# Patient Record
Sex: Male | Born: 1940 | Race: Black or African American | Hispanic: No | Smoking: Current every day smoker
Health system: Southern US, Community
[De-identification: ages and names within clinical notes are randomized; demographics above are authoritative.]

## PROBLEM LIST (undated history)

## (undated) DIAGNOSIS — R918 Other nonspecific abnormal finding of lung field: Secondary | ICD-10-CM

## (undated) DIAGNOSIS — K219 Gastro-esophageal reflux disease without esophagitis: Secondary | ICD-10-CM

## (undated) DIAGNOSIS — K3189 Other diseases of stomach and duodenum: Secondary | ICD-10-CM

## (undated) DIAGNOSIS — R42 Dizziness and giddiness: Secondary | ICD-10-CM

## (undated) DIAGNOSIS — E119 Type 2 diabetes mellitus without complications: Secondary | ICD-10-CM

## (undated) DIAGNOSIS — I2510 Atherosclerotic heart disease of native coronary artery without angina pectoris: Secondary | ICD-10-CM

## (undated) DIAGNOSIS — R0789 Other chest pain: Secondary | ICD-10-CM

## (undated) DIAGNOSIS — G459 Transient cerebral ischemic attack, unspecified: Secondary | ICD-10-CM

## (undated) DIAGNOSIS — I10 Essential (primary) hypertension: Secondary | ICD-10-CM

## (undated) DIAGNOSIS — Z972 Presence of dental prosthetic device (complete) (partial): Secondary | ICD-10-CM

## (undated) DIAGNOSIS — J449 Chronic obstructive pulmonary disease, unspecified: Secondary | ICD-10-CM

## (undated) HISTORY — PX: EYE SURGERY: SHX253

---

## 2003-12-01 ENCOUNTER — Other Ambulatory Visit: Payer: Self-pay

## 2007-01-16 DIAGNOSIS — E0590 Thyrotoxicosis, unspecified without thyrotoxic crisis or storm: Secondary | ICD-10-CM | POA: Insufficient documentation

## 2009-03-12 ENCOUNTER — Inpatient Hospital Stay: Payer: Self-pay | Admitting: Internal Medicine

## 2010-01-09 ENCOUNTER — Inpatient Hospital Stay: Payer: Self-pay | Admitting: Internal Medicine

## 2010-06-01 ENCOUNTER — Emergency Department: Payer: Self-pay | Admitting: Emergency Medicine

## 2010-12-12 ENCOUNTER — Observation Stay: Payer: Self-pay | Admitting: Internal Medicine

## 2011-06-02 ENCOUNTER — Observation Stay: Payer: Self-pay | Admitting: Internal Medicine

## 2011-06-23 ENCOUNTER — Ambulatory Visit: Payer: Self-pay | Admitting: Internal Medicine

## 2012-08-04 ENCOUNTER — Emergency Department: Payer: Self-pay | Admitting: Unknown Physician Specialty

## 2012-08-04 LAB — COMPREHENSIVE METABOLIC PANEL
Anion Gap: 4 — ABNORMAL LOW (ref 7–16)
BUN: 19 mg/dL — ABNORMAL HIGH (ref 7–18)
Bilirubin,Total: 0.2 mg/dL (ref 0.2–1.0)
Chloride: 102 mmol/L (ref 98–107)
Co2: 30 mmol/L (ref 21–32)
Creatinine: 1.45 mg/dL — ABNORMAL HIGH (ref 0.60–1.30)
EGFR (African American): 56 — ABNORMAL LOW
EGFR (Non-African Amer.): 48 — ABNORMAL LOW
Osmolality: 276 (ref 275–301)
Potassium: 3.2 mmol/L — ABNORMAL LOW (ref 3.5–5.1)
Total Protein: 6.6 g/dL (ref 6.4–8.2)

## 2012-08-04 LAB — CBC
HCT: 41.4 % (ref 40.0–52.0)
MCHC: 31.6 g/dL — ABNORMAL LOW (ref 32.0–36.0)
MCV: 82 fL (ref 80–100)
Platelet: 146 10*3/uL — ABNORMAL LOW (ref 150–440)
RBC: 5.03 10*6/uL (ref 4.40–5.90)
RDW: 13.9 % (ref 11.5–14.5)
WBC: 14.3 10*3/uL — ABNORMAL HIGH (ref 3.8–10.6)

## 2012-08-04 LAB — TROPONIN I: Troponin-I: 0.03 ng/mL

## 2013-04-25 ENCOUNTER — Observation Stay: Payer: Self-pay | Admitting: Internal Medicine

## 2013-04-25 LAB — CBC
HCT: 42.2 % (ref 40.0–52.0)
RBC: 5.22 10*6/uL (ref 4.40–5.90)

## 2013-04-25 LAB — COMPREHENSIVE METABOLIC PANEL
Anion Gap: 8 (ref 7–16)
Bilirubin,Total: 0.5 mg/dL (ref 0.2–1.0)
Calcium, Total: 8.8 mg/dL (ref 8.5–10.1)
Co2: 27 mmol/L (ref 21–32)
Creatinine: 1.9 mg/dL — ABNORMAL HIGH (ref 0.60–1.30)
EGFR (Non-African Amer.): 35 — ABNORMAL LOW
Glucose: 123 mg/dL — ABNORMAL HIGH (ref 65–99)
SGOT(AST): 18 U/L (ref 15–37)
SGPT (ALT): 12 U/L (ref 12–78)
Sodium: 138 mmol/L (ref 136–145)
Total Protein: 7.1 g/dL (ref 6.4–8.2)

## 2013-04-25 LAB — TROPONIN I: Troponin-I: 0.06 ng/mL — ABNORMAL HIGH

## 2013-04-25 LAB — CK TOTAL AND CKMB (NOT AT ARMC): CK, Total: 166 U/L (ref 35–232)

## 2013-04-26 LAB — CK TOTAL AND CKMB (NOT AT ARMC)
CK, Total: 159 U/L (ref 35–232)
CK-MB: 2 ng/mL (ref 0.5–3.6)
CK-MB: 1.4 ng/mL (ref 0.5–3.6)

## 2013-04-26 LAB — LIPID PANEL
Cholesterol: 107 mg/dL (ref 0–200)
Ldl Cholesterol, Calc: 50 mg/dL (ref 0–100)
Triglycerides: 100 mg/dL (ref 0–200)

## 2013-04-26 LAB — BASIC METABOLIC PANEL
BUN: 25 mg/dL — ABNORMAL HIGH (ref 7–18)
Calcium, Total: 9.1 mg/dL (ref 8.5–10.1)
Chloride: 102 mmol/L (ref 98–107)
Co2: 28 mmol/L (ref 21–32)
Creatinine: 1.47 mg/dL — ABNORMAL HIGH (ref 0.60–1.30)
EGFR (African American): 55 — ABNORMAL LOW
Glucose: 95 mg/dL (ref 65–99)
Osmolality: 278 (ref 275–301)
Potassium: 3.5 mmol/L (ref 3.5–5.1)
Sodium: 137 mmol/L (ref 136–145)

## 2013-04-26 LAB — CBC WITH DIFFERENTIAL/PLATELET
Basophil #: 0.1 10*3/uL (ref 0.0–0.1)
HGB: 14.3 g/dL (ref 13.0–18.0)
Lymphocyte %: 28 %
MCH: 27.1 pg (ref 26.0–34.0)
MCHC: 33.9 g/dL (ref 32.0–36.0)
Monocyte #: 0.8 x10 3/mm (ref 0.2–1.0)
Monocyte %: 8.9 %
Neutrophil #: 5.2 10*3/uL (ref 1.4–6.5)
Neutrophil %: 58.1 %
Platelet: 151 10*3/uL (ref 150–440)
WBC: 8.9 10*3/uL (ref 3.8–10.6)

## 2013-04-26 LAB — HEMOGLOBIN A1C: Hemoglobin A1C: 7.5 % — ABNORMAL HIGH (ref 4.2–6.3)

## 2013-04-26 LAB — TROPONIN I: Troponin-I: 0.07 ng/mL — ABNORMAL HIGH

## 2013-10-15 ENCOUNTER — Observation Stay: Payer: Self-pay | Admitting: Internal Medicine

## 2013-10-15 LAB — CBC
HCT: 45.6 % (ref 40.0–52.0)
HGB: 14.5 g/dL (ref 13.0–18.0)
MCH: 26.4 pg (ref 26.0–34.0)
MCHC: 31.9 g/dL — ABNORMAL LOW (ref 32.0–36.0)
MCV: 83 fL (ref 80–100)
Platelet: 164 10*3/uL (ref 150–440)
RBC: 5.51 10*6/uL (ref 4.40–5.90)
RDW: 14.1 % (ref 11.5–14.5)
WBC: 14 10*3/uL — AB (ref 3.8–10.6)

## 2013-10-15 LAB — DIFFERENTIAL
Basophil #: 0.1 10*3/uL (ref 0.0–0.1)
Basophil %: 0.8 %
EOS ABS: 0.3 10*3/uL (ref 0.0–0.7)
EOS PCT: 2.5 %
Lymphocyte #: 4.6 10*3/uL — ABNORMAL HIGH (ref 1.0–3.6)
Lymphocyte %: 32.8 %
MONO ABS: 1.1 x10 3/mm — AB (ref 0.2–1.0)
Monocyte %: 7.5 %
Neutrophil #: 7.9 10*3/uL — ABNORMAL HIGH (ref 1.4–6.5)
Neutrophil %: 56.4 %

## 2013-10-15 LAB — BASIC METABOLIC PANEL
ANION GAP: 4 — AB (ref 7–16)
BUN: 20 mg/dL — AB (ref 7–18)
Calcium, Total: 8.8 mg/dL (ref 8.5–10.1)
Chloride: 99 mmol/L (ref 98–107)
Co2: 29 mmol/L (ref 21–32)
Creatinine: 1.83 mg/dL — ABNORMAL HIGH (ref 0.60–1.30)
EGFR (Non-African Amer.): 36 — ABNORMAL LOW
GFR CALC AF AMER: 42 — AB
Glucose: 138 mg/dL — ABNORMAL HIGH (ref 65–99)
Osmolality: 269 (ref 275–301)
Potassium: 3 mmol/L — ABNORMAL LOW (ref 3.5–5.1)
SODIUM: 132 mmol/L — AB (ref 136–145)

## 2013-10-15 LAB — CK TOTAL AND CKMB (NOT AT ARMC)
CK, TOTAL: 173 U/L (ref 35–232)
CK-MB: 2.4 ng/mL (ref 0.5–3.6)

## 2013-10-15 LAB — TROPONIN I: Troponin-I: 0.1 ng/mL — ABNORMAL HIGH

## 2013-10-16 LAB — BASIC METABOLIC PANEL
ANION GAP: 8 (ref 7–16)
BUN: 21 mg/dL — AB (ref 7–18)
CO2: 24 mmol/L (ref 21–32)
CREATININE: 1.43 mg/dL — AB (ref 0.60–1.30)
Calcium, Total: 8.3 mg/dL — ABNORMAL LOW (ref 8.5–10.1)
Chloride: 101 mmol/L (ref 98–107)
EGFR (African American): 57 — ABNORMAL LOW
EGFR (Non-African Amer.): 49 — ABNORMAL LOW
Glucose: 133 mg/dL — ABNORMAL HIGH (ref 65–99)
OSMOLALITY: 271 (ref 275–301)
POTASSIUM: 4.2 mmol/L (ref 3.5–5.1)
SODIUM: 133 mmol/L — AB (ref 136–145)

## 2013-10-16 LAB — URINALYSIS, COMPLETE
Bilirubin,UR: NEGATIVE
GLUCOSE, UR: NEGATIVE mg/dL (ref 0–75)
KETONE: NEGATIVE
Leukocyte Esterase: NEGATIVE
Nitrite: NEGATIVE
Ph: 6 (ref 4.5–8.0)
Protein: NEGATIVE
RBC,UR: 3 /HPF (ref 0–5)
Specific Gravity: 1.015 (ref 1.003–1.030)
Squamous Epithelial: 1

## 2013-10-16 LAB — LIPID PANEL
CHOLESTEROL: 96 mg/dL (ref 0–200)
HDL Cholesterol: 30 mg/dL — ABNORMAL LOW (ref 40–60)
Ldl Cholesterol, Calc: 50 mg/dL (ref 0–100)
Triglycerides: 81 mg/dL (ref 0–200)
VLDL Cholesterol, Calc: 16 mg/dL (ref 5–40)

## 2013-10-16 LAB — TROPONIN I
TROPONIN-I: 0.09 ng/mL — AB
TROPONIN-I: 0.1 ng/mL — AB

## 2013-10-16 LAB — CK-MB
CK-MB: 2.6 ng/mL (ref 0.5–3.6)
CK-MB: 1.9 ng/mL (ref 0.5–3.6)

## 2013-10-16 LAB — TSH: Thyroid Stimulating Horm: 2.05 u[IU]/mL

## 2014-02-13 DIAGNOSIS — N138 Other obstructive and reflux uropathy: Secondary | ICD-10-CM | POA: Insufficient documentation

## 2014-03-03 DIAGNOSIS — R911 Solitary pulmonary nodule: Secondary | ICD-10-CM | POA: Insufficient documentation

## 2014-05-05 DIAGNOSIS — I951 Orthostatic hypotension: Secondary | ICD-10-CM | POA: Insufficient documentation

## 2014-05-05 DIAGNOSIS — N189 Chronic kidney disease, unspecified: Secondary | ICD-10-CM | POA: Insufficient documentation

## 2014-07-01 DIAGNOSIS — I6523 Occlusion and stenosis of bilateral carotid arteries: Secondary | ICD-10-CM | POA: Insufficient documentation

## 2014-11-14 DIAGNOSIS — E1122 Type 2 diabetes mellitus with diabetic chronic kidney disease: Secondary | ICD-10-CM | POA: Insufficient documentation

## 2014-11-14 DIAGNOSIS — N182 Chronic kidney disease, stage 2 (mild): Secondary | ICD-10-CM | POA: Insufficient documentation

## 2014-11-23 DIAGNOSIS — R351 Nocturia: Secondary | ICD-10-CM | POA: Insufficient documentation

## 2014-12-06 ENCOUNTER — Emergency Department: Payer: Self-pay | Admitting: Emergency Medicine

## 2015-01-09 NOTE — Consult Note (Signed)
PATIENT NAMEWORLEY, [name] MR#:  [id number] DATE OF BIRTH:  [birth year]  DATE OF CONSULTATION:  04/26/2013  REFERRING PHYSICIAN:  Dr. Posey Pronto CONSULTING PHYSICIAN:  Corey Skains, MD  REASON FOR CONSULTATION: Chest pain consistent with unstable angina with diabetes,  coronary artery disease, hypertension and hyperlipidemia.   CHIEF COMPLAINT: "I got weak and fatigued with dizziness."   HISTORY OF PRESENT ILLNESS:  This is a 74 year old male with known hypertension on appropriate medication management as well as hyperlipidemia on a statin. The patient has had diabetes, currently treated, although the concerns of significant compliance. The patient does work very hard and was working through lunch yesterday when he had weakness, fatigue, dizziness and nausea.  When getting home, he continued to be weak and dizzy and had some mild chest pressure. With that chest pressure, he had concerns of coronary artery disease that he has had in the past. When coming to the hospital, he had an EKG showing normal sinus rhythm with nonspecific ST and T wave changes.  There is currently no evidence of elevation of troponin or acute myocardial infarction.  The patient has had continued blood pressure and heart rate control and no worsening of chest discomfort at this time. He has been placed on appropriate medication management for hypertension and diabetes. At this time, the patient's review of systems are negative for syncope, dizziness, nausea, diaphoresis, vision change, ringing in the ears, hearing loss, cough, congestion, heartburn, further nausea, skin lesions, skin rashes, cough, congestion or lower extremity edema.   PAST MEDICAL HISTORY:  1.  Diabetes.  2.  Hypertension.  3.  Hyperlipidemia.  4.  Coronary artery disease.  5.  Lower extremity edema.   FAMILY HISTORY: No family members with early onset of cardiovascular disease or hypertension.   SOCIAL HISTORY: Smokes 1/2 pack per day and denies alcohol  use.   ALLERGIES:  As listed.   MEDICATIONS: As listed.   PHYSICAL EXAMINATION: VITAL SIGNS: Blood pressure is 132/68 bilaterally and heart rate 72 upright and reclining, and regular.  GENERAL: He is a well appearing male in no acute distress.  HEENT: No icterus, thyromegaly, ulcers, hemorrhage or xanthelasma.  HEART:  Regular rate and rhythm with normal S1 and S2 without murmur, gallop or rub. PMI is normal size and placement. Carotid upstroke normal without bruit. Jugular venous pressure is normal.  LUNGS: Clear to auscultation with normal respirations.  ABDOMEN: Soft and nontender without hepatosplenomegaly or masses. Abdominal aorta is normal size without bruit.  EXTREMITIES: 2+ bilateral pulses in dorsal, pedal, radial and femoral arteries without lower extremity edema, cyanosis, clubbing or ulcers.  NEUROLOGIC: He is oriented to time, place and person with normal mood and affect.   ASSESSMENT: This is a 74 year old male with diabetes, hypertension, hyperlipidemia, coronary artery disease and tobacco abuse of which he had new onset of symptoms consistent with unstable angina, currently without evidence of myocardial infarction.   RECOMMENDATIONS: 1.  Continue serial ECG and enzymes to assess for possible myocardial infarction.  2.  If no myocardial infarction, would perform treadmill Myoview to assess exercise tolerance, myocardial ischemia and Canadian class III angina.  3.  Continue aspirin for further risk reduction of coronary artery disease. 4.  Nitroglycerin for further treatment options for chest discomfort.  5.  Echocardiogram for LV systolic dysfunction and valvular heart disease contributing to chest pain and other symptoms.  6.  Diabetes control with a goal Hemoglobin A1c below 7.  7.  Statin therapy for coronary artery disease  and high intensity cholesterol therapy.  8.  Adjustments of medications as necessary after ambulation.  9.  The patient was counseled on the  discontinuation of tobacco abuse. 10.  Further treatment options after above. ____________________________ Corey Skains, MD bjk:sb D: 04/26/2013 08:08:40 ET T: 04/26/2013 08:50:18 ET JOB#: [id number]  cc: Corey Skains, MD, <Dictator> Corey Skains MD ELECTRONICALLY SIGNED 04/26/2013 14:08

## 2015-01-09 NOTE — Discharge Summary (Signed)
PATIENT NAMEZEDRICK, [name] MR#:  [id number] DATE OF BIRTH:  [birth year]  DATE OF ADMISSION:  04/25/2013 DATE OF DISCHARGE:  04/26/2013  PRESENTING COMPLAINT: Chest pain.   DISCHARGE DIAGNOSES:  1. Chest pain, resolved.  2. Heat exhaustion.  3. Hypertension.  4. Type 2 diabetes.   PROCEDURE: Myoview stress test negative for ischemia.   CODE STATUS: Full code.   MEDICATIONS:  1. Metformin 500 b.i.d.  2. ProAir HFA 1 puff as needed. 3. Symbicort 160/4.5 two puffs b.i.d.  4. Norvasc 5 mg p.o. daily.  5. Tramadol 50 mg b.i.d.  6. Acetaminophen/hydrocodone 1 tablet 3 times a day as needed.  7. Hydrochlorothiazide/lisinopril 25/20 one tablet daily.  8. Lovastatin 40 mg daily.  9. Spiriva 18 mcg inhalation once a day.  10. Omeprazole 20 mg daily.  11. Aspirin 81 mg daily.   FOLLOWUP: With your [facility] primary care physician in 1 to 2 weeks.   LABORATORY DATA: Cardiac enzymes: Troponin was 0.05, 0.07, 0.06.  Hemoglobin A1c is 7.5.  Creatinine is 1.47.  CBC within normal limits.   CARDIOLOGY CONSULTATION: Dr. Nehemiah Massed.   BRIEF SUMMARY OF HOSPITAL COURSE: [name] is a 74 year old African-American gentleman with history of diffuse minimal coronary artery disease, hypertension and diabetes, who came in with chest pain and dehydration and was admitted with:   1. Chest pain. The patient has risk factors for coronary artery disease with hypertension and diabetes. His troponin was borderline elevated. No acute EKG changes were noted. Myoview stress test was essentially negative for ischemia. His aspirin, lisinopril and statins were continued. The patient reports he does not take atenolol and did not know the reason why it was discontinued.  2. Hypertension. Stable with lisinopril and Norvasc.  3. Acute on chronic renal failure, appeared secondary to heat exhaustion from working out in the yard for about 8 hours without adequate hydration per patient. The patient's symptoms remarkably improved  after IV fluids were administered.  4. Type 2 diabetes. Resumed metoprolol.  5. Hyperlipidemia. Continued lovastatin.  6. Hospital stay otherwise remained stable.   CODE STATUS: The patient remained a full code.   TIME SPENT: 40 minutes.   ____________________________ Hart Rochester Posey Pronto, MD sap:OSi D: 04/27/2013 06:48:57 ET T: 04/27/2013 07:13:35 ET JOB#: [id number]  cc: Lexany Belknap A. Posey Pronto, MD, <Dictator> [facility] Ilda Basset MD ELECTRONICALLY SIGNED 05/10/2013 5:40

## 2015-01-09 NOTE — H&P (Signed)
PATIENT NAMERYLIE, [name] MR#:  [id number] DATE OF BIRTH:  [birth year]  PRIMARY CARE PHYSICIAN: Dr. Lia F[facility] at [facility].   PRIMARY CARDIOLOGIST: Dr. Clayborn Bigness.   CHIEF COMPLAINT: Chest pain.   REFERRING EMERGENCY ROOM PHYSICIAN: Dr. Thomasene Lot.   HISTORY OF PRESENT ILLNESS: The patient is a 74 year old male with past medical history of: 1.  Diffuse coronary artery disease, minimal.  2.  Chronic obstructive pulmonary disease.  3.  Hypertension.  4.  Hyperlipidemia.  5.  Diabetes.  6.  Hypothyroid.   He was in his usual state of health still today. Today he had eight hours of work which is outdoors and which was in hot weather without eating or drinking enough. In the evening, he felt very weak and tired and he got some rest, but he was still feeling chest pain and was feeling shortness of breath. The pain was on the left side, which was nonradiating, 6 out of 10,  constant, and there was no cough or anything which was making it worse. He decided to come to the Emergency Room, and in the ER after getting some IV fluid, his pain was slightly better. Does not have any pain now. He denies any similar episode in the past. Denies any palpitation or leg edema.   REVIEW OF SYSTEMS:  CONSTITUTIONAL: Negative for fever, fatigue, weakness, pain or weight loss.  EYES: No blurring, double vision, pain or redness.  ENT:  No tinnitus, ear pain, hearing loss or respiratory problems.  RESPIRATORY: Denies any cough, wheezing, hemoptysis.  CARDIOVASCULAR: Denies any orthopnea, edema, arrhythmia, palpitation, but had chest pain.  GASTROINTESTINAL: Did not have nausea, vomiting, diarrhea or abdominal pain.  GENITOURINARY: No dysuria, hematuria or increased frequency.  ENDOCRINE: No heat or cold intolerance.  MUSCULOSKELETAL: No pain or swelling in the joints.  NEUROLOGICAL: No numbness, weakness, dysarthria or tremors.  PSYCHIATRIC: No anxiety, insomnia or bipolar disorder.   PAST MEDICAL HISTORY:  1.  Minimal  diffuse coronary artery disease. No stent. He had cardiac catheterization in the past.  2.  Chronic obstructive pulmonary disease. No need of home oxygen.  3.  Hypertension. Now for the last six months, blood pressure is coming down, and so his primary care is cutting down the blood pressure medication.  4.  Hyperlipidemia.  5.  Diabetes.  6.  Hypothyroidism.   PAST SURGICAL HISTORY: Cardiac catheterization.   SOCIAL HISTORY: He is a current smoker, 5 to 10 cigarettes per day. Denies alcohol use or any drug abuse.   FAMILY HISTORY: Negative for coronary artery disease, stroke or diabetes.   HOME MEDICATIONS:  1.  Tramadol 50 mg two times a day.  2.  ProAir HFA once a day as needed.  3.  Norvasc 5 mg once a day.  4.  Nitroglycerin 0.4 mg sublingual tablet every five minutes. Take 3 times for chest pain.  5.  Metformin 500 mg one tablet orally 2 times a day.  6.  Lovastatin 40 mg once a day.  7.  Lisinopril 20 mg once a day.  8.  Atenolol 75 mg once a day.  9.  Aspirin 81 mg once a day.  10.  Advair Diskus 1 puff two times a day.   PHYSICAL EXAMINATION:  VITAL SIGNS: In the ER, temperature 97.6, pulse rate 78, respirations 20, blood pressure 109/69 and pulse of 99 on room air.  GENERAL: The patient is fully alert and oriented to time, place and person and does not appear in any acute distress.  HEENT: Head and neck atraumatic. Conjunctivae pink. Oral mucosa moist.  NECK: Supple. No JVD.  RESPIRATORY: Bilateral clear and equal air entry.  CARDIOVASCULAR: S1, S2 present, regular. No murmur.  ABDOMEN: Soft, nontender. Bowel sounds present. No organomegaly.  SKIN: No rashes.  EXTREMITIES: Legs: No swelling or edema. Joints: No swelling or tenderness.  NEUROLOGICAL: Power 5/5. Follows commands. No gross abnormalities.   IMPORTANT LABORATORY RESULTS: Glucose 123; BUN 21; creatinine 1.9, which was 1.45 one year ago; sodium 138; potassium 3.4; chloride 103; CO2 of 27; calcium 8.8; troponin  0.06. WBC 11.1, hemoglobin 14.1, platelet count 149 and MCV 81.   ASSESSMENT AND PLAN: A 74 year old male with past history of diffuse minimal coronary artery disease, came with chest pain and appears having slightly dehydrated, acute renal failure.  1.  Chest pain. Monitor on telemetry floor for observation, troponin, aspirin, cardiology consult and echocardiogram.  2.  Acute-on-chronic renal failure. Gentle hydration and monitor.  3.  Hypertension. Currently stable. Will hold lisinopril hydrochlorothiazide due to his renal failure.  4.  Diabetes. Will hold metformin due to renal failure. We will continue insulin sliding scale coverage.  5.  Hyperlipidemia. We will continue lovastatin.  6.  Current smoker. Counseling done for five minutes and nicotine patch offered in the hospital.   TOTAL TIME SPENT: 50 minutes.   CODE STATUS: FULL CODE.     ____________________________ Ceasar Lund Anselm Jungling, MD vgv:np D: 04/25/2013 21:48:56 ET T: 04/25/2013 22:22:24 ET JOB#: [id number]  cc: Ceasar Lund. Anselm Jungling, MD, <Dictator> Vaughan Basta MD ELECTRONICALLY SIGNED 05/07/2013 11:14

## 2015-01-10 NOTE — Discharge Summary (Signed)
PATIENT NAMESHAMMOND, [name] MR#:  [id number] DATE OF BIRTH:  1940-11-25  DATE OF ADMISSION:  10/15/2013 DATE OF DISCHARGE:  10/16/2013  ADMISSION DIAGNOSIS:  Syncope.   DISCHARGE DIAGNOSES: 1.  Presyncope secondary to hypotension.  2.  Elevated troponin, likely due to demand ischemia from hypotension not acute coronary syndrome.  3.  History of hypertension.  4.  Hyponatremia from hydrochlorothiazide. 5.  Acute kidney injury.   CONSULTATIONS:  Cardiology.  IMAGING: 1.  The patient had a carotid ultrasound which showed no hemodynamically significant stenosis. 2.  2-D echocardiogram showed an EF of 60% to 65% without any valvular abnormalities.  TSH was 2.05. Troponin max was 0.10. Troponin at discharge 0.09. Sodium 133, potassium 4.2, chloride 101, bicarb 24, BUN 21, creatinine 1.43 glucose is 133.   HOSPITAL COURSE: This 74 year old male who presented with presyncope. For further details, please refer to the H and P.  1.  Near syncope with lightheadedness and queasiness, likely secondary to vasovagal reaction versus hypovolemia. I suspect this is related more to hypovolemia as the patient had acute kidney injury as well as hyponatremia from his HCTZ, less likely cardiogenic in nature. He hypotensive, which did precipitate his near syncope. His blood pressure is improved. He is off of IV fluids and doing well. His Dopplers showed no significant abnormality. Cardiology was consulted. No further assessment at this time. 2.  Elevated troponin secondary to demand ischemia from hypotension not acute coronary syndrome.  Cardiology consult appreciated. Echocardiogram showed no wall motion abnormalities with a normal ejection fraction. Had a normal stress test in August.  3.  Hypertension, initially hypotensive. Now his blood pressure is elevated. We do not recommend starting HCTZ; however, we did start lisinopril. His creatinine is 1.43, which his BMP should be monitored closely on lisinopril.  4.   Hyponatremia from HCTZ, which is resolving. This is also secondary to hypovolemia.  5.  Acute kidney injury from HCTZ hypovolemia which is resolving.   DISCHARGE MEDICATIONS: 1.  Aspirin 81 mg daily. 2.  Spiriva 18 mcg daily.  3.  Symbicort 2 puffs b.i.d.  4.  Lovastatin 40 mg daily.  5.  Metformin 500 b.i.d.  6.  Omeprazole 20 mg b.i.d.  7.  ProAir 2 puffs 4 times a day.  8.  Lisinopril 20 mg daily.   DISCHARGE DIET: ADA diet, low sodium.   DISCHARGE ACTIVITY: As tolerated.  DISCHARGE FOLLOWUP:  The patient will follow up next week with Paulita Cradle for followup for BMP.     The patient is medically stable for discharge. It should be noted his GFR at discharge is 57, which is tolerable for lisinopril as well as metformin.    ____________________________ Madiline Saffran P. Benjie Karvonen, MD spm:ce D: 10/16/2013 15:05:52 ET T: 10/16/2013 19:00:57 ET JOB#: [id number]  cc: Kordelia Severin P. Benjie Karvonen, MD, <Dictator> Paulita Cradle, PA-C Shenekia Riess P Tyler Robidoux MD ELECTRONICALLY SIGNED 10/18/2013 15:04

## 2015-01-10 NOTE — H&P (Signed)
PATIENT NAMESUMNER, [name] MR#:  [id number] DATE OF BIRTH:  05-27-1941  DATE OF ADMISSION:  10/15/2013  PRIMARY CARE PHYSICIAN: Paulita Cradle, PA-C.   REFERRING PHYSICIAN: Dr. Robet Leu.   CHIEF COMPLAINT: Syncope.   HISTORY OF PRESENT ILLNESS: [name] is a 74 year old African American male with a past medical history of hypertension, hyperlipidemia, diabetes mellitus, COPD. 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 Sat down. Again, when he stood up, continued to have similar symptoms. Concerning this, walked out to the car. The patient continued to have severe symptoms of severe dizziness. The patient's wife walked to him and was concerned about this. Called EMS. At the time of EMS arrival, the patient was found to have a systolic blood pressure in 60s. The patient states the patient had symptoms until the patient came to the Emergency Department and was given 1 liter of IV fluids. The patient responded well to the 1 liter of IV fluids with improvement of the blood pressure to systolic blood pressure to 120s. Denies having any chest pain, palpitations or skipped beats. Denied having previous episodes. The patient denies having any lightheadedness upon standing up. The patient is on hydrochlorothiazide. The patient also stated that was experiencing chest pain on the left side of the chest. The patient was admitted in August 2014 with chest pain. At that time, underwent a stress test which came back to be negative. The patient also states that had left heart cath by Dr. Clayborn Bigness. Could not find the report of it.   PAST MEDICAL HISTORY:  1. Hypertension.  2. Hyperlipidemia.  3. Coronary artery disease, diffuse, minimal.  4. COPD.  5. Diabetes.  6. Hypothyroidism  7. History of vertigo.  8. TIA.  9. Gastroesophageal reflux disease.   PAST SURGICAL HISTORY: Left eye surgery.   ALLERGIES: PERCOCET.   HOME MEDICATIONS:  1.  Symbicort 2 puffs 2 times a day.  2. Spiriva 18 mcg once a day.  3. ProAir 2 puffs 4 times a day.  4. Omeprazole 20 mg 2 times a day.  5. Metformin 500 mg 2 times a day.  6. Lovastatin 40 mg once a day.  7. Hydrochlorothiazide 25 mg once a day.  8. Aspirin 81 mg once a day.   SOCIAL HISTORY: Smokes 1/2 pack a day. Denies drinking alcohol or using illicit drugs. Married, lives with his wife.   FAMILY HISTORY: Negative for cardiovascular disease and stroke.   REVIEW OF SYSTEMS:  CONSTITUTIONAL: Denies any generalized weakness.  EYES: No change in vision.  ENT: No change in hearing. No sore throat, runny nose.  RESPIRATORY: Has some mild cough. No shortness of breath.  CARDIOVASCULAR: Chest pain, currently resolved.  GASTROINTESTINAL: No nausea, vomiting, abdominal pain.  GENITOURINARY: No dysuria or hematuria.  ENDOCRINE: No polyuria or polydipsia.  SKIN: No rash or lesions.  NEUROLOGIC: No weakness or numbness in any part of the body.   PHYSICAL EXAMINATION:  GENERAL: This is a well-built, well-nourished, age-appropriate male lying down in the bed, not in distress.  VITAL SIGNS: Temperature is 97.3, pulse 94, blood pressure 129/69, respiratory rate of 18, oxygen saturation 94% on room air.  HEENT: Head normocephalic, atraumatic. There is no scleral icterus. Conjunctivae normal. Pupils equal and react to light. Extraocular movements are intact. Mucous membranes moist. No pharyngeal erythema.  NECK: Supple. No lymphadenopathy. No JVD. No carotid bruit.  CHEST: Has no focal tenderness.  LUNGS: Bilaterally clear to auscultation.  HEART:  S1, S2 regular. No murmurs are heard. No pedal edema. Pulses 2+.  ABDOMEN: Bowel sounds present. Soft, nontender, nondistended. No hepatosplenomegaly.  SKIN: No rash or lesions.  MUSCULOSKELETAL: Good range of motion in all of the extremities.  NEUROLOGIC: The patient is alert, oriented to place, person and time. Cranial nerves II through XII intact.  Motor 5/5 in upper and lower extremities.   LABORATORIES: Initial troponin 0.10. CK-MB 2.6. BMP: BUN 20, creatinine of 1.83, potassium of 3.   CBC: WBC of 14,000, hemoglobin 14.5. The rest of all of the values are within normal limits.   ASSESSMENT AND PLAN: [name] comes to the Emergency Department after having an episode of syncope, chest pain.  1. Chest pain: Multiple risk factors, age, male gender, continued smoking, hypertension, diabetes mellitus. Admit the patient to a monitored bed. Continue to cycle cardiac enzymes x 3. The patient has a minimal CK-MB. Consider consulting cardiology.  2. Syncope with hypotension: The differential diagnosis orthostatic blood pressure versus arrhythmias versus right-sided myocardial infarction. Will continue with intravenous fluids. Monitor the patient and cycle cardiac enzymes x 3. Will obtain orthostatic blood pressure.  3. Acute renal failure: This could be secondary to acute tubular necrosis. Continue with intravenous fluids and follow up.  4. Hypokalemia: This is from hydrochlorothiazide. Will replace by mouth.  5. Hypotension: Will hold the hydrochlorothiazide. Continue with the losartan.  6. Diabetes mellitus: Will hold the metformin for now. Continue with sliding scale insulin.  7. Continued tobacco use: Counseled with the patient.  8. Chronic obstructive pulmonary disease: No current issues.  9. Keep the patient on deep venous thrombosis prophylaxis with Lovenox.   TIME SPENT: 50 minutes.   ____________________________ Monica Becton, MD pv:gb D: 10/16/2013 03:49:16 ET T: 10/16/2013 04:40:02 ET JOB#: [id number]  cc: Monica Becton, MD, <Dictator> Paulita Cradle, PA-C [facility] Mylik Pro MD ELECTRONICALLY SIGNED 10/27/2013 4:04

## 2015-01-10 NOTE — Consult Note (Signed)
General Aspect 75 year old male that was admitted for near syncope.  States he was at [employer] in [employer].  He tried several times to stand up, got very lightheaded.  He was brought to the emergency room where he was found to have a low blood pressure around 60 systolic.  He received IV fluids and felt better.  Troponin initially elevated at 0.10, ast a troponin of 0.90. EKG negative for acute ST changes.In her  This is thought to represent demand ischemia from hypotension and not acute coronary syndrome.  Patient describes some vague chest pain when he was having the near syncopal episodes.  However, he has not had any further chest pain and feels well currently.  He was on hydrochlorothiazide and this has been held while he is in the hospital.  His creatinine was elevated at 1.83 after IV fluids now improved to 1.4.  White count also elevated at 14, although there's no clinical signs of infection.  Patient states he  has been taking Alka-Seltzer +1-2 a day since early December to prevent flu and colds.Surface echocardiogram basically shows normal structure and function.   Physical Exam:  GEN well developed, no acute distress   HEENT pink conjunctivae   RESP normal resp effort  clear BS   CARD Regular rate and rhythm  No murmur   ABD denies tenderness  soft   EXTR negative edema   SKIN normal to palpation, skin turgor decreased   NEURO cranial nerves intact, motor/sensory function intact   PSYCH alert, A+O to time, place, person, good insight   Review of Systems:  Subjective/Chief Complaint Lightheaded, dizzy, improved   Neurologic: Dizzness  Near syncope   Review of Systems: All other systems were reviewed and found to be negative   Lab Results: Thyroid:  28-Jan-15 05:08   Thyroid Stimulating Hormone 2.05 (0.45-4.50 (International Unit)  ----------------------- Pregnant patients have  different reference  ranges for TSH:  - - - - - - - - - -  Pregnant, first  trimetser:  0.36 - 2.50 uIU/mL)  LabObservation:  28-Jan-15 09:10   OBSERVATION Reason for Test  Cardiology:  28-Jan-15 09:10   Echo Doppler REASON FOR EXAM:     COMMENTS:     PROCEDURE: [facility] - ECHO DOPPLER COMPLETE(TRANSTHOR)  - Oct 16 2013  9:10AM   RESULT: Echocardiogram Report  Patient Name:   [name] Date of Exam: 10/16/2013 Medical Rec #:  [id number]       Custom1: Date of Birth:  08/01/1941    Height:       67.0 in Patient Age:    77 years     Weight:       164.0 lb Patient Gender: M            BSA:          1.86 m??  Indications: Syncope Sonographer:    Sherrie Sport RDCS Referring Phys: Monica Becton  Summary:  1. Left ventricular ejection fraction, by visual estimation, is 60 to  65%. Normal wall motion.  2. Elevated mean left atrial pressure.  3. Impaired relaxation pattern of LV diastolic filling.  4. Normal right ventricular size and systolic function.  5. The left atrium is normal in size and structure.  6. The right atrium is normal in size and structure.  7. Normal aortic valve, without any evidence of aortic stenosis or  insufficiency.  8. The pulmonic valve is trileaflet, without evidence of stenosis or  insufficiency.  9. Mildly increased  left ventricular posterior wall thickness. 2D AND M-MODE MEASUREMENTS (normal ranges within parentheses): Left Ventricle:          Normal IVSd (2D):      1.21 cm (0.7-1.1) LVPWd (2D):1.17 cm (0.7-1.1) Aorta/LA:                  Normal LVIDd (2D):     4.20 cm (3.4-5.7) Aortic Root (2D): 3.20 cm (2.4-3.7) LVIDs (2D):     2.82 cm           Left Atrium (2D): 3.20 cm (1.9-4.0) LV FS (2D):     32.9 %   (>25%) LV EF (2D):     61.7 %  (>50%)                                   Right Ventricle:                                   RVd (2D):        0.81 cm LV DIASTOLIC FUNCTION: MV Peak E: 0.68 m/s E/e' Ratio: 11.80 MV Peak A: 1.10 m/s Decel Time: 197 msec E/A Ratio: 0.61 SPECTRAL DOPPLER ANALYSIS (where applicable): Mitral  Valve: MV P1/2 Time: 57.13 msec MV Area, PHT: 3.85 cm?? Aortic Valve: AoV Max Vel: 1.09 m/s AoV Peak PG: 4.8 mmHg AoV Mean PG: LVOT Vmax: 0.93 m/s LVOT VTI:  LVOT Diameter: 2.00 cm AoV Area, Vmax: 2.68 cm?? AoV Area, VTI:  AoV Area, Vmn: Tricuspid Valve and PA/RV Systolic Pressure: TR Max Velocity: 1.98 m/s RA  Pressure: 5 mmHg RVSP/PASP: 20.6 mmHg Pulmonic Valve: PV Max Velocity: 0.81 m/s PV Max PG: 2.6 mmHg PV Mean PG:  PHYSICIAN INTERPRETATION: Left Ventricle: The left ventricular internal cavity size was normal. LV  posterior wall thickness was mildly increased. Left ventricular ejection  fraction, by visual estimation, is 60 to 65%. Spectral Doppler shows   impaired relaxation pattern of LV diastolic filling. Elevated mean left  atrial pressure. Right Ventricle: Normal right ventricular size, wall thickness, and  systolic function. Left Atrium: The left atrium is normal in size and structure. Right Atrium: The right atrium is normal in size and structure. Pericardium: There is no evidence of pericardial effusion. Mitral Valve: Trace mitral valve regurgitation is seen. Tricuspid Valve: Trivial tricuspid regurgitation is visualized. The  tricuspid regurgitant velocity is 1.98 m/s, and with an assumed right  atrial pressure of 5 mmHg, the estimated right ventricular systolic  pressure is normal at 20.6 mmHg. Aortic Valve: The aortic valve is trileaflet and structurally normal,  with normal leaflet excursion; without any evidence of aortic stenosis or  insufficiency. Pulmonic Valve: Structurally normal pulmonic valve, with normal leaflet  excursion. Aorta: The aortic root is normal in size and structure.  [place] MD Electronically signed by [id number] Neoma Laming MD Signature Date/Time: 10/16/2013/11:37:00 AM  *** Final ***  IMPRESSION: .    Verified By: Emmit Pomfret. Humphrey Rolls, M.D., MD  Routine Chem:  28-Jan-15 00:47   Result Comment TROPONIN - RESULTS VERIFIED BY  REPEAT TESTING.  - PREVIOUS CALL:10/15/2013 AT 2128.Marland KitchenTPL  Result(s) reported on 16 Oct 2013 at 02:27AM.    05:08   Glucose, Serum  133  BUN  21  Creatinine (comp)  1.43  Sodium, Serum  133  Potassium, Serum 4.2  Chloride, Serum 101  CO2, Serum 24  Calcium (Total), Serum  8.3  Anion Gap 8  Osmolality (calc) 271  eGFR (African American)  57  eGFR (Non-African American)  49 (eGFR values <24m/min/1.73 m2 may be an indication of chronic kidney disease (CKD). Calculated eGFR is useful in patients with stable renal function. The eGFR calculation will not be reliable in acutely ill patients when serum creatinine is changing rapidly. It is not useful in  patients on dialysis. The eGFR calculation may not be applicable to patients at the low and high extremes of body sizes, pregnant women, and vegetarians.)  Result Comment TROPONIN - RESULTS VERIFIED BY REPEAT TESTING.  - PREVIOUS CALL:10/15/13 AT 2128..Marland KitchenPL  Result(s) reported on 16 Oct 2013 at 06:20AM.  Cholesterol, Serum 96  Triglycerides, Serum 81  HDL (INHOUSE)  30  VLDL Cholesterol Calculated 16  LDL Cholesterol Calculated 50 (Result(s) reported on 16 Oct 2013 at [facility])  Cardiac:  28-Jan-15 00:47   CPK-MB, Serum 2.6 (Result(s) reported on 16 Oct 2013 at 02:06AM.)  Troponin I  0.10 (0.00-0.05 0.05 ng/mL or less: NEGATIVE  Repeat testing in 3-6 hrs  if clinically indicated. >0.05 ng/mL: POTENTIAL  MYOCARDIAL INJURY. Repeat  testing in 3-6 hrs if  clinically indicated. NOTE: An increase or decrease  of 30% or more on serial  testing suggests a  clinically important change)    05:08   CPK-MB, Serum 1.9 (Result(s) reported on 16 Oct 2013 at 06:16AM.)  Troponin I  0.09 (0.00-0.05 0.05 ng/mL or less: NEGATIVE  Repeat testing in 3-6 hrs  if clinically indicated. >0.05 ng/mL: POTENTIAL  MYOCARDIAL INJURY. Repeat  testing in 3-6 hrs if  clinically indicated. NOTE: An increase or decrease  of 30% or more on serial  testing  suggests a  clinically important change)  Routine UA:  28-Jan-15 04:35   Color (UA) Yellow  Clarity (UA) Clear  Glucose (UA) Negative  Bilirubin (UA) Negative  Ketones (UA) Negative  Specific Gravity (UA) 1.015  Blood (UA) 1+  pH (UA) 6.0  Protein (UA) Negative  Nitrite (UA) Negative  Leukocyte Esterase (UA) Negative (Result(s) reported on 16 Oct 2013 at 04:56AM.)  RBC (UA) 3 /HPF  WBC (UA) <1 /HPF  Bacteria (UA) TRACE  Epithelial Cells (UA) 1 /HPF  Mucous (UA) PRESENT (Result(s) reported on 16 Oct 2013 at 04:56AM.)   Radiology Results: Cardiology:    27-Jan-15 19:53, ED ECG  Ventricular Rate 83  Atrial Rate 83  P-R Interval 186  QRS Duration 84  QT 418  QTc 491  P Axis 68  R Axis 47  T Axis 74  ECG interpretation   Normal sinus rhythm  Prolonged QT  Abnormal ECG  When compared with ECG of 25-Apr-2013 18:03,  Criteria for Septal infarct are no longer Present  ST elevation now present in Anterior leads  ----------unconfirmed----------  Confirmed by OVERREAD, NOT ([id number]), editor PEARSON, BARBARA ([id number]) on 10/16/2013 11:00:16 AM  ED ECG     Percocet: Other, N/V/Diarrhea, Hives  Vital Signs/Nurse's Notes: **Vital Signs.:   28-Jan-15 13:40  Vital Signs Type Routine  Temperature Temperature (F) 97.5  Celsius 36.3  Temperature Source oral  Pulse Pulse 83  Respirations Respirations 18  Systolic BP Systolic BP 1536 Diastolic BP (mmHg) Diastolic BP (mmHg) 81  Mean BP 97  Pulse Ox % Pulse Ox % 95  Pulse Ox Activity Level  At rest  Oxygen Delivery Room Air/ 21 %    Impression 74year old male with near syncope with hypotension, possibly hypovolemic.  Mildly elevated troponins  thought to be demand ischemia from height, hypertension, and not acute coronary syndrome.   Plan 1.  Ambulate and if no other symptoms, consideration for discharge. 2.  Would continue to hold hydrochlorothiazide, wInith early followup with cardiology for consideration of additional blood pressure  medication If indicated. 3.  Further recommendations per Dr. Nehemiah Massed.   Electronic Signatures: Roderic Palau (NP)  (Signed 28-Jan-15 14:05)  Authored: General Aspect/Present Illness, History and Physical Exam, Review of System, Labs, Radiology, Allergies, Vital Signs/Nurse's Notes, Impression/Plan   Last Updated: 28-Jan-15 14:05 by Roderic Palau (NP)

## 2015-02-23 ENCOUNTER — Other Ambulatory Visit: Payer: Self-pay

## 2015-02-23 ENCOUNTER — Encounter: Payer: Self-pay | Admitting: Emergency Medicine

## 2015-02-23 ENCOUNTER — Emergency Department: Payer: Medicare Other

## 2015-02-23 ENCOUNTER — Emergency Department
Admission: EM | Admit: 2015-02-23 | Discharge: 2015-02-23 | Disposition: A | Discharge status: Home / Self Care | Payer: Medicare Other | Attending: Emergency Medicine | Admitting: Emergency Medicine

## 2015-02-23 DIAGNOSIS — S161XXA Strain of muscle, fascia and tendon at neck level, initial encounter: Secondary | ICD-10-CM | POA: Diagnosis not present

## 2015-02-23 DIAGNOSIS — Z720 Tobacco use: Secondary | ICD-10-CM | POA: Diagnosis not present

## 2015-02-23 DIAGNOSIS — E119 Type 2 diabetes mellitus without complications: Secondary | ICD-10-CM | POA: Diagnosis not present

## 2015-02-23 DIAGNOSIS — Y9389 Activity, other specified: Secondary | ICD-10-CM | POA: Diagnosis not present

## 2015-02-23 DIAGNOSIS — Y9289 Other specified places as the place of occurrence of the external cause: Secondary | ICD-10-CM | POA: Diagnosis not present

## 2015-02-23 DIAGNOSIS — I10 Essential (primary) hypertension: Secondary | ICD-10-CM | POA: Insufficient documentation

## 2015-02-23 DIAGNOSIS — R42 Dizziness and giddiness: Secondary | ICD-10-CM | POA: Diagnosis present

## 2015-02-23 DIAGNOSIS — Y998 Other external cause status: Secondary | ICD-10-CM | POA: Insufficient documentation

## 2015-02-23 DIAGNOSIS — X58XXXA Exposure to other specified factors, initial encounter: Secondary | ICD-10-CM | POA: Diagnosis not present

## 2015-02-23 HISTORY — DX: Transient cerebral ischemic attack, unspecified: G45.9

## 2015-02-23 HISTORY — DX: Essential (primary) hypertension: I10

## 2015-02-23 HISTORY — DX: Type 2 diabetes mellitus without complications: E11.9

## 2015-02-23 HISTORY — DX: Atherosclerotic heart disease of native coronary artery without angina pectoris: I25.10

## 2015-02-23 LAB — CBC
HCT: 42.3 % (ref 40.0–52.0)
HEMOGLOBIN: 13.6 g/dL (ref 13.0–18.0)
MCH: 26.4 pg (ref 26.0–34.0)
MCHC: 32.2 g/dL (ref 32.0–36.0)
MCV: 82 fL (ref 80.0–100.0)
Platelets: 139 10*3/uL — ABNORMAL LOW (ref 150–440)
RBC: 5.16 MIL/uL (ref 4.40–5.90)
RDW: 13.8 % (ref 11.5–14.5)
WBC: 9.9 10*3/uL (ref 3.8–10.6)

## 2015-02-23 LAB — BASIC METABOLIC PANEL
Anion gap: 8 (ref 5–15)
BUN: 16 mg/dL (ref 6–20)
CALCIUM: 8.7 mg/dL — AB (ref 8.9–10.3)
CHLORIDE: 98 mmol/L — AB (ref 101–111)
CO2: 27 mmol/L (ref 22–32)
Creatinine, Ser: 1.07 mg/dL (ref 0.61–1.24)
GFR calc Af Amer: >60 mL/min (ref >60)
GFR calc non Af Amer: >60 mL/min (ref >60)
GLUCOSE: 176 mg/dL — AB (ref 65–99)
Potassium: 3.6 mmol/L (ref 3.5–5.1)
Sodium: 133 mmol/L — ABNORMAL LOW (ref 135–145)

## 2015-02-23 LAB — GLUCOSE, CAPILLARY: Glucose-Capillary: 179 mg/dL — ABNORMAL HIGH (ref 65–99)

## 2015-02-23 MED ORDER — HYDROCODONE-ACETAMINOPHEN 5-325 MG PO TABS
ORAL_TABLET | ORAL | Status: AC
  Filled 2015-02-23: qty 1

## 2015-02-23 MED ORDER — HYDROCODONE-ACETAMINOPHEN 5-325 MG PO TABS
ORAL_TABLET | ORAL | Status: AC
  Administered 2015-02-23: 2 via ORAL
  Filled 2015-02-23: qty 1

## 2015-02-23 MED ORDER — HYDROCODONE-ACETAMINOPHEN 5-325 MG PO TABS
2.0000 | ORAL_TABLET | Freq: Once | ORAL | Status: AC
  Administered 2015-02-23: 2 via ORAL

## 2015-02-23 MED ORDER — TRAMADOL HCL 50 MG PO TABS: 50.0000 mg | ORAL_TABLET | Freq: Four times a day (QID) | ORAL | Status: AC | PRN

## 2015-02-23 NOTE — ED Provider Notes (Signed)
[facility] Emergency Department Provider Note     Time seen: ----------------------------------------- 5:58 PM on 02/23/2015 -----------------------------------------    I have reviewed the triage vital signs and the nursing notes.   HISTORY  Chief Complaint Neck Pain and Dizziness    HPI [name] is a 74 y.o. male who presents ER for less side neck pain left-sided head pain, and I will him up from sleep. Describes it as a shooting pain. States been dizzy and off balance of the day.Patient still describing sharp left-sided head and neck pain. Denies any other complaints currently, asking for pain medication to help with the pain as Tylenol is not helping. Patient states moving his neck to the left makes his symptoms worse.    Past Medical History  Diagnosis Date  . Hypertension   . Diabetes mellitus without complication   . Coronary artery disease   . TIA (transient ischemic attack)     There are no active problems to display for this patient.   History reviewed. No pertinent past surgical history.  No current outpatient prescriptions on file.  Allergies Ivp dye and Percocet  No family history on file.  Social History History  Substance Use Topics  . Smoking status: Current Every Day Smoker  . Smokeless tobacco: Not on file  . Alcohol Use: No    Review of Systems Constitutional: Negative for fever. Eyes: Negative for visual changes. ENT: Negative for sore throat. Cardiovascular: Negative for chest pain. Respiratory: Negative for shortness of breath. Gastrointestinal: Negative for abdominal pain, vomiting and diarrhea. Genitourinary: Negative for dysuria. Musculoskeletal: Negative for back pain. Skin: Negative for rash. Neurological: Left-sided head and neck pain  10-point ROS otherwise negative.  ____________________________________________   PHYSICAL EXAM:  VITAL SIGNS: ED Triage Vitals  Enc Vitals Group     BP  02/23/15 1643 190/83 mmHg     Pulse Rate 02/23/15 1643 47     Resp 02/23/15 1643 18     Temp 02/23/15 1643 97.7 F (36.5 C)     Temp Source 02/23/15 1643 Oral     SpO2 02/23/15 1643 95 %     Weight 02/23/15 1643 170 lb (77.111 kg)     Height 02/23/15 1643 5\' 7"  (1.702 m)     Head Cir --      Peak Flow --      Pain Score 02/23/15 1644 8     Pain Loc --      Pain Edu? --      Excl. in [place]? --     Constitutional: Alert and oriented. Well appearing and in no distress. Eyes: Conjunctivae are normal. PERRL. Normal extraocular movements. ENT   Head: Normocephalic and atraumatic.   Nose: No congestion/rhinnorhea.   Mouth/Throat: Mucous membranes are moist.   Neck: No stridor. Hematological/Lymphatic/Immunilogical: No cervical lymphadenopathy. Cardiovascular: Normal rate, regular rhythm. Normal and symmetric distal pulses are present in all extremities. No murmurs, rubs, or gallops. Respiratory: Normal respiratory effort without tachypnea nor retractions. Breath sounds are clear and equal bilaterally. No wheezes/rales/rhonchi. Gastrointestinal: Soft and nontender. No distention. No abdominal bruits. There is no CVA tenderness. Musculoskeletal: Nontender with normal range of motion in all extremities. No joint effusions.  No lower extremity tenderness nor edema. Patient has some scalp and paraspinous tenderness on the left cervical Neurologic:  Normal speech and language. No gross focal neurologic deficits are appreciated. Speech is normal. No gait instability. Skin:  Skin is warm, dry and intact. No rash noted. Psychiatric: Mood and  affect are normal. Speech and behavior are normal. Patient exhibits appropriate insight and judgment.  ____________________________________________    LABS (pertinent positives/negatives)  Labs Reviewed  CBC - Abnormal; Notable for the following:    Platelets 139 (*)    All other components within normal limits  BASIC METABOLIC PANEL -  Abnormal; Notable for the following:    Sodium 133 (*)    Chloride 98 (*)    Glucose, Bld 176 (*)    Calcium 8.7 (*)    All other components within normal limits  GLUCOSE, CAPILLARY - Abnormal; Notable for the following:    Glucose-Capillary 179 (*)    All other components within normal limits  CBG MONITORING, ED    EKG: Interpreted by me. Normal sinus rhythm with frequent PVCs, nonspecific T-wave changes, long QT interval, rate is 92.  ____________________________________________  ED COURSE:  Pertinent labs & imaging results that were available during my care of the patient were reviewed by me and considered in my medical decision making (see chart for details).  We'll check basic labs, CT head and reevaluate. 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  No acute intracranial abnormalities.  ____________________________________________    FINAL ASSESSMENT AND PLAN  Head and neck pain   Plan: Pain seems to be musculoskeletal, worse with movement consistent with either torticollis or some type of cervical strain or sprain. Patient will be given C-spine exercises, as well as pain medication and is encouraged to have close follow-up with his doctor.  Earleen Newport, MD   Earleen Newport, MD 02/23/15 [id number]

## 2015-02-23 NOTE — Discharge Instructions (Signed)
Cervical Strain and Sprain (Whiplash) with Rehab Cervical strain and sprain are injuries that commonly occur with "whiplash" injuries. Whiplash occurs when the neck is forcefully whipped backward or forward, such as during a motor vehicle accident or during contact sports. The muscles, ligaments, tendons, discs, and nerves of the neck are susceptible to injury when this occurs. RISK FACTORS Risk of having a whiplash injury increases if:  Osteoarthritis of the spine.  Situations that make head or neck accidents or trauma more likely.  High-risk sports (football, rugby, wrestling, hockey, auto racing, gymnastics, diving, contact karate, or boxing).  Poor strength and flexibility of the neck.  Previous neck injury.  Poor tackling technique.  Improperly fitted or padded equipment. SYMPTOMS   Pain or stiffness in the front or back of neck or both.  Symptoms may present immediately or up to 24 hours after injury.  Dizziness, headache, nausea, and vomiting.  Muscle spasm with soreness and stiffness in the neck.  Tenderness and swelling at the injury site. PREVENTION  Learn and use proper technique (avoid tackling with the head, spearing, and head-butting; use proper falling techniques to avoid landing on the head).  Warm up and stretch properly before activity.  Maintain physical fitness:  Strength, flexibility, and endurance.  Cardiovascular fitness.  Wear properly fitted and padded protective equipment, such as padded soft collars, for participation in contact sports. PROGNOSIS  Recovery from cervical strain and sprain injuries is dependent on the extent of the injury. These injuries are usually curable in 1 week to 3 months with appropriate treatment.  RELATED COMPLICATIONS   Temporary numbness and weakness may occur if the nerve roots are damaged, and this may persist until the nerve has completely healed.  Chronic pain due to frequent recurrence of  symptoms.  Prolonged healing, especially if activity is resumed too soon (before complete recovery). TREATMENT  Treatment initially involves the use of ice and medication to help reduce pain and inflammation. It is also important to perform strengthening and stretching exercises and modify activities that worsen symptoms so the injury does not get worse. These exercises may be performed at home or with a therapist. For patients who experience severe symptoms, a soft, padded collar may be recommended to be worn around the neck.  Improving your posture may help reduce symptoms. Posture improvement includes pulling your chin and abdomen in while sitting or standing. If you are sitting, sit in a firm chair with your buttocks against the back of the chair. While sleeping, try replacing your pillow with a small towel rolled to 2 inches in diameter, or use a cervical pillow or soft cervical collar. Poor sleeping positions delay healing.  For patients with nerve root damage, which causes numbness or weakness, the use of a cervical traction apparatus may be recommended. Surgery is rarely necessary for these injuries. However, cervical strain and sprains that are present at birth (congenital) may require surgery. MEDICATION   If pain medication is necessary, nonsteroidal anti-inflammatory medications, such as aspirin and ibuprofen, or other minor pain relievers, such as acetaminophen, are often recommended.  Do not take pain medication for 7 days before surgery.  Prescription pain relievers may be given if deemed necessary by your caregiver. Use only as directed and only as much as you need. HEAT AND COLD:   Cold treatment (icing) relieves pain and reduces inflammation. Cold treatment should be applied for 10 to 15 minutes every 2 to 3 hours for inflammation and pain and immediately after any activity that aggravates   your symptoms. Use ice packs or an ice massage.  Heat treatment may be used prior to  performing the stretching and strengthening activities prescribed by your caregiver, physical therapist, or athletic trainer. Use a heat pack or a warm soak. SEEK MEDICAL CARE IF:   Symptoms get worse or do not improve in 2 weeks despite treatment.  New, unexplained symptoms develop (drugs used in treatment may produce side effects). EXERCISES RANGE OF MOTION (ROM) AND STRETCHING EXERCISES - Cervical Strain and Sprain These exercises may help you when beginning to rehabilitate your injury. In order to successfully resolve your symptoms, you must improve your posture. These exercises are designed to help reduce the forward-head and rounded-shoulder posture which contributes to this condition. Your symptoms may resolve with or without further involvement from your physician, physical therapist or athletic trainer. While completing these exercises, remember:   Restoring tissue flexibility helps normal motion to return to the joints. This allows healthier, less painful movement and activity.  An effective stretch should be held for at least 20 seconds, although you may need to begin with shorter hold times for comfort.  A stretch should never be painful. You should only feel a gentle lengthening or release in the stretched tissue. STRETCH- Axial Extensors  Lie on your back on the floor. You may bend your knees for comfort. Place a rolled-up hand towel or dish towel, about 2 inches in diameter, under the part of your head that makes contact with the floor.  Gently tuck your chin, as if trying to make a "double chin," until you feel a gentle stretch at the base of your head.  Hold __________ seconds. Repeat __________ times. Complete this exercise __________ times per day.  STRETCH - Axial Extension   Stand or sit on a firm surface. Assume a good posture: chest up, shoulders drawn back, abdominal muscles slightly tense, knees unlocked (if standing) and feet hip width apart.  Slowly retract your  chin so your head slides back and your chin slightly lowers. Continue to look straight ahead.  You should feel a gentle stretch in the back of your head. Be certain not to feel an aggressive stretch since this can cause headaches later.  Hold for __________ seconds. Repeat __________ times. Complete this exercise __________ times per day. STRETCH - Cervical Side Bend   Stand or sit on a firm surface. Assume a good posture: chest up, shoulders drawn back, abdominal muscles slightly tense, knees unlocked (if standing) and feet hip width apart.  Without letting your nose or shoulders move, slowly tip your right / left ear to your shoulder until your feel a gentle stretch in the muscles on the opposite side of your neck.  Hold __________ seconds. Repeat __________ times. Complete this exercise __________ times per day. STRETCH - Cervical Rotators   Stand or sit on a firm surface. Assume a good posture: chest up, shoulders drawn back, abdominal muscles slightly tense, knees unlocked (if standing) and feet hip width apart.  Keeping your eyes level with the ground, slowly turn your head until you feel a gentle stretch along the back and opposite side of your neck.  Hold __________ seconds. Repeat __________ times. Complete this exercise __________ times per day. RANGE OF MOTION - Neck Circles   Stand or sit on a firm surface. Assume a good posture: chest up, shoulders drawn back, abdominal muscles slightly tense, knees unlocked (if standing) and feet hip width apart.  Gently roll your head down and around from the   back of one shoulder to the back of the other. The motion should never be forced or painful.  Repeat the motion 10-20 times, or until you feel the neck muscles relax and loosen. Repeat __________ times. Complete the exercise __________ times per day. STRENGTHENING EXERCISES - Cervical Strain and Sprain These exercises may help you when beginning to rehabilitate your injury. They may  resolve your symptoms with or without further involvement from your physician, physical therapist, or athletic trainer. While completing these exercises, remember:   Muscles can gain both the endurance and the strength needed for everyday activities through controlled exercises.  Complete these exercises as instructed by your physician, physical therapist, or athletic trainer. Progress the resistance and repetitions only as guided.  You may experience muscle soreness or fatigue, but the pain or discomfort you are trying to eliminate should never worsen during these exercises. If this pain does worsen, stop and make certain you are following the directions exactly. If the pain is still present after adjustments, discontinue the exercise until you can discuss the trouble with your clinician. STRENGTH - Cervical Flexors, Isometric  Face a wall, standing about 6 inches away. Place a small pillow, a ball about 6-8 inches in diameter, or a folded towel between your forehead and the wall.  Slightly tuck your chin and gently push your forehead into the soft object. Push only with mild to moderate intensity, building up tension gradually. Keep your jaw and forehead relaxed.  Hold 10 to 20 seconds. Keep your breathing relaxed.  Release the tension slowly. Relax your neck muscles completely before you start the next repetition. Repeat __________ times. Complete this exercise __________ times per day. STRENGTH- Cervical Lateral Flexors, Isometric   Stand about 6 inches away from a wall. Place a small pillow, a ball about 6-8 inches in diameter, or a folded towel between the side of your head and the wall.  Slightly tuck your chin and gently tilt your head into the soft object. Push only with mild to moderate intensity, building up tension gradually. Keep your jaw and forehead relaxed.  Hold 10 to 20 seconds. Keep your breathing relaxed.  Release the tension slowly. Relax your neck muscles completely  before you start the next repetition. Repeat __________ times. Complete this exercise __________ times per day. STRENGTH - Cervical Extensors, Isometric   Stand about 6 inches away from a wall. Place a small pillow, a ball about 6-8 inches in diameter, or a folded towel between the back of your head and the wall.  Slightly tuck your chin and gently tilt your head back into the soft object. Push only with mild to moderate intensity, building up tension gradually. Keep your jaw and forehead relaxed.  Hold 10 to 20 seconds. Keep your breathing relaxed.  Release the tension slowly. Relax your neck muscles completely before you start the next repetition. Repeat __________ times. Complete this exercise __________ times per day. POSTURE AND BODY MECHANICS CONSIDERATIONS - Cervical Strain and Sprain Keeping correct posture when sitting, standing or completing your activities will reduce the stress put on different body tissues, allowing injured tissues a chance to heal and limiting painful experiences. The following are general guidelines for improved posture. Your physician or physical therapist will provide you with any instructions specific to your needs. While reading these guidelines, remember:  The exercises prescribed by your provider will help you have the flexibility and strength to maintain correct postures.  The correct posture provides the optimal environment for your joints to   work. All of your joints have less wear and tear when properly supported by a spine with good posture. This means you will experience a healthier, less painful body.  Correct posture must be practiced with all of your activities, especially prolonged sitting and standing. Correct posture is as important when doing repetitive low-stress activities (typing) as it is when doing a single heavy-load activity (lifting). PROLONGED STANDING WHILE SLIGHTLY LEANING FORWARD When completing a task that requires you to lean  forward while standing in one place for a long time, place either foot up on a stationary 2- to 4-inch high object to help maintain the best posture. When both feet are on the ground, the low back tends to lose its slight inward curve. If this curve flattens (or becomes too large), then the back and your other joints will experience too much stress, fatigue more quickly, and can cause pain.  RESTING POSITIONS Consider which positions are most painful for you when choosing a resting position. If you have pain with flexion-based activities (sitting, bending, stooping, squatting), choose a position that allows you to rest in a less flexed posture. You would want to avoid curling into a fetal position on your side. If your pain worsens with extension-based activities (prolonged standing, working overhead), avoid resting in an extended position such as sleeping on your stomach. Most people will find more comfort when they rest with their spine in a more neutral position, neither too rounded nor too arched. Lying on a non-sagging bed on your side with a pillow between your knees, or on your back with a pillow under your knees will often provide some relief. Keep in mind, being in any one position for a prolonged period of time, no matter how correct your posture, can still lead to stiffness. WALKING Walk with an upright posture. Your ears, shoulders, and hips should all line up. OFFICE WORK When working at a desk, create an environment that supports good, upright posture. Without extra support, muscles fatigue and lead to excessive strain on joints and other tissues. CHAIR:  A chair should be able to slide under your desk when your back makes contact with the back of the chair. This allows you to work closely.  The chair's height should allow your eyes to be level with the upper part of your monitor and your hands to be slightly lower than your elbows.  Body position:  Your feet should make contact with the  floor. If this is not possible, use a foot rest.  Keep your ears over your shoulders. This will reduce stress on your neck and low back. Document Released: 09/05/2005 Document Revised: 01/20/2014 Document Reviewed: 12/18/2008 [employer] Patient Information 2015 [employer]. This information is not intended to replace advice given to you by your health care provider. Make sure you discuss any questions you have with your health care provider.  

## 2015-02-23 NOTE — ED Notes (Signed)
Pt states around 0600 this am he started having left sided neck pain and and "shooting" pain up the left side of his head they woke him up from sleep, states he has been dizzy and off balance throughout the day

## 2015-11-26 ENCOUNTER — Emergency Department: Payer: Medicare Other

## 2015-11-26 ENCOUNTER — Emergency Department
Admission: EM | Admit: 2015-11-26 | Discharge: 2015-11-26 | Disposition: A | Discharge status: Home / Self Care | Payer: Medicare Other | Attending: Emergency Medicine | Admitting: Emergency Medicine

## 2015-11-26 ENCOUNTER — Encounter: Payer: Self-pay | Admitting: Emergency Medicine

## 2015-11-26 DIAGNOSIS — F17200 Nicotine dependence, unspecified, uncomplicated: Secondary | ICD-10-CM | POA: Insufficient documentation

## 2015-11-26 DIAGNOSIS — E119 Type 2 diabetes mellitus without complications: Secondary | ICD-10-CM | POA: Insufficient documentation

## 2015-11-26 DIAGNOSIS — J069 Acute upper respiratory infection, unspecified: Secondary | ICD-10-CM | POA: Diagnosis not present

## 2015-11-26 DIAGNOSIS — J441 Chronic obstructive pulmonary disease with (acute) exacerbation: Secondary | ICD-10-CM | POA: Diagnosis not present

## 2015-11-26 DIAGNOSIS — R0602 Shortness of breath: Secondary | ICD-10-CM | POA: Diagnosis present

## 2015-11-26 DIAGNOSIS — I10 Essential (primary) hypertension: Secondary | ICD-10-CM | POA: Insufficient documentation

## 2015-11-26 HISTORY — DX: Chronic obstructive pulmonary disease, unspecified: J44.9

## 2015-11-26 LAB — CBC WITH DIFFERENTIAL/PLATELET
BASOS ABS: 0 10*3/uL (ref 0–0.1)
BASOS PCT: 1 %
EOS PCT: 1 %
Eosinophils Absolute: 0.1 10*3/uL (ref 0–0.7)
HCT: 39.6 % — ABNORMAL LOW (ref 40.0–52.0)
Hemoglobin: 13 g/dL (ref 13.0–18.0)
LYMPHS PCT: 14 %
Lymphs Abs: 0.8 10*3/uL — ABNORMAL LOW (ref 1.0–3.6)
MCH: 26.8 pg (ref 26.0–34.0)
MCHC: 32.8 g/dL (ref 32.0–36.0)
MCV: 81.8 fL (ref 80.0–100.0)
MONO ABS: 0.8 10*3/uL (ref 0.2–1.0)
Monocytes Relative: 14 %
Neutro Abs: 4.2 10*3/uL (ref 1.4–6.5)
Neutrophils Relative %: 70 %
PLATELETS: 108 10*3/uL — AB (ref 150–440)
RBC: 4.85 MIL/uL (ref 4.40–5.90)
RDW: 14 % (ref 11.5–14.5)
WBC: 6 10*3/uL (ref 3.8–10.6)

## 2015-11-26 LAB — RAPID INFLUENZA A&B ANTIGENS (ARMC ONLY): INFLUENZA A (ARMC): NEGATIVE

## 2015-11-26 LAB — RAPID INFLUENZA A&B ANTIGENS: Influenza B (ARMC): NEGATIVE

## 2015-11-26 LAB — URINALYSIS COMPLETE WITH MICROSCOPIC (ARMC ONLY)
Bacteria, UA: NONE SEEN
Bilirubin Urine: NEGATIVE
GLUCOSE, UA: NEGATIVE mg/dL
KETONES UR: NEGATIVE mg/dL
Leukocytes, UA: NEGATIVE
NITRITE: NEGATIVE
Protein, ur: NEGATIVE mg/dL
SPECIFIC GRAVITY, URINE: 1.015 (ref 1.005–1.030)
Squamous Epithelial / LPF: NONE SEEN
pH: 8 (ref 5.0–8.0)

## 2015-11-26 LAB — COMPREHENSIVE METABOLIC PANEL
ALT: 13 U/L — ABNORMAL LOW (ref 17–63)
ANION GAP: 5 (ref 5–15)
AST: 21 U/L (ref 15–41)
Albumin: 4 g/dL (ref 3.5–5.0)
Alkaline Phosphatase: 58 U/L (ref 38–126)
BILIRUBIN TOTAL: 0.1 mg/dL — AB (ref 0.3–1.2)
BUN: 16 mg/dL (ref 6–20)
CALCIUM: 8.3 mg/dL — AB (ref 8.9–10.3)
CO2: 29 mmol/L (ref 22–32)
Chloride: 98 mmol/L — ABNORMAL LOW (ref 101–111)
Creatinine, Ser: 1.08 mg/dL (ref 0.61–1.24)
GFR calc Af Amer: >60 mL/min (ref >60)
GLUCOSE: 117 mg/dL — AB (ref 65–99)
POTASSIUM: 3.4 mmol/L — AB (ref 3.5–5.1)
Sodium: 132 mmol/L — ABNORMAL LOW (ref 135–145)
TOTAL PROTEIN: 7.1 g/dL (ref 6.5–8.1)

## 2015-11-26 LAB — LACTIC ACID, PLASMA: LACTIC ACID, VENOUS: 0.7 mmol/L (ref 0.5–2.0)

## 2015-11-26 MED ORDER — SODIUM CHLORIDE 0.9 % IV BOLUS (SEPSIS)
1000.0000 mL | INTRAVENOUS | Status: AC
  Administered 2015-11-26: 1000 mL via INTRAVENOUS

## 2015-11-26 MED ORDER — AZITHROMYCIN 500 MG IV SOLR
500.0000 mg | Freq: Once | INTRAVENOUS | Status: AC
  Administered 2015-11-26: 500 mg via INTRAVENOUS
  Filled 2015-11-26 (×2): qty 500

## 2015-11-26 MED ORDER — AZITHROMYCIN 250 MG PO TABS: ORAL_TABLET | ORAL | Status: DC

## 2015-11-26 MED ORDER — DEXTROSE 5 % IV SOLN
1.0000 g | Freq: Once | INTRAVENOUS | Status: AC
  Administered 2015-11-26: 1 g via INTRAVENOUS
  Filled 2015-11-26: qty 10

## 2015-11-26 MED ORDER — SODIUM CHLORIDE 0.9 % IV BOLUS (SEPSIS)
500.0000 mL | INTRAVENOUS | Status: AC
  Administered 2015-11-26: 500 mL via INTRAVENOUS

## 2015-11-26 MED ORDER — ACETAMINOPHEN 500 MG PO TABS
1000.0000 mg | ORAL_TABLET | Freq: Once | ORAL | Status: AC
  Administered 2015-11-26: 1000 mg via ORAL
  Filled 2015-11-26: qty 2

## 2015-11-26 NOTE — Discharge Instructions (Signed)
You were evaluated for fever and cough and found to have clinical upper respiratory infection. Your exam and evaluation are reassuring in the emergency department today. You're being treated with antibiotic azithromycin.  Return to the emergency department for any worsening condition including trouble breathing, chest pain, dizziness, passing out, or any other symptoms concerning to you.  Follow-up with a physician in 2 days.   Upper Respiratory Infection, Adult Most upper respiratory infections (URIs) are a viral infection of the air passages leading to the lungs. A URI affects the nose, throat, and upper air passages. The most common type of URI is nasopharyngitis and is typically referred to as "the common cold." URIs run their course and usually go away on their own. Most of the time, a URI does not require medical attention, but sometimes a bacterial infection in the upper airways can follow a viral infection. This is called a secondary infection. Sinus and middle ear infections are common types of secondary upper respiratory infections. Bacterial pneumonia can also complicate a URI. A URI can worsen asthma and chronic obstructive pulmonary disease (COPD). Sometimes, these complications can require emergency medical care and may be life threatening.  CAUSES Almost all URIs are caused by viruses. A virus is a type of germ and can spread from one person to another.  RISKS FACTORS You may be at risk for a URI if:   You smoke.   You have chronic heart or lung disease.  You have a weakened defense (immune) system.   You are very young or very old.   You have nasal allergies or asthma.  You work in crowded or poorly ventilated areas.  You work in health care facilities or schools. SIGNS AND SYMPTOMS  Symptoms typically develop 2-3 days after you come in contact with a cold virus. Most viral URIs last 7-10 days. However, viral URIs from the influenza virus (flu virus) can last 14-18  days and are typically more severe. Symptoms may include:   Runny or stuffy (congested) nose.   Sneezing.   Cough.   Sore throat.   Headache.   Fatigue.   Fever.   Loss of appetite.   Pain in your forehead, behind your eyes, and over your cheekbones (sinus pain).  Muscle aches.  DIAGNOSIS  Your health care provider may diagnose a URI by:  Physical exam.  Tests to check that your symptoms are not due to another condition such as:  Strep throat.  Sinusitis.  Pneumonia.  Asthma. TREATMENT  A URI goes away on its own with time. It cannot be cured with medicines, but medicines may be prescribed or recommended to relieve symptoms. Medicines may help:  Reduce your fever.  Reduce your cough.  Relieve nasal congestion. HOME CARE INSTRUCTIONS   Take medicines only as directed by your health care provider.   Gargle warm saltwater or take cough drops to comfort your throat as directed by your health care provider.  Use a warm mist humidifier or inhale steam from a shower to increase air moisture. This may make it easier to breathe.  Drink enough fluid to keep your urine clear or pale yellow.   Eat soups and other clear broths and maintain good nutrition.   Rest as needed.   Return to work when your temperature has returned to normal or as your health care provider advises. You may need to stay home longer to avoid infecting others. You can also use a face mask and careful hand washing to prevent spread  of the virus.  Increase the usage of your inhaler if you have asthma.   Do not use any tobacco products, including cigarettes, chewing tobacco, or electronic cigarettes. If you need help quitting, ask your health care provider. PREVENTION  The best way to protect yourself from getting a cold is to practice good hygiene.   Avoid oral or hand contact with people with cold symptoms.   Wash your hands often if contact occurs.  There is no clear  evidence that vitamin C, vitamin E, echinacea, or exercise reduces the chance of developing a cold. However, it is always recommended to get plenty of rest, exercise, and practice good nutrition.  SEEK MEDICAL CARE IF:   You are getting worse rather than better.   Your symptoms are not controlled by medicine.   You have chills.  You have worsening shortness of breath.  You have brown or red mucus.  You have yellow or brown nasal discharge.  You have pain in your face, especially when you bend forward.  You have a fever.  You have swollen neck glands.  You have pain while swallowing.  You have white areas in the back of your throat. SEEK IMMEDIATE MEDICAL CARE IF:   You have severe or persistent:  Headache.  Ear pain.  Sinus pain.  Chest pain.  You have chronic lung disease and any of the following:  Wheezing.  Prolonged cough.  Coughing up blood.  A change in your usual mucus.  You have a stiff neck.  You have changes in your:  Vision.  Hearing.  Thinking.  Mood. MAKE SURE YOU:   Understand these instructions.  Will watch your condition.  Will get help right away if you are not doing well or get worse.   This information is not intended to replace advice given to you by your health care provider. Make sure you discuss any questions you have with your health care provider.   Document Released: 03/01/2001 Document Revised: 01/20/2015 Document Reviewed: 12/11/2013 Elsevier Interactive Patient Education [employer].

## 2015-11-26 NOTE — ED Notes (Signed)
Pt sleeping, easily aroused. Call bell in reach. Breathing equal and unlabored.

## 2015-11-26 NOTE — ED Notes (Addendum)
Pt presents to the ER from home with complaints of chest pain, fever, cough generalized weakness since yesterday. History of COPD. EMS administered one albuterol treatment pt able to cough up some sputum. Pt talks in complete sentences no respiratory distress noted. [facility] VS: 100.74F, 99 pulse, 149/98 BP, 20 respirations.

## 2015-11-26 NOTE — ED Notes (Signed)
X-ray at bedside

## 2015-11-26 NOTE — ED Provider Notes (Signed)
[facility] Emergency Department Provider Note  Time seen: 6:45 AM  I have reviewed the triage vital signs and the nursing notes.   HISTORY  Chief Complaint Shortness of Breath; Cough; and Chest Pain    HPI [name] is a 75 y.o. male with a past medical history of COPD, hypertension, diabetes, TIA, presents to the emergency department with cough and fever. According to the patient for the past 3 days he has been coughing with occasional sputum production. Since yesterday he has been having fevers, 100.4 at home. States generalized weakness. Upon arrival to the emergency department, patient is able to speak normally, slightly tachypnea, no acute distress. Patient does have a frequent cough with occasional sputum production in the emergency department. Patient has a history of COPD but does not wear oxygen. States mild shortness of breath. Denies chest pain. Denies abdominal pain.   Past Medical History  Diagnosis Date  . Hypertension   . Diabetes mellitus without complication ([place])   . Coronary artery disease   . TIA (transient ischemic attack)   . COPD (chronic obstructive pulmonary disease) (HCC)     There are no active problems to display for this patient.   History reviewed. No pertinent past surgical history.  Current Outpatient Rx  Name  Route  Sig  Dispense  Refill  . traMADol (ULTRAM) 50 MG tablet   Oral   Take 1 tablet (50 mg total) by mouth every 6 (six) hours as needed.   20 tablet   0     Allergies Ivp dye and Percocet  No family history on file.  Social History Social History  Substance Use Topics  . Smoking status: Current Every Day Smoker  . Smokeless tobacco: None  . Alcohol Use: No    Review of Systems Constitutional: Positive for fever. Cardiovascular: Negative for chest pain. Respiratory: Mild shortness of breath. Positive for cough with sputum production Gastrointestinal: Negative for abdominal  pain Musculoskeletal: Negative for back pain. Neurological: Mild headache 10-point ROS otherwise negative.  ____________________________________________   PHYSICAL EXAM:  VITAL SIGNS: ED Triage Vitals  Enc Vitals Group     BP 11/26/15 0638 168/90 mmHg     Pulse Rate 11/26/15 0638 105     Resp 11/26/15 0638 22     Temp 11/26/15 0638 98.7 F (37.1 C)     Temp Source 11/26/15 0638 Oral     SpO2 11/26/15 0638 96 %     Weight 11/26/15 0638 164 lb (74.39 kg)     Height 11/26/15 0638 5\' 7"  (1.702 m)     Head Cir --      Peak Flow --      Pain Score 11/26/15 0638 8     Pain Loc --      Pain Edu? --      Excl. in [place]? --     Constitutional: Alert and oriented. Well appearing and in no distress. Eyes: Normal exam ENT   Head: Normocephalic and atraumatic.   Mouth/Throat: Mucous membranes are moist. Cardiovascular: Regular rhythm, rate around 110 bpm. No murmur. Respiratory: Mild tachypnea. No retractions. Breath sounds are clear. Frequent cough during examination Gastrointestinal: Soft and nontender. No distention.  Musculoskeletal: Nontender with normal range of motion in all extremities. No lower extremity tenderness or edema. Neurologic:  Normal speech and language. No gross focal neurologic deficits Skin:  Skin is warm, dry and intact.  Psychiatric: Mood and affect are normal. Speech and behavior are normal.   ____________________________________________  EKG  EKG reviewed and interpreted by myself shows sinus tachycardia 105 bpm, narrow QRS, normal axis, normal intervals, inferolateral ST depressions. No lateral ST changes appear more pronounced than prior EKGs 03-09-2016. Inferior ST changes appear new compared 03/09/16.  ____________________________________________    RADIOLOGY  Chest x-ray shows no acute abnormality  ____________________________________________     INITIAL IMPRESSION / ASSESSMENT AND PLAN / ED COURSE  Pertinent labs & imaging results that  were available during my care of the patient were reviewed by me and considered in my medical decision making (see chart for details).  Patient presents to the emergency department with 3 days of cough, now with generalized weakness and fever. Per EMS vitals patient has a temperature 100.3, heart rate of 99. In the emergency department patient has a heart rate of 105, respiratory rate of 22, frequent cough in the emergency department. I ordered code sepsis protocols for the patient. Suspect likely pneumonia we will start broad-spectrum antibiotics, check labs, cultures, chest x-ray and closely monitor in the emergency department.  Patient's EKG appears to show increased ST changes in the inferolateral leads, could be a result of demand ischemia, labs pending.  No acute changes on chest x-ray. CBC shows normal results, other labs pending. Overall the patient appears quite well at this time. Patient care signed out to Dr. Reita Cliche.  ____________________________________________   FINAL CLINICAL IMPRESSION(S) / ED DIAGNOSES  Upper respiratory infection   Harvest Norberto, MD 11/26/15 [phone]

## 2015-11-26 NOTE — ED Provider Notes (Signed)
[facility]  I accepted care from Dr. Kerman Passey ____________________________________________    LABS (pertinent positives/negatives)  Urinalysis 6-30 red blood cells and otherwise negative Lactate 0.7 Rapid flu negative Comprehensive metabolic panel significant for sodium 132, potassium 3.4, chloride 98 and otherwise without significant abnormality White blood count 6.0 with no left shift. Hemoglobin 13.0 and platelet count 108   ____________________________________________    RADIOLOGY All xrays were viewed by me. Imaging interpreted by radiologist.  Chest x-ray portable: Emphysematous changes in the lungs. No evidence of active pulmonary disease.  ____________________________________________   PROCEDURES  Procedure(s) performed: None  Critical Care performed: None  ____________________________________________   INITIAL IMPRESSION / ASSESSMENT AND PLAN / ED COURSE   Pertinent labs & imaging results that were available during my care of the patient were reviewed by me and considered in my medical decision making (see chart for details).  Patient was started on sepsis protocol, and during his evaluation, he has remained well appearing overall. His heart rate did come back down into the 90s. His laboratory studies are all reassuring. Patient clinically is reassuring. He did receive Rocephin and azithromycin by IV, and will be discharged home with 4 more days of azithromycin.  CONSULTATIONS: None    Patient / Family / Caregiver informed of clinical course, medical decision-making process, and agree with plan.   I discussed return precautions, follow-up instructions, and discharged instructions with patient and/or family.     ____________________________________________   FINAL CLINICAL IMPRESSION(S) / ED DIAGNOSES  Final diagnoses:  Upper respiratory infection        Lisa Roca, MD 11/26/15 [phone]

## 2015-11-26 NOTE — ED Notes (Signed)
Given report to [place], Therapist, sports

## 2015-11-26 NOTE — ED Notes (Signed)
Pt using the phone to call a ride to pick him up.

## 2015-11-26 NOTE — ED Notes (Signed)
Pt resting quielty. Call bell in reach. No complaints at this time. Will continue to assess.

## 2015-11-28 ENCOUNTER — Emergency Department: Payer: Medicare Other

## 2015-11-28 ENCOUNTER — Emergency Department
Admission: EM | Admit: 2015-11-28 | Discharge: 2015-11-28 | Disposition: A | Discharge status: Home / Self Care | Payer: Medicare Other | Attending: Emergency Medicine | Admitting: Emergency Medicine

## 2015-11-28 ENCOUNTER — Encounter: Payer: Self-pay | Admitting: Emergency Medicine

## 2015-11-28 DIAGNOSIS — J449 Chronic obstructive pulmonary disease, unspecified: Secondary | ICD-10-CM | POA: Insufficient documentation

## 2015-11-28 DIAGNOSIS — F17210 Nicotine dependence, cigarettes, uncomplicated: Secondary | ICD-10-CM | POA: Diagnosis not present

## 2015-11-28 DIAGNOSIS — I2510 Atherosclerotic heart disease of native coronary artery without angina pectoris: Secondary | ICD-10-CM | POA: Insufficient documentation

## 2015-11-28 DIAGNOSIS — R05 Cough: Secondary | ICD-10-CM | POA: Diagnosis present

## 2015-11-28 DIAGNOSIS — J209 Acute bronchitis, unspecified: Secondary | ICD-10-CM | POA: Insufficient documentation

## 2015-11-28 DIAGNOSIS — R509 Fever, unspecified: Secondary | ICD-10-CM | POA: Insufficient documentation

## 2015-11-28 DIAGNOSIS — Z8673 Personal history of transient ischemic attack (TIA), and cerebral infarction without residual deficits: Secondary | ICD-10-CM | POA: Insufficient documentation

## 2015-11-28 DIAGNOSIS — I10 Essential (primary) hypertension: Secondary | ICD-10-CM | POA: Insufficient documentation

## 2015-11-28 DIAGNOSIS — Z7984 Long term (current) use of oral hypoglycemic drugs: Secondary | ICD-10-CM | POA: Insufficient documentation

## 2015-11-28 DIAGNOSIS — Z79899 Other long term (current) drug therapy: Secondary | ICD-10-CM | POA: Insufficient documentation

## 2015-11-28 DIAGNOSIS — R0981 Nasal congestion: Secondary | ICD-10-CM | POA: Insufficient documentation

## 2015-11-28 DIAGNOSIS — E119 Type 2 diabetes mellitus without complications: Secondary | ICD-10-CM | POA: Diagnosis not present

## 2015-11-28 LAB — RAPID INFLUENZA A&B ANTIGENS
Influenza A (ARMC): NEGATIVE
Influenza B (ARMC): NEGATIVE

## 2015-11-28 LAB — CBC WITH DIFFERENTIAL/PLATELET
Basophils Absolute: 0 10*3/uL (ref 0–0.1)
EOS ABS: 0 10*3/uL (ref 0–0.7)
HCT: 37.1 % — ABNORMAL LOW (ref 40.0–52.0)
HEMOGLOBIN: 12.1 g/dL — AB (ref 13.0–18.0)
Lymphocytes Relative: 34 %
Lymphs Abs: 1.1 10*3/uL (ref 1.0–3.6)
MCH: 26.7 pg (ref 26.0–34.0)
MCHC: 32.6 g/dL (ref 32.0–36.0)
MCV: 81.9 fL (ref 80.0–100.0)
Monocytes Absolute: 0.4 10*3/uL (ref 0.2–1.0)
Neutro Abs: 1.8 10*3/uL (ref 1.4–6.5)
PLATELETS: 95 10*3/uL — AB (ref 150–440)
RBC: 4.53 MIL/uL (ref 4.40–5.90)
RDW: 14.3 % (ref 11.5–14.5)
WBC: 3.3 10*3/uL — AB (ref 3.8–10.6)

## 2015-11-28 LAB — COMPREHENSIVE METABOLIC PANEL
ALK PHOS: 49 U/L (ref 38–126)
ALT: 20 U/L (ref 17–63)
AST: 34 U/L (ref 15–41)
Albumin: 3.4 g/dL — ABNORMAL LOW (ref 3.5–5.0)
Anion gap: 4 — ABNORMAL LOW (ref 5–15)
BUN: 16 mg/dL (ref 6–20)
CALCIUM: 7.7 mg/dL — AB (ref 8.9–10.3)
CO2: 29 mmol/L (ref 22–32)
CREATININE: 1.18 mg/dL (ref 0.61–1.24)
Chloride: 99 mmol/L — ABNORMAL LOW (ref 101–111)
GFR, EST NON AFRICAN AMERICAN: 59 mL/min — AB (ref >60)
Glucose, Bld: 103 mg/dL — ABNORMAL HIGH (ref 65–99)
Potassium: 3.3 mmol/L — ABNORMAL LOW (ref 3.5–5.1)
Sodium: 132 mmol/L — ABNORMAL LOW (ref 135–145)
Total Bilirubin: 0.5 mg/dL (ref 0.3–1.2)
Total Protein: 6 g/dL — ABNORMAL LOW (ref 6.5–8.1)

## 2015-11-28 LAB — URINE CULTURE: CULTURE: NO GROWTH

## 2015-11-28 MED ORDER — METHYLPREDNISOLONE SODIUM SUCC 125 MG IJ SOLR
125.0000 mg | Freq: Once | INTRAMUSCULAR | Status: AC
  Administered 2015-11-28: 125 mg via INTRAVENOUS
  Filled 2015-11-28: qty 2

## 2015-11-28 MED ORDER — PREDNISONE 10 MG (21) PO TBPK: 10.0000 mg | ORAL_TABLET | Freq: Every day | ORAL | Status: DC

## 2015-11-28 MED ORDER — HYDROCOD POLST-CPM POLST ER 10-8 MG/5ML PO SUER: 5.0000 mL | Freq: Two times a day (BID) | ORAL | Status: DC

## 2015-11-28 MED ORDER — SODIUM CHLORIDE 0.9 % IV BOLUS (SEPSIS)
500.0000 mL | Freq: Once | INTRAVENOUS | Status: AC
  Administered 2015-11-28: 500 mL via INTRAVENOUS

## 2015-11-28 MED ORDER — IPRATROPIUM-ALBUTEROL 0.5-2.5 (3) MG/3ML IN SOLN
3.0000 mL | Freq: Once | RESPIRATORY_TRACT | Status: AC
  Administered 2015-11-28: 3 mL via RESPIRATORY_TRACT
  Filled 2015-11-28: qty 3

## 2015-11-28 NOTE — Discharge Instructions (Signed)

## 2015-11-28 NOTE — ED Notes (Signed)
Discussed discharge instructions, prescriptions, and follow-up care with patient and family members, with pt's permission. No questions or concerns at this time. Pt stable at discharge.

## 2015-11-28 NOTE — ED Notes (Signed)
Pt to ed with c/o cough, congestion, fever x 4 days.  Pt states he was seen here on Thursday for same, started on abx but states he does not feel better.

## 2015-11-28 NOTE — ED Notes (Signed)
Pt able to ambulate in room off oxygen with O2 sat 94-96%. No c/o shortness of breath or pain.

## 2015-11-28 NOTE — ED Provider Notes (Signed)
Time Seen: Approximately 1415 I have reviewed the triage notes  Chief Complaint: Cough; Nasal Congestion; and Fever   History of Present Illness: [name] is a 75 y.o. male *who presents with still a persistent cough, nasal congestion, and describes low-grade fevers at home over the last 4 days. Patient was evaluated here on March 9 and was prescribed outpatient antibiotics. Initial flu testing was negative and the patient was discharged on a Zithromax. He was brought back by his family due to generalized weakness and a frontal headache. He did have some mildly low pulse oximetry in the triage area and was placed on O2 nasal cannula. His cough is still remained dry and nonproductive. He states he has some right lower chest discomfort versus right upper quadrant pain with deep inspiration. He denies any focal weakness. He denies any thick yellow to green tinged nasal drainage. Past Medical History  Diagnosis Date  . Hypertension   . Diabetes mellitus without complication ([place])   . Coronary artery disease   . TIA (transient ischemic attack)   . COPD (chronic obstructive pulmonary disease) (HCC)     There are no active problems to display for this patient.   History reviewed. No pertinent past surgical history.  History reviewed. No pertinent past surgical history.  Current Outpatient Rx  Name  Route  Sig  Dispense  Refill  . amLODipine (NORVASC) 5 MG tablet   Oral   Take 5 mg by mouth daily.         Marland Kitchen atorvastatin (LIPITOR) 40 MG tablet   Oral   Take 40 mg by mouth daily.         Marland Kitchen azithromycin (ZITHROMAX) 250 MG tablet      One tab daily   4 each   0   . gabapentin (NEURONTIN) 100 MG capsule   Oral   Take 100 mg by mouth once.         Marland Kitchen lisinopril (PRINIVIL,ZESTRIL) 20 MG tablet   Oral   Take 20 mg by mouth daily.         Marland Kitchen lovastatin (MEVACOR) 40 MG tablet   Oral   Take 40 mg by mouth at bedtime.         . metFORMIN (GLUCOPHAGE) 500 MG tablet    Oral   Take by mouth 2 (two) times daily with a meal.         . omeprazole (PRILOSEC) 20 MG capsule   Oral   Take 20 mg by mouth daily.         . silodosin (RAPAFLO) 8 MG CAPS capsule   Oral   Take 8 mg by mouth daily with breakfast.         . traMADol (ULTRAM) 50 MG tablet   Oral   Take 1 tablet (50 mg total) by mouth every 6 (six) hours as needed.   20 tablet   0     Allergies:  Ivp dye and Percocet  Family History: History reviewed. No pertinent family history.  Social History: Social History  Substance Use Topics  . Smoking status: Current Every Day Smoker  . Smokeless tobacco: None  . Alcohol Use: No     Review of Systems:   10 point review of systems was performed and was otherwise negative:  Constitutional: No fever Eyes: No visual disturbances ENT: No sore throat, ear pain Cardiac: Right lower pleuritic pain Respiratory: No shortness of breath, wheezing, or stridor Abdomen: No abdominal pain, no vomiting, No  diarrhea Endocrine: No weight loss, No night sweats Extremities: No peripheral edema, cyanosis Skin: No rashes, easy bruising Neurologic: No focal weakness, trouble with speech or swollowing Urologic: No dysuria, Hematuria, or urinary frequency *  Physical Exam:  ED Triage Vitals  Enc Vitals Group     BP 11/28/15 1311 121/75 mmHg     Pulse Rate 11/28/15 1311 100     Resp 11/28/15 1311 20     Temp 11/28/15 1311 98.5 F (36.9 C)     Temp Source 11/28/15 1311 Oral     SpO2 11/28/15 1311 100 %     Weight 11/28/15 1311 164 lb (74.39 kg)     Height --      Head Cir --      Peak Flow --      Pain Score 11/28/15 1312 0     Pain Loc --      Pain Edu? --      Excl. in [place]? --     General: Awake , Alert , and Oriented times 3; GCS 15 Head: Normal cephalic , atraumatic Eyes: Pupils equal , round, reactive to light Nose/Throat: No nasal drainage, patent upper airway without erythema or exudate.Dry mucous membranes  Neck: Supple, Full  range of motion, No anterior adenopathy or palpable thyroid masses. No meningeal signs Lungs: Clear to ascultation without wheezes , rhonchi, or rales Heart: Regular rate, regular rhythm without murmurs , gallops , or rubs Abdomen: Soft, non tender without rebound, guarding , or rigidity; bowel sounds positive and symmetric in all 4 quadrants. No organomegaly .        Extremities: 2 plus symmetric pulses. No edema, clubbing or cyanosis Neurologic: normal ambulation, Motor symmetric without deficits, sensory intact Skin: warm, dry, no rashes   Labs:   All laboratory work was reviewed including any pertinent negatives or positives listed below:  Labs Reviewed  RAPID INFLUENZA A&B ANTIGENS ([place])  COMPREHENSIVE METABOLIC PANEL  CBC WITH DIFFERENTIAL/PLATELET  Reviewed the patient's laboratory work shows no significant findings  EKG: Pending   Radiology:     EXAM: CT HEAD WITHOUT CONTRAST  TECHNIQUE: Contiguous axial images were obtained from the base of the skull through the vertex without intravenous contrast.  COMPARISON: CT head without contrast 02/23/2015 and 06/01/2011.  FINDINGS: Remote lacunar infarcts are again noted adjacent to the caudate head and within the external capsule on the left. No acute cortical infarct, hemorrhage, or mass lesion is present. Mild generalized atrophy is stable. The ventricles are proportionate to the degree of atrophy. No significant extra-axial fluid collection is present.  Bilateral nasal surgery is again noted. There is persistent opacification of the sphenoid sinuses and scattered ethmoid disease. The mastoid air cells are clear. The calvarium is otherwise intact.  IMPRESSION: 1. No acute intracranial abnormality or significant interval change. 2. 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 3. Stable atrophy. 4. Sinus surgery with residual sphenoid and ethmoid  disease.   Electronically Signed By: San Morelle M.D. On: 11/28/2015 14:45          DG Chest 2 View (Final result) Result time: 11/28/15 14:39:05   Final result by Rad Results In Interface (11/28/15 14:39:05)   Narrative:   CLINICAL DATA: Cough and congestion. Fever 4 days.  EXAM: CHEST - 2 VIEW  COMPARISON: One-view chest x-ray 10/15/2013. CT of the chest 06/03/2011  FINDINGS: Heart size is normal. Atherosclerotic calcifications are present at the aortic arch. 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 There is no focal airspace disease. There is no edema or effusion. The visualized soft tissues and bony thorax are unremarkable.  IMPRESSION: 1. No acute cardiopulmonary disease. 2. Atherosclerosis. 3. Left-sided Bochdalek's hernia.   Electronically Signed       I personally reviewed the radiologic studies    ED Course: * Patient was given some IV fluids for what was interpreted be some dehydration. He still slightly hypoxic with wheezing and has a history of COPD. Patient will be given a DuoNeb and IV steroids here in emergency department to see if there is improvement of his pulse oximetry. If the patient doesn't show symptomatic improvement he may require inpatient management.    Assessment:  Acute bronchitis with bronchospasm      Plan: Monitor patient posttreatment be checked out to my colleague Dr. Laveda Abbe, MD 11/28/15 [phone]

## 2015-11-28 NOTE — ED Provider Notes (Signed)
Patient is ambulated in the hall well with no complaints of shortness breath or pain. Patient is agreeable to going home with cough medications and steroids.  Earleen Newport, MD 11/28/15 [phone]

## 2015-12-01 LAB — CULTURE, BLOOD (ROUTINE X 2)
CULTURE: NO GROWTH
Culture: NO GROWTH

## 2016-05-03 ENCOUNTER — Encounter: Payer: Self-pay | Admitting: Emergency Medicine

## 2016-05-03 ENCOUNTER — Emergency Department
Admission: EM | Admit: 2016-05-03 | Discharge: 2016-05-03 | Disposition: A | Discharge status: Home / Self Care | Payer: Medicare Other | Attending: Emergency Medicine | Admitting: Emergency Medicine

## 2016-05-03 DIAGNOSIS — J449 Chronic obstructive pulmonary disease, unspecified: Secondary | ICD-10-CM | POA: Diagnosis not present

## 2016-05-03 DIAGNOSIS — E119 Type 2 diabetes mellitus without complications: Secondary | ICD-10-CM | POA: Diagnosis not present

## 2016-05-03 DIAGNOSIS — F17200 Nicotine dependence, unspecified, uncomplicated: Secondary | ICD-10-CM | POA: Insufficient documentation

## 2016-05-03 DIAGNOSIS — I2510 Atherosclerotic heart disease of native coronary artery without angina pectoris: Secondary | ICD-10-CM | POA: Insufficient documentation

## 2016-05-03 DIAGNOSIS — M5431 Sciatica, right side: Secondary | ICD-10-CM | POA: Insufficient documentation

## 2016-05-03 DIAGNOSIS — M25551 Pain in right hip: Secondary | ICD-10-CM | POA: Diagnosis present

## 2016-05-03 DIAGNOSIS — Z7984 Long term (current) use of oral hypoglycemic drugs: Secondary | ICD-10-CM | POA: Insufficient documentation

## 2016-05-03 DIAGNOSIS — I10 Essential (primary) hypertension: Secondary | ICD-10-CM | POA: Diagnosis not present

## 2016-05-03 DIAGNOSIS — Z79899 Other long term (current) drug therapy: Secondary | ICD-10-CM | POA: Insufficient documentation

## 2016-05-03 MED ORDER — KETOROLAC TROMETHAMINE 30 MG/ML IJ SOLN: 30.0000 mg | Freq: Once | INTRAMUSCULAR | Status: DC

## 2016-05-03 MED ORDER — KETOROLAC TROMETHAMINE 30 MG/ML IJ SOLN
30.0000 mg | Freq: Once | INTRAMUSCULAR | Status: DC
  Administered 2016-05-03: 30 mg via INTRAMUSCULAR
  Filled 2016-05-03: qty 1

## 2016-05-03 NOTE — Discharge Instructions (Signed)
Please keep your appointment with orthopedics for Friday

## 2016-05-03 NOTE — ED Triage Notes (Signed)
Patient presents to the ED with right hip pain x 2 weeks.  Patient states he was recently diagnosed with sciatica and has an appointment with ortho on Friday but patient states pain is increasing and starting yesterday he states he cannot walk due to pain.  Patient has mobility to right leg but patient reports pain with movement.  Patient is in no obvious distress at this time.

## 2016-05-03 NOTE — ED Provider Notes (Signed)
[facility] Emergency Department Provider Note  ____________________________________________  Time seen: Approximately 6:23 PM  I have reviewed the triage vital signs and the nursing notes.   HISTORY  Chief Complaint Hip Pain    HPI [name] is a 75 y.o. male, NAD, presents to the emergency department for evaluation of right sciatic pain. Patient was seen last week by his PCP and was diagnosed with sciatica and was given Ultram and a muscle relaxant. Patient has been taking Aleve which normally helps but did not help today.  He states that the pain is so bad that he has had to use his cane to ambulate. Patient has an appointment Friday with his orthopedic doctor.  He denies saddle anesthesia or loss of bowel/bladder function. No chest pain, shortness of breath, abdominal pain, nausea, vomiting, numbness, weakness or tingling. Patient denies recent trauma, falls. Pain is currently 9/10.   Past Medical History:  Diagnosis Date  . COPD (chronic obstructive pulmonary disease) ([place])   . Coronary artery disease   . Diabetes mellitus without complication ([place])   . Hypertension   . TIA (transient ischemic attack)     There are no active problems to display for this patient.   History reviewed. No pertinent surgical history.  Prior to Admission medications   Medication Sig Start Date End Date Taking? Authorizing Provider  amLODipine (NORVASC) 5 MG tablet Take 5 mg by mouth daily.    Historical Provider, MD  atorvastatin (LIPITOR) 40 MG tablet Take 40 mg by mouth daily.    Historical Provider, MD  azithromycin (ZITHROMAX) 250 MG tablet One tab daily 11/26/15   Lisa Roca, MD  chlorpheniramine-HYDROcodone [facility]) 10-8 MG/5ML SUER Take 5 mLs by mouth 2 (two) times daily. 11/28/15   Earleen Newport, MD  gabapentin (NEURONTIN) 100 MG capsule Take 100 mg by mouth once.    Historical Provider, MD  lisinopril (PRINIVIL,ZESTRIL) 20 MG tablet Take  20 mg by mouth daily.    Historical Provider, MD  lovastatin (MEVACOR) 40 MG tablet Take 40 mg by mouth at bedtime.    Historical Provider, MD  metFORMIN (GLUCOPHAGE) 500 MG tablet Take by mouth 2 (two) times daily with a meal.    Historical Provider, MD  omeprazole (PRILOSEC) 20 MG capsule Take 20 mg by mouth daily.    Historical Provider, MD  predniSONE (STERAPRED UNI-PAK 21 TAB) 10 MG (21) TBPK tablet Take 1 tablet (10 mg total) by mouth daily. 11/28/15   Earleen Newport, MD  silodosin (RAPAFLO) 8 MG CAPS capsule Take 8 mg by mouth daily with breakfast.    Historical Provider, MD    Allergies Codeine; Ivp dye [iodinated diagnostic agents]; and Percocet [oxycodone-acetaminophen]  No family history on file.  Social History Social History  Substance Use Topics  . Smoking status: Current Every Day Smoker  . Smokeless tobacco: Never Used  . Alcohol use No     Review of Systems  Constitutional: No fever/chills Cardiovascular: No chest pain. Respiratory: No shortness of breath. No wheezing.  Gastrointestinal: No abdominal pain.  No nausea, vomiting.   Musculoskeletal: Right lower back pain radiating to the right leg. Skin: Negative for rash, redness, skin sores. Neurological: Negative for headaches, focal weakness or numbness. No tingling. No saddle paresthesias. No loss of bowel or bladder control. 10-point ROS otherwise negative.  ____________________________________________   PHYSICAL EXAM:  VITAL SIGNS: ED Triage Vitals  Enc Vitals Group     BP 05/03/16 1734 (!) 141/98  Pulse Rate 05/03/16 1734 97     Resp 05/03/16 1734 18     Temp 05/03/16 1734 98.3 F (36.8 C)     Temp Source 05/03/16 1734 Oral     SpO2 05/03/16 1734 96 %     Weight 05/03/16 1735 164 lb (74.4 kg)     Height 05/03/16 1735 5\' 7"  (1.702 m)     Head Circumference --      Peak Flow --      Pain Score 05/03/16 1735 9     Pain Loc --      Pain Edu? --      Excl. in [place]? --       Constitutional: Alert and oriented. Well appearing and in no acute distress. Eyes: Conjunctivae are normal without icterus or injection.  Head: Atraumatic. Neck: Supple with full range of motion. No cervical spine tenderness to palpation. Hematological/Lymphatic/Immunilogical: No cervical lymphadenopathy. Cardiovascular: Good peripheral circulation. Respiratory: Normal respiratory effort without tachypnea or retractions.  Musculoskeletal: Mild tenderness to the right lower paraspinal regions. Positive right SI joint tenderness. Positive right straight leg raise. Negative left straight leg raise. No lower extremity tenderness nor edema.  No joint effusions. 5/5 strength in bilateral lower extremities. Neurologic:  Normal speech and language. No gross focal neurologic deficits are appreciated.  Skin:  Skin is warm, dry and intact. No rash noted. Psychiatric: Mood and affect are normal. Speech and behavior are normal. Patient exhibits appropriate insight and judgement.   ____________________________________________   LABS  None ____________________________________________  EKG  None ____________________________________________  RADIOLOGY  None ____________________________________________    PROCEDURES  Procedure(s) performed: None   Procedures   Medications  ketorolac (TORADOL) 30 MG/ML injection 30 mg (30 mg Intramuscular Not Given 05/03/16 1840)     ____________________________________________   INITIAL IMPRESSION / ASSESSMENT AND PLAN / ED COURSE  Pertinent labs & imaging results that were available during my care of the patient were reviewed by me and considered in my medical decision making (see chart for details).  Clinical Course    Patient's diagnosis is consistent with sciatica of the right side. Patietn tolerated Toradol well without side effects. Patient will be discharged home with instructions to continue Aleve as needed and keep his  appointment with Orthopedics on Friday. Patient is given ED precautions to return to the ED for any worsening or new symptoms.    ____________________________________________  FINAL CLINICAL IMPRESSION(S) / ED DIAGNOSES  Final diagnoses:  Sciatica of right side      NEW MEDICATIONS STARTED DURING THIS VISIT:  Discharge Medication List as of 05/03/2016  7:00 PM           Braxton Feathers, PA-C 05/03/16 [place], MD 05/04/16 2247

## 2016-05-13 ENCOUNTER — Other Ambulatory Visit: Payer: Self-pay | Admitting: Student

## 2016-05-13 DIAGNOSIS — M5441 Lumbago with sciatica, right side: Principal | ICD-10-CM

## 2016-05-13 DIAGNOSIS — G8929 Other chronic pain: Secondary | ICD-10-CM

## 2016-05-27 ENCOUNTER — Encounter: Payer: Self-pay | Admitting: Emergency Medicine

## 2016-05-27 ENCOUNTER — Emergency Department: Payer: Medicare Other

## 2016-05-27 ENCOUNTER — Other Ambulatory Visit: Payer: Self-pay

## 2016-05-27 ENCOUNTER — Inpatient Hospital Stay: Payer: Medicare Other

## 2016-05-27 ENCOUNTER — Other Ambulatory Visit: Payer: Medicare Other

## 2016-05-27 ENCOUNTER — Ambulatory Visit
Admission: RE | Admit: 2016-05-27 | Discharge: 2016-05-27 | Disposition: A | Discharge status: Home / Self Care | Payer: Medicare Other | Source: Ambulatory Visit | Attending: Student | Admitting: Student

## 2016-05-27 ENCOUNTER — Observation Stay
Admission: EM | Admit: 2016-05-27 | Discharge: 2016-05-30 | Disposition: A | Discharge status: Home / Self Care | Payer: Medicare Other | Attending: Internal Medicine | Admitting: Internal Medicine

## 2016-05-27 DIAGNOSIS — N289 Disorder of kidney and ureter, unspecified: Secondary | ICD-10-CM | POA: Diagnosis not present

## 2016-05-27 DIAGNOSIS — I700 Atherosclerosis of aorta: Secondary | ICD-10-CM | POA: Diagnosis not present

## 2016-05-27 DIAGNOSIS — R609 Edema, unspecified: Secondary | ICD-10-CM

## 2016-05-27 DIAGNOSIS — E119 Type 2 diabetes mellitus without complications: Secondary | ICD-10-CM | POA: Insufficient documentation

## 2016-05-27 DIAGNOSIS — Z79899 Other long term (current) drug therapy: Secondary | ICD-10-CM | POA: Diagnosis not present

## 2016-05-27 DIAGNOSIS — F17200 Nicotine dependence, unspecified, uncomplicated: Secondary | ICD-10-CM | POA: Diagnosis not present

## 2016-05-27 DIAGNOSIS — Z91041 Radiographic dye allergy status: Secondary | ICD-10-CM | POA: Diagnosis not present

## 2016-05-27 DIAGNOSIS — J449 Chronic obstructive pulmonary disease, unspecified: Secondary | ICD-10-CM | POA: Insufficient documentation

## 2016-05-27 DIAGNOSIS — I10 Essential (primary) hypertension: Secondary | ICD-10-CM | POA: Diagnosis not present

## 2016-05-27 DIAGNOSIS — M5127 Other intervertebral disc displacement, lumbosacral region: Secondary | ICD-10-CM | POA: Insufficient documentation

## 2016-05-27 DIAGNOSIS — I2510 Atherosclerotic heart disease of native coronary artery without angina pectoris: Secondary | ICD-10-CM | POA: Insufficient documentation

## 2016-05-27 DIAGNOSIS — Z8673 Personal history of transient ischemic attack (TIA), and cerebral infarction without residual deficits: Secondary | ICD-10-CM | POA: Insufficient documentation

## 2016-05-27 DIAGNOSIS — Z7952 Long term (current) use of systemic steroids: Secondary | ICD-10-CM | POA: Insufficient documentation

## 2016-05-27 DIAGNOSIS — E785 Hyperlipidemia, unspecified: Secondary | ICD-10-CM | POA: Insufficient documentation

## 2016-05-27 DIAGNOSIS — M5441 Lumbago with sciatica, right side: Principal | ICD-10-CM

## 2016-05-27 DIAGNOSIS — M5430 Sciatica, unspecified side: Secondary | ICD-10-CM | POA: Insufficient documentation

## 2016-05-27 DIAGNOSIS — R7989 Other specified abnormal findings of blood chemistry: Secondary | ICD-10-CM

## 2016-05-27 DIAGNOSIS — R791 Abnormal coagulation profile: Secondary | ICD-10-CM | POA: Diagnosis present

## 2016-05-27 DIAGNOSIS — R079 Chest pain, unspecified: Secondary | ICD-10-CM

## 2016-05-27 DIAGNOSIS — K219 Gastro-esophageal reflux disease without esophagitis: Secondary | ICD-10-CM | POA: Insufficient documentation

## 2016-05-27 DIAGNOSIS — G8929 Other chronic pain: Secondary | ICD-10-CM

## 2016-05-27 DIAGNOSIS — Z885 Allergy status to narcotic agent status: Secondary | ICD-10-CM | POA: Insufficient documentation

## 2016-05-27 DIAGNOSIS — R55 Syncope and collapse: Principal | ICD-10-CM | POA: Diagnosis present

## 2016-05-27 DIAGNOSIS — M4806 Spinal stenosis, lumbar region: Secondary | ICD-10-CM | POA: Insufficient documentation

## 2016-05-27 DIAGNOSIS — J441 Chronic obstructive pulmonary disease with (acute) exacerbation: Secondary | ICD-10-CM | POA: Diagnosis present

## 2016-05-27 DIAGNOSIS — R0789 Other chest pain: Secondary | ICD-10-CM

## 2016-05-27 LAB — PROTIME-INR
INR: 0.97
Prothrombin Time: 12.9 seconds (ref 11.4–15.2)

## 2016-05-27 LAB — BASIC METABOLIC PANEL
Anion gap: 6 (ref 5–15)
BUN: 20 mg/dL (ref 6–20)
CALCIUM: 8.6 mg/dL — AB (ref 8.9–10.3)
CHLORIDE: 101 mmol/L (ref 101–111)
CO2: 26 mmol/L (ref 22–32)
CREATININE: 1.04 mg/dL (ref 0.61–1.24)
GFR calc Af Amer: >60 mL/min (ref >60)
GFR calc non Af Amer: >60 mL/min (ref >60)
GLUCOSE: 107 mg/dL — AB (ref 65–99)
Potassium: 3.7 mmol/L (ref 3.5–5.1)
Sodium: 133 mmol/L — ABNORMAL LOW (ref 135–145)

## 2016-05-27 LAB — CBC
HCT: 38.6 % — ABNORMAL LOW (ref 40.0–52.0)
HEMOGLOBIN: 12.8 g/dL — AB (ref 13.0–18.0)
MCH: 25.8 pg — AB (ref 26.0–34.0)
MCHC: 33 g/dL (ref 32.0–36.0)
MCV: 78.1 fL — AB (ref 80.0–100.0)
PLATELETS: 139 10*3/uL — AB (ref 150–440)
RBC: 4.94 MIL/uL (ref 4.40–5.90)
RDW: 15.7 % — ABNORMAL HIGH (ref 11.5–14.5)
WBC: 6.2 10*3/uL (ref 3.8–10.6)

## 2016-05-27 LAB — URINALYSIS COMPLETE WITH MICROSCOPIC (ARMC ONLY)
Bacteria, UA: NONE SEEN
Bilirubin Urine: NEGATIVE
Glucose, UA: NEGATIVE mg/dL
Hgb urine dipstick: NEGATIVE
KETONES UR: NEGATIVE mg/dL
Leukocytes, UA: NEGATIVE
Nitrite: NEGATIVE
PH: 6 (ref 5.0–8.0)
PROTEIN: NEGATIVE mg/dL
SQUAMOUS EPITHELIAL / LPF: NONE SEEN
Specific Gravity, Urine: 1.017 (ref 1.005–1.030)
WBC UA: NONE SEEN WBC/hpf (ref 0–5)

## 2016-05-27 LAB — TROPONIN I
Troponin I: 0.04 ng/mL (ref <0.03)
Troponin I: 0.05 ng/mL (ref <0.03)

## 2016-05-27 LAB — FIBRIN DERIVATIVES D-DIMER (ARMC ONLY): Fibrin derivatives D-dimer (ARMC): 1407 — ABNORMAL HIGH (ref 0–499)

## 2016-05-27 LAB — APTT: APTT: 25 s (ref 24–36)

## 2016-05-27 LAB — BRAIN NATRIURETIC PEPTIDE: B Natriuretic Peptide: 48 pg/mL (ref 0.0–100.0)

## 2016-05-27 MED ORDER — HYDROCOD POLST-CPM POLST ER 10-8 MG/5ML PO SUER
5.0000 mL | Freq: Two times a day (BID) | ORAL | Status: DC
  Administered 2016-05-27 – 2016-05-30 (×6): 5 mL via ORAL
  Filled 2016-05-27 (×6): qty 5

## 2016-05-27 MED ORDER — MORPHINE SULFATE (PF) 4 MG/ML IV SOLN
INTRAVENOUS | Status: AC
  Administered 2016-05-27: 4 mg via INTRAVENOUS
  Filled 2016-05-27: qty 1

## 2016-05-27 MED ORDER — TAMSULOSIN HCL 0.4 MG PO CAPS
0.4000 mg | ORAL_CAPSULE | Freq: Every day | ORAL | Status: DC
  Administered 2016-05-27 – 2016-05-29 (×3): 0.4 mg via ORAL
  Filled 2016-05-27 (×2): qty 1

## 2016-05-27 MED ORDER — PANTOPRAZOLE SODIUM 40 MG PO TBEC
40.0000 mg | DELAYED_RELEASE_TABLET | Freq: Every day | ORAL | Status: DC
  Administered 2016-05-27 – 2016-05-30 (×4): 40 mg via ORAL
  Filled 2016-05-27 (×3): qty 1

## 2016-05-27 MED ORDER — BUDESONIDE 0.25 MG/2ML IN SUSP
0.2500 mg | Freq: Two times a day (BID) | RESPIRATORY_TRACT | Status: DC
  Administered 2016-05-28 – 2016-05-30 (×5): 0.25 mg via RESPIRATORY_TRACT
  Filled 2016-05-27 (×5): qty 2

## 2016-05-27 MED ORDER — MORPHINE SULFATE (PF) 4 MG/ML IV SOLN
4.0000 mg | Freq: Once | INTRAVENOUS | Status: AC
  Administered 2016-05-27: 4 mg via INTRAVENOUS
  Filled 2016-05-27: qty 1

## 2016-05-27 MED ORDER — ONDANSETRON HCL 4 MG/2ML IJ SOLN
4.0000 mg | Freq: Once | INTRAMUSCULAR | Status: AC
  Administered 2016-05-27: 4 mg via INTRAVENOUS
  Filled 2016-05-27: qty 2

## 2016-05-27 MED ORDER — ATORVASTATIN CALCIUM 20 MG PO TABS
ORAL_TABLET | ORAL | Status: AC
  Administered 2016-05-27: 40 mg via ORAL
  Filled 2016-05-27: qty 2

## 2016-05-27 MED ORDER — TAMSULOSIN HCL 0.4 MG PO CAPS
ORAL_CAPSULE | ORAL | Status: AC
  Administered 2016-05-27: 0.4 mg via ORAL
  Filled 2016-05-27: qty 1

## 2016-05-27 MED ORDER — METHYLPREDNISOLONE SODIUM SUCC 125 MG IJ SOLR
INTRAMUSCULAR | Status: AC
  Administered 2016-05-27: 60 mg via INTRAVENOUS
  Filled 2016-05-27: qty 2

## 2016-05-27 MED ORDER — PANTOPRAZOLE SODIUM 40 MG PO TBEC
DELAYED_RELEASE_TABLET | ORAL | Status: AC
  Administered 2016-05-27: 40 mg via ORAL
  Filled 2016-05-27: qty 1

## 2016-05-27 MED ORDER — ASPIRIN 81 MG PO CHEW
324.0000 mg | CHEWABLE_TABLET | Freq: Once | ORAL | Status: AC
  Administered 2016-05-27: 324 mg via ORAL
  Filled 2016-05-27: qty 4

## 2016-05-27 MED ORDER — TECHNETIUM TO 99M ALBUMIN AGGREGATED
4.0000 | Freq: Once | INTRAVENOUS | Status: AC | PRN
  Administered 2016-05-27: 4.27 via INTRAVENOUS

## 2016-05-27 MED ORDER — NICOTINE 14 MG/24HR TD PT24
MEDICATED_PATCH | TRANSDERMAL | Status: AC
  Administered 2016-05-27: 14 mg via TRANSDERMAL
  Filled 2016-05-27: qty 1

## 2016-05-27 MED ORDER — SODIUM CHLORIDE 0.9 % IV BOLUS (SEPSIS)
500.0000 mL | Freq: Once | INTRAVENOUS | Status: AC
  Administered 2016-05-27: 500 mL via INTRAVENOUS

## 2016-05-27 MED ORDER — LISINOPRIL 20 MG PO TABS
20.0000 mg | ORAL_TABLET | Freq: Every day | ORAL | Status: DC
  Administered 2016-05-27 – 2016-05-30 (×4): 20 mg via ORAL
  Filled 2016-05-27 (×3): qty 1

## 2016-05-27 MED ORDER — ATORVASTATIN CALCIUM 20 MG PO TABS
40.0000 mg | ORAL_TABLET | Freq: Every day | ORAL | Status: DC
  Administered 2016-05-27 – 2016-05-30 (×4): 40 mg via ORAL
  Filled 2016-05-27 (×3): qty 2

## 2016-05-27 MED ORDER — AMLODIPINE BESYLATE 5 MG PO TABS
5.0000 mg | ORAL_TABLET | Freq: Every day | ORAL | Status: DC
  Administered 2016-05-28 – 2016-05-29 (×2): 5 mg via ORAL
  Filled 2016-05-27 (×2): qty 1

## 2016-05-27 MED ORDER — ENOXAPARIN SODIUM 80 MG/0.8ML ~~LOC~~ SOLN
75.0000 mg | Freq: Two times a day (BID) | SUBCUTANEOUS | Status: DC
  Administered 2016-05-28: 75 mg via SUBCUTANEOUS
  Filled 2016-05-27: qty 0.8

## 2016-05-27 MED ORDER — METHYLPREDNISOLONE SODIUM SUCC 125 MG IJ SOLR
60.0000 mg | Freq: Four times a day (QID) | INTRAMUSCULAR | Status: DC
  Administered 2016-05-27 – 2016-05-28 (×3): 60 mg via INTRAVENOUS
  Filled 2016-05-27 (×2): qty 2

## 2016-05-27 MED ORDER — KETOROLAC TROMETHAMINE 15 MG/ML IJ SOLN
15.0000 mg | Freq: Four times a day (QID) | INTRAMUSCULAR | Status: DC | PRN
Start: 2016-05-27 — End: 2016-05-30
  Administered 2016-05-27 – 2016-05-30 (×9): 15 mg via INTRAVENOUS
  Filled 2016-05-27 (×9): qty 1

## 2016-05-27 MED ORDER — GABAPENTIN 100 MG PO CAPS
100.0000 mg | ORAL_CAPSULE | Freq: Two times a day (BID) | ORAL | Status: DC
  Administered 2016-05-27 – 2016-05-30 (×6): 100 mg via ORAL
  Filled 2016-05-27 (×6): qty 1

## 2016-05-27 MED ORDER — TECHNETIUM TC 99M DIETHYLENETRIAME-PENTAACETIC ACID
30.0000 | Freq: Once | INTRAVENOUS | Status: AC | PRN
  Administered 2016-05-27: 31.36 via INTRAVENOUS

## 2016-05-27 MED ORDER — ENOXAPARIN SODIUM 80 MG/0.8ML ~~LOC~~ SOLN
1.0000 mg/kg | Freq: Once | SUBCUTANEOUS | Status: AC
Start: 2016-05-27 — End: 2016-05-27
  Administered 2016-05-27: 75 mg via SUBCUTANEOUS
  Filled 2016-05-27: qty 0.8

## 2016-05-27 MED ORDER — MORPHINE SULFATE (PF) 4 MG/ML IV SOLN
4.0000 mg | Freq: Once | INTRAVENOUS | Status: AC
  Administered 2016-05-27: 4 mg via INTRAVENOUS

## 2016-05-27 MED ORDER — NICOTINE 14 MG/24HR TD PT24
14.0000 mg | MEDICATED_PATCH | Freq: Every day | TRANSDERMAL | Status: DC
  Administered 2016-05-27 – 2016-05-30 (×4): 14 mg via TRANSDERMAL
  Filled 2016-05-27 (×3): qty 1

## 2016-05-27 MED ORDER — LISINOPRIL 20 MG PO TABS
ORAL_TABLET | ORAL | Status: AC
  Administered 2016-05-27: 20 mg via ORAL
  Filled 2016-05-27: qty 1

## 2016-05-27 MED ORDER — IPRATROPIUM-ALBUTEROL 0.5-2.5 (3) MG/3ML IN SOLN
3.0000 mL | Freq: Four times a day (QID) | RESPIRATORY_TRACT | Status: DC
  Administered 2016-05-28 (×2): 3 mL via RESPIRATORY_TRACT
  Filled 2016-05-27 (×2): qty 3

## 2016-05-27 NOTE — ED Notes (Signed)
MD at bedside. 

## 2016-05-27 NOTE — H&P (Signed)
[place] [facility] [place] NAME: [name]    MR#:  [id number]  DATE OF BIRTH:  August 26, 1941  DATE OF ADMISSION:  05/27/2016  PRIMARY CARE PHYSICIAN: Melodye Ped, MD   REQUESTING/REFERRING PHYSICIAN: Dr Gonzella Lex  CHIEF COMPLAINT:   Chief Complaint  Patient presents with  . Loss of Consciousness  . Chest Pain   The patient did not lose consciousness  HISTORY OF PRESENT ILLNESS:  [name]  is a 75 y.o. male presents with chest pain and near syncope. Patient states that he's been having problems with his right leg going on for 5 weeks and is unable to walk very well. He just woke up from a nap on his couch and he started getting sweaty nauseous and dizzy. He asked his wife for some water and drank some water then he had to have a bowel movement so he crawled into the bathroom and had a bowel movement. Then he felt like he was going to black out. Then he developed some left upper chest pain. And crawled into the hallway and that's where EMS picked him up. The patient just had an MRI which was ordered as outpatient by his physician. The ER physician thought the patient may have a blood clot and ordered a VQ scan.  PAST MEDICAL HISTORY:   Past Medical History:  Diagnosis Date  . COPD (chronic obstructive pulmonary disease) ([place])   . Coronary artery disease   . Diabetes mellitus without complication ([place])   . Hypertension   . TIA (transient ischemic attack)     PAST SURGICAL HISTORY:  History reviewed. No pertinent surgical history.  SOCIAL HISTORY:   Social History  Substance Use Topics  . Smoking status: Current Every Day Smoker  . Smokeless tobacco: Never Used  . Alcohol use No    FAMILY HISTORY:  No family history on file.  DRUG ALLERGIES:   Allergies  Allergen Reactions  . Codeine   . Ivp Dye [Iodinated Diagnostic Agents] Hives and Other (See Comments)    dizzy  . Percocet [Oxycodone-Acetaminophen]  Hives and Nausea Only    REVIEW OF SYSTEMS:  CONSTITUTIONAL: No fever. Positive for weight loss then starting to gain it back. Positive for weakness. EYES: No blurred or double vision.  EARS, NOSE, AND THROAT: No tinnitus or ear pain. Positive for sore throat with the Symbicort RESPIRATORY: No cough, positive for shortness of breath, no wheezing or hemoptysis.  CARDIOVASCULAR: Positive for chest pain, no orthopnea, edema.  GASTROINTESTINAL: Positive for nausea. No vomiting, diarrhea or abdominal pain. No blood in bowel movements GENITOURINARY: No dysuria, hematuria.  ENDOCRINE: No polyuria, nocturia,  HEMATOLOGY: No anemia, easy bruising or bleeding SKIN: No rash or lesion. MUSCULOSKELETAL: Positive for right leg pain.   NEUROLOGIC: Near syncope PSYCHIATRY: No anxiety or depression.   MEDICATIONS AT HOME:   Prior to Admission medications   Medication Sig Start Date End Date Taking? Authorizing Provider  amLODipine (NORVASC) 5 MG tablet Take 5 mg by mouth daily.   Yes Historical Provider, MD  atorvastatin (LIPITOR) 40 MG tablet Take 40 mg by mouth daily.   Yes Historical Provider, MD  gabapentin (NEURONTIN) 100 MG capsule Take 100 mg by mouth once.   Yes Historical Provider, MD  lisinopril (PRINIVIL,ZESTRIL) 20 MG tablet Take 20 mg by mouth daily.   Yes Historical Provider, MD  metFORMIN (GLUCOPHAGE) 500 MG tablet Take by mouth 2 (two) times daily with a meal.   Yes Historical Provider,  MD  omeprazole (PRILOSEC) 20 MG capsule Take 20 mg by mouth daily.   Yes Historical Provider, MD  tiotropium (SPIRIVA) 18 MCG inhalation capsule Place 1 capsule into inhaler and inhale daily. 12/07/15  Yes Historical Provider, MD  azithromycin (ZITHROMAX) 250 MG tablet One tab daily 11/26/15   Lisa Roca, MD  budesonide-formoterol [facility]) 160-4.5 MCG/ACT inhaler Inhale 2 puffs into the lungs 2 (two) times daily.    Historical Provider, MD  chlorpheniramine-HYDROcodone (TUSSIONEX PENNKINETIC ER) 10-8  MG/5ML SUER Take 5 mLs by mouth 2 (two) times daily. 11/28/15   Earleen Newport, MD  lovastatin (MEVACOR) 40 MG tablet Take 40 mg by mouth at bedtime.    Historical Provider, MD  predniSONE (STERAPRED UNI-PAK 21 TAB) 10 MG (21) TBPK tablet Take 1 tablet (10 mg total) by mouth daily. 11/28/15   Earleen Newport, MD  silodosin (RAPAFLO) 8 MG CAPS capsule Take 8 mg by mouth daily with breakfast.    Historical Provider, MD      VITAL SIGNS:  Blood pressure (!) 160/93, pulse 86, temperature 97.8 F (36.6 C), temperature source Oral, resp. rate 13, height 5\' 7"  (1.702 m), weight 74.4 kg (164 lb), SpO2 95 %.  PHYSICAL EXAMINATION:  GENERAL:  75 y.o.-year-old patient lying in the bed with no acute distress.  EYES: Right pupil reactive to light. No scleral icterus. Extraocular muscles intact.  HEENT: Head atraumatic, normocephalic. Oropharynx and nasopharynx clear.  NECK:  Supple, no jugular venous distention. No thyroid enlargement, no tenderness.  LUNGS: Decreased breath sounds bilaterally, positive expiratory wheezing. No rales,rhonchi or crepitation. No use of accessory muscles of respiration.  CARDIOVASCULAR: S1, S2 normal. No murmurs, rubs, or gallops.  ABDOMEN: Soft, nontender, nondistended. Bowel sounds present. No organomegaly or mass.  EXTREMITIES: No pedal edema, cyanosis, or clubbing.  NEUROLOGIC: Cranial nerves II through XII are intact. Muscle strength 5/5 in all extremities. Patient able to straight leg raise bilaterally Sensation intact. Gait not checked.  PSYCHIATRIC: The patient is alert and oriented x 3.  SKIN: No rash, lesion, or ulcer.   LABORATORY PANEL:   CBC  Recent Labs Lab 05/27/16 1623  WBC 6.2  HGB 12.8*  HCT 38.6*  PLT 139*   ------------------------------------------------------------------------------------------------------------------  Chemistries   Recent Labs Lab 05/27/16 1623  NA 133*  K 3.7  CL 101  CO2 26  GLUCOSE 107*  BUN 20   CREATININE 1.04  CALCIUM 8.6*   ------------------------------------------------------------------------------------------------------------------  Cardiac Enzymes  Recent Labs Lab 05/27/16 1623  TROPONINI 0.04*   ------------------------------------------------------------------------------------------------------------------  RADIOLOGY:  Dg Chest 2 View  Result Date: 05/27/2016 CLINICAL DATA:  Syncope and chest pain today. EXAM: CHEST  2 VIEW COMPARISON:  CT chest 06/03/2011.  PA and lateral chest 11/28/2015. FINDINGS: The lungs are emphysematous but clear. Heart size is normal. Aortic atherosclerosis is seen. 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 No focal bony abnormality. IMPRESSION: No acute disease. Emphysema. Atherosclerosis. 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 Electronically Signed   By: Inge Rise M.D.   On: 05/27/2016 16:45   Mr Lumbar Spine Wo Contrast  Result Date: 05/27/2016 CLINICAL DATA:  Low back pain and bilateral leg pain for 6 weeks. Injury while lifting furniture. EXAM: MRI LUMBAR SPINE WITHOUT CONTRAST TECHNIQUE: Multiplanar, multisequence MR imaging of the lumbar spine was performed. No intravenous contrast was administered. COMPARISON:  None. FINDINGS: Segmentation: Normal lumbar segmentation is assumed, with the lowest fully formed disc space designated L5-S1. Alignment:  Lumbar spine straightening.  No listhesis. Vertebrae: Preserved vertebral body  heights without evidence of fracture, focal osseous lesion, or significant marrow edema. Mild degenerative endplate changes at 075-GRM. Conus medullaris: Extends to the L1 level and appears normal. Paraspinal and other soft tissues: Numerous T2 hyperintense lesions in both kidneys, measuring up to 2.4 cm in size and incompletely visualized. There are also T2 hypointense subcentimeter lesions in the right lower pole and interpolar left kidney which may represent hemorrhagic cysts or calcification. Disc levels: T12-L1,  L1-2, and L2-3:  No disc herniation or stenosis. L3-4: Disc desiccation. Circumferential disc bulging and mild facet and ligamentum flavum hypertrophy result in mild bilateral lateral recess stenosis, mild spinal stenosis, and mild right and moderate left neural foraminal stenosis. L4-5: Disc desiccation and mild disc space narrowing. Prominent circumferential disc bulging, small left subarticular disc protrusion with annular fissure, and mild facet and ligamentum flavum hypertrophy result in mild spinal stenosis, moderate left greater than right lateral recess stenosis, and moderate bilateral neural foraminal stenosis. There may be left L5 nerve root impingement in the lateral recess. L5-S1: Disc desiccation and mild disc space narrowing. Moderately large right paracentral disc protrusion the results in severe right and mild left lateral recess stenosis with likely right S1 nerve root impingement. Disc bulging asymmetric to the left results in moderate left neural foraminal stenosis. IMPRESSION: 1. Moderately large L5-S1 disc protrusion with likely right S1 nerve impingement in the lateral recess. 2. 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 3. Moderate neural foraminal stenosis on the left at L3-4 and L5-S1 and bilaterally at L4-5. 4. Numerous bilateral renal lesions compatible with cysts. Some may be hemorrhagic or calcified. Consider further evaluation with renal ultrasound. Electronically Signed   By: Logan Bores M.D.   On: 05/27/2016 12:31    EKG:   Sinus rhythm 85 bpm Q waves anteriorly, VPC, LAE, LVH  IMPRESSION AND PLAN:   1. COPD exacerbation.  start IV Solu-Medrol, DuoNeb nebulizers and budesonide nebulizers. Start doxycycline 2. Near syncope. Give IV fluid. Check orthostatic vital signs. Could be vasovagal syncope with having a bowel movement. 3. Chest pain. ER physician ordered a VQ scan and put on Lovenox high-dose. 4. Right leg pain  and radiculopathy. Steroid should help. Physical therapy evaluation. I'm concerned that he's been crawling around at home. 5. Hyperlipidemia unspecified continue statin 6. Essential hypertension continue blood pressure medications 7. GERD on omeprazole 8. Type 2 diabetes mellitus hold metformin and put on sliding scale 9. BPH on Rapaflo    All the records are reviewed and case discussed with ED provider. Management plans discussed with the patient, family and they are in agreement.  CODE STATUS: Full code  TOTAL TIME TAKING CARE OF THIS PATIENT: 50 minutes.    Loletha Grayer M.D on 05/27/2016 at 7:31 PM  Between 7am to 6pm - Pager - [phone]  After 6pm call admission pager [phone]  [employer] Office  [phone]  CC: Primary care physician; Melodye Ped, MD

## 2016-05-27 NOTE — ED Notes (Signed)
Patient transported to X-ray 

## 2016-05-27 NOTE — ED Provider Notes (Addendum)
[facility] Emergency Department Provider Note  ____________________________________________  Time seen: Approximately 4:44 PM  I have reviewed the triage vital signs and the nursing notes.   HISTORY  Chief Complaint Loss of Consciousness and Chest Pain   HPI [name] is a 75 y.o. male h/o COPD, DVT, CAD, diabetes, hypertension, TIA presents for evaluation of a syncopal episode and chest pain. Patient was sitting on the couch taking a nap. He woke up and felt chest pressure, diaphoresis and nausea. He drank some water and and went to the bathroom to have a bowel movement. While sitting in the toilet patient fell dizzy with tunnel vision and felt like he was going to pass out. He does not remember if he fully passed out. His wife was there called EMS. Patient reports that the chest pressure has now become a dull mild pain, 5/10, located in the left side of his chest, nonradiating. He denies current dizziness, shortness of breath, nausea, vomiting. Patient reports he has never had anything like this before. He is a smoker. No personal or family history of ischemic heart disease. 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 No hemoptysis, no leg pain or swelling, no recent travel or immobilization.   Past Medical History:  Diagnosis Date  . COPD (chronic obstructive pulmonary disease) ([place])   . Coronary artery disease   . Diabetes mellitus without complication ([place])   . Hypertension   . TIA (transient ischemic attack)     There are no active problems to display for this patient.   History reviewed. No pertinent surgical history.  Prior to Admission medications   Medication Sig Start Date End Date Taking? Authorizing Provider  amLODipine (NORVASC) 5 MG tablet Take 5 mg by mouth daily.    Historical Provider, MD  atorvastatin (LIPITOR) 40 MG tablet Take 40 mg by mouth daily.    Historical Provider, MD  azithromycin  (ZITHROMAX) 250 MG tablet One tab daily 11/26/15   Lisa Roca, MD  chlorpheniramine-HYDROcodone [facility] ER) 10-8 MG/5ML SUER Take 5 mLs by mouth 2 (two) times daily. 11/28/15   Earleen Newport, MD  gabapentin (NEURONTIN) 100 MG capsule Take 100 mg by mouth once.    Historical Provider, MD  lisinopril (PRINIVIL,ZESTRIL) 20 MG tablet Take 20 mg by mouth daily.    Historical Provider, MD  lovastatin (MEVACOR) 40 MG tablet Take 40 mg by mouth at bedtime.    Historical Provider, MD  metFORMIN (GLUCOPHAGE) 500 MG tablet Take by mouth 2 (two) times daily with a meal.    Historical Provider, MD  omeprazole (PRILOSEC) 20 MG capsule Take 20 mg by mouth daily.    Historical Provider, MD  predniSONE (STERAPRED UNI-PAK 21 TAB) 10 MG (21) TBPK tablet Take 1 tablet (10 mg total) by mouth daily. 11/28/15   Earleen Newport, MD  silodosin (RAPAFLO) 8 MG CAPS capsule Take 8 mg by mouth daily with breakfast.    Historical Provider, MD    Allergies Codeine; Ivp dye [iodinated diagnostic agents]; and Percocet [oxycodone-acetaminophen]  No family history on file.  Social History Social History  Substance Use Topics  . Smoking status: Current Every Day Smoker  . Smokeless tobacco: Never Used  . Alcohol use No    Review of Systems Constitutional: Negative for fever. Eyes: Negative for visual changes. ENT: Negative for sore throat. Cardiovascular: + chest pain and syncope Respiratory: Negative for shortness of breath. Gastrointestinal:  Negative for abdominal pain, vomiting or diarrhea. Genitourinary: Negative for dysuria. Musculoskeletal: Negative for back pain. + left leg pain Skin: Negative for rash. Neurological: Negative for headaches, weakness or numbness.  ____________________________________________   PHYSICAL EXAM:  VITAL SIGNS: ED Triage Vitals [05/27/16 1619]  Enc Vitals Group     BP (!) 171/94     Pulse Rate 82     Resp 15     Temp 97.8 F (36.6 C)     Temp  Source Oral     SpO2 99 %     Weight 164 lb (74.4 kg)     Height 5\' 7"  (1.702 m)     Head Circumference      Peak Flow      Pain Score 9     Pain Loc      Pain Edu?      Excl. in [place]?     Constitutional: Alert and oriented. Well appearing and in no apparent distress. HEENT:      Head: Normocephalic and atraumatic.         Eyes: Conjunctivae are normal. Sclera is non-icteric. EOMI. PERRL      Mouth/Throat: Mucous membranes are moist.       Neck: Supple with no signs of meningismus. Cardiovascular: Regular rate and rhythm. No murmurs, gallops, or rubs. 2+ symmetrical distal pulses are present in all extremities. No JVD. Respiratory: Normal respiratory effort. Lungs are clear to auscultation bilaterally. No wheezes, crackles, or rhonchi.  Gastrointestinal: Soft, non tender, and non distended with positive bowel sounds. No rebound or guarding. Genitourinary: No CVA tenderness. Musculoskeletal: Nontender with normal range of motion in all extremities. No edema, cyanosis, or erythema of extremities. Neurologic: Normal speech and language. Face is symmetric. Moving all extremities. No gross focal neurologic deficits are appreciated. Skin: Skin is warm, dry and intact. No rash noted. Psychiatric: Mood and affect are normal. Speech and behavior are normal.  ____________________________________________   LABS (all labs ordered are listed, but only abnormal results are displayed)  Labs Reviewed  BASIC METABOLIC PANEL - Abnormal; Notable for the following:       Result Value   Sodium 133 (*)    Glucose, Bld 107 (*)    Calcium 8.6 (*)    All other components within normal limits  CBC - Abnormal; Notable for the following:    Hemoglobin 12.8 (*)    HCT 38.6 (*)    MCV 78.1 (*)    MCH 25.8 (*)    RDW 15.7 (*)    Platelets 139 (*)    All other components within normal limits  URINALYSIS COMPLETEWITH MICROSCOPIC ([facility] ONLY) - Abnormal; Notable for the following:    Color, Urine YELLOW  (*)    APPearance CLEAR (*)    All other components within normal limits  TROPONIN I - Abnormal; Notable for the following:    Troponin I 0.04 (*)    All other components within normal limits  FIBRIN DERIVATIVES D-DIMER ([facility] ONLY) - Abnormal; Notable for the following:    Fibrin derivatives D-dimer ([facility]) 1,407 (*)    All other components within normal limits  BRAIN NATRIURETIC PEPTIDE  CBG MONITORING, ED   ____________________________________________  EKG  ED ECG REPORT I, Rudene Re, the attending physician, personally viewed and interpreted this ECG. Sinus rhythm, rate of 85, normal intervals, normal axis, no ST elevations or depressions. Unchanged from prior. ____________________________________________  RADIOLOGY  CXR:  No acute disease.  Emphysema.  Atherosclerosis.  Bochdalek's hernia on  the left. ____________________________________________   PROCEDURES  Procedure(s) performed: None Procedures Critical Care performed:  Yes  CRITICAL CARE Performed by: Rudene Re  ?  Total critical care time: 35 min  Critical care time was exclusive of separately billable procedures and treating other patients.  Critical care was necessary to treat or prevent imminent or life-threatening deterioration.  Critical care was time spent personally by me on the following activities: development of treatment plan with patient and/or surrogate as well as nursing, discussions with consultants, evaluation of patient's response to treatment, examination of patient, obtaining history from patient or surrogate, ordering and performing treatments and interventions, ordering and review of laboratory studies, ordering and review of radiographic studies, pulse oximetry and re-evaluation of patient's condition.  ____________________________________________   INITIAL IMPRESSION / ASSESSMENT AND PLAN / ED COURSE  75 y.o. male h/o COPD, DVT, CAD, diabetes, hypertension,  TIA presents for evaluation of a syncopal episode and chest pain. Patient continues to complain of mild chest pain. He is neurologically intact. Vital signs are within normal limits. Lungs are clear to auscultation. EKG with no evidence of ischemia. D-dimer elevated at 1407 with mildly elevated troponin is 0.04. I do believe patient's symptoms are concerning for acute pulmonary embolism. No hemodynamic instability. He does have allergy to IV contrast. We'll give patient Lovenox and call radiology for a VQ scan. We'll admit to the hospitalist service.  Clinical Course   _________________________ 6:38 PM on 05/27/2016 ----------------------------------------- Spoke with no clear medicine and they have to wait for the dye to come from [facility] and will not be able to do the study until later this evening. Patient remains stable. I have discussed with the hospitalist for admission at this time. Patient has received subcutaneous Lovenox.   Pertinent labs & imaging results that were available during my care of the patient were reviewed by me and considered in my medical decision making (see chart for details).    ____________________________________________   FINAL CLINICAL IMPRESSION(S) / ED DIAGNOSES  Final diagnoses:  Chest pain  Syncope, unspecified syncope type  Elevated d-dimer      NEW MEDICATIONS STARTED DURING THIS VISIT:  New Prescriptions   No medications on file     Note:  This document was prepared using Dragon voice recognition software and may include unintentional dictation errors.    Rudene Re, MD 05/27/16 1839    Rudene Re, MD 05/27/16 2040

## 2016-05-27 NOTE — Progress Notes (Signed)
ANTICOAGULATION CONSULT NOTE - Initial Consult  Pharmacy Consult for Enoxaparin Indication: pulmonary embolus  Allergies  Allergen Reactions  . Codeine   . Ivp Dye [Iodinated Diagnostic Agents] Hives and Other (See Comments)    dizzy  . Percocet [Oxycodone-Acetaminophen] Hives and Nausea Only    Patient Measurements: Height: 5\' 7"  (170.2 cm) Weight: 164 lb (74.4 kg) IBW/kg (Calculated) : 66.1 Heparin Dosing Weight: 74.4kg  Vital Signs: Temp: 97.8 F (36.6 C) (09/08 1619) Temp Source: Oral (09/08 1619) BP: 160/93 (09/08 1845) Pulse Rate: 86 (09/08 1845)  Labs:  Recent Labs  05/27/16 1623  HGB 12.8*  HCT 38.6*  PLT 139*  CREATININE 1.04  TROPONINI 0.04*    Estimated Creatinine Clearance: 59.1 mL/min (by C-G formula based on SCr of 1.04 mg/dL).   Medical History: Past Medical History:  Diagnosis Date  . COPD (chronic obstructive pulmonary disease) ([place])   . Coronary artery disease   . Diabetes mellitus without complication ([place])   . Hypertension   . TIA (transient ischemic attack)     Medications:  Scheduled:  . [START ON 05/28/2016] amLODipine  5 mg Oral Daily  . atorvastatin  40 mg Oral Daily  . budesonide (PULMICORT) nebulizer solution  0.25 mg Nebulization BID  . chlorpheniramine-HYDROcodone  5 mL Oral BID  . gabapentin  100 mg Oral BID  . ipratropium-albuterol  3 mL Nebulization Q6H  . lisinopril  20 mg Oral Daily  . methylPREDNISolone (SOLU-MEDROL) injection  60 mg Intravenous Q6H  . nicotine  14 mg Transdermal Daily  . pantoprazole  40 mg Oral Daily  . tamsulosin  0.4 mg Oral QPC supper   Infusions:   PRN:   Assessment: 75 yo male who presents to the ED with chest pain thought to possibly have a PE.   Goal of Therapy:  Monitor platelets by anticoagulation protocol: Yes  Monitor SCR/CBC and signs and symptoms of bleeding.    Plan:  Lovenox 75mg  subQ [place], PharmD Clinical Pharmacist  05/27/2016,7:49 PM

## 2016-05-27 NOTE — ED Triage Notes (Addendum)
Pt comes in via EMS from home c/o syncopal episode and chest pain.  Patient was in the bathroom having a bowel movement when "I felt everything going [place]".  Patient was able to get on the floor and crawl from the bathroom into the hallway where he was found to have had a syncopal episode  Patient started having chest pain with dull pressure aas he was riding in the EMS.  Patient has been diagnosed with sciatica and has had decreased mobility, sleep, and diet for about a week.  171/97, 83 HR, 97%, CBG 111.

## 2016-05-28 ENCOUNTER — Inpatient Hospital Stay
Admit: 2016-05-28 | Discharge: 2016-05-28 | Disposition: A | Discharge status: Home / Self Care | Payer: Medicare Other | Attending: Internal Medicine | Admitting: Internal Medicine

## 2016-05-28 DIAGNOSIS — R55 Syncope and collapse: Secondary | ICD-10-CM | POA: Diagnosis not present

## 2016-05-28 LAB — BASIC METABOLIC PANEL
ANION GAP: 5 (ref 5–15)
BUN: 18 mg/dL (ref 6–20)
CHLORIDE: 102 mmol/L (ref 101–111)
CO2: 26 mmol/L (ref 22–32)
CREATININE: 0.96 mg/dL (ref 0.61–1.24)
Calcium: 8.4 mg/dL — ABNORMAL LOW (ref 8.9–10.3)
GFR calc non Af Amer: >60 mL/min (ref >60)
Glucose, Bld: 235 mg/dL — ABNORMAL HIGH (ref 65–99)
POTASSIUM: 4.2 mmol/L (ref 3.5–5.1)
SODIUM: 133 mmol/L — AB (ref 135–145)

## 2016-05-28 LAB — CBC
HEMATOCRIT: 36.3 % — AB (ref 40.0–52.0)
HEMOGLOBIN: 12.2 g/dL — AB (ref 13.0–18.0)
MCH: 26 pg (ref 26.0–34.0)
MCHC: 33.5 g/dL (ref 32.0–36.0)
MCV: 77.6 fL — AB (ref 80.0–100.0)
PLATELETS: 129 10*3/uL — AB (ref 150–440)
RBC: 4.68 MIL/uL (ref 4.40–5.90)
RDW: 15.7 % — ABNORMAL HIGH (ref 11.5–14.5)
WBC: 6.2 10*3/uL (ref 3.8–10.6)

## 2016-05-28 LAB — TROPONIN I: TROPONIN I: 0.05 ng/mL — AB (ref <0.03)

## 2016-05-28 MED ORDER — PREDNISONE 20 MG PO TABS
40.0000 mg | ORAL_TABLET | Freq: Every day | ORAL | Status: DC
  Administered 2016-05-29 – 2016-05-30 (×2): 40 mg via ORAL
  Filled 2016-05-28 (×2): qty 2

## 2016-05-28 MED ORDER — CYCLOBENZAPRINE HCL 10 MG PO TABS
5.0000 mg | ORAL_TABLET | Freq: Three times a day (TID) | ORAL | Status: DC
  Administered 2016-05-28 – 2016-05-30 (×6): 5 mg via ORAL
  Filled 2016-05-28 (×6): qty 1

## 2016-05-28 NOTE — Care Management CC44 (Signed)
Condition Code 44 Documentation Completed  Patient Details  Name: [name] MRN: [id number] Date of Birth: 1941-04-08   Condition Code 44 given:   yes Patient signature on Condition Code 44 notice: yes    Documentation of 2 MD's agreement: yes   Code 44 added to claim:   yes    Ival Bible, RN 05/28/2016, 4:27 PM

## 2016-05-28 NOTE — Progress Notes (Addendum)
[facility] [place] NAME: Tykee Hatchel    MRN#:  [id number]  DATE OF BIRTH:  [birth year]  SUBJECTIVE:  Hospital Day: 1 day Damarien Welsch is a 75 y.o. male presenting with Loss of Consciousness and Chest Pain .   Overnight events: No Overnight events Interval Events: Still complains of right-sided leg pain  REVIEW OF SYSTEMS:  CONSTITUTIONAL: No fever, fatigue or weakness.  EYES: No blurred or double vision.  EARS, NOSE, AND THROAT: No tinnitus or ear pain.  RESPIRATORY: No cough, shortness of breath, wheezing or hemoptysis.  CARDIOVASCULAR: No chest pain, orthopnea, edema.  GASTROINTESTINAL: No nausea, vomiting, diarrhea or abdominal pain.  GENITOURINARY: No dysuria, hematuria.  ENDOCRINE: No polyuria, nocturia,  HEMATOLOGY: No anemia, easy bruising or bleeding SKIN: No rash or lesion. MUSCULOSKELETAL: No joint pain or arthritis.  Positive leg pain NEUROLOGIC: No tingling, numbness, weakness.  PSYCHIATRY: No anxiety or depression.   DRUG ALLERGIES:   Allergies  Allergen Reactions  . Codeine   . Ivp Dye [Iodinated Diagnostic Agents] Hives and Other (See Comments)    dizzy  . Percocet [Oxycodone-Acetaminophen] Hives and Nausea Only    VITALS:  Blood pressure (!) 160/89, pulse (!) 106, temperature 97.7 F (36.5 C), temperature source Oral, resp. rate 20, height 5\' 7"  (1.702 m), weight 72.9 kg (160 lb 12.8 oz), SpO2 97 %.  PHYSICAL EXAMINATION:  VITAL SIGNS: Vitals:   05/28/16 0912 05/28/16 1120  BP: (!) 162/96 (!) 160/89  Pulse: (!) 116 (!) 106  Resp: 20 20  Temp:  97.7 F (36.5 C)   GENERAL:74 y.o.male currently in no acute distress.  HEAD: Normocephalic, atraumatic.  EYES: Pupils equal, round, reactive to light. Extraocular muscles intact. No scleral icterus.  MOUTH: Moist mucosal membrane. Dentition intact. No abscess noted.  EAR, NOSE, THROAT: Clear without exudates. No external lesions.  NECK: Supple. No  thyromegaly. No nodules. No JVD.  PULMONARY: Clear to ascultation, without wheeze rails or rhonci. No use of accessory muscles, Good respiratory effort. good air entry bilaterally CHEST: Nontender to palpation.  CARDIOVASCULAR: S1 and S2. Regular rate and rhythm. No murmurs, rubs, or gallops. No edema. Pedal pulses 2+ bilaterally.  GASTROINTESTINAL: Soft, nontender, nondistended. No masses. Positive bowel sounds. No hepatosplenomegaly.  MUSCULOSKELETAL: No swelling, clubbing, or edema. Range of motion limited in right leg given pain  NEUROLOGIC: Cranial nerves II through XII are intact. No gross focal neurological deficits. Sensation intact. Reflexes intact.  SKIN: No ulceration, lesions, rashes, or cyanosis. Skin warm and dry. Turgor intact.  PSYCHIATRIC: Mood, affect within normal limits. The patient is awake, alert and oriented x 3. Insight, judgment intact.      LABORATORY PANEL:   CBC  Recent Labs Lab 05/28/16 0104  WBC 6.2  HGB 12.2*  HCT 36.3*  PLT 129*   ------------------------------------------------------------------------------------------------------------------  Chemistries   Recent Labs Lab 05/28/16 0104  NA 133*  K 4.2  CL 102  CO2 26  GLUCOSE 235*  BUN 18  CREATININE 0.96  CALCIUM 8.4*   ------------------------------------------------------------------------------------------------------------------  Cardiac Enzymes  Recent Labs Lab 05/28/16 0104  TROPONINI 0.05*   ------------------------------------------------------------------------------------------------------------------  RADIOLOGY:  Dg Chest 2 View  Result Date: 05/27/2016 CLINICAL DATA:  Syncope and chest pain today. EXAM: CHEST  2 VIEW COMPARISON:  CT chest 06/03/2011.  PA and lateral chest 11/28/2015. FINDINGS: The lungs are emphysematous but clear. Heart size is normal. Aortic atherosclerosis is seen. 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 No focal bony abnormality.  IMPRESSION:  No acute disease. Emphysema. Atherosclerosis. 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 Electronically Signed   By: Inge Rise M.D.   On: 05/27/2016 16:45   Mr Lumbar Spine Wo Contrast  Result Date: 05/27/2016 CLINICAL DATA:  Low back pain and bilateral leg pain for 6 weeks. Injury while lifting furniture. EXAM: MRI LUMBAR SPINE WITHOUT CONTRAST TECHNIQUE: Multiplanar, multisequence MR imaging of the lumbar spine was performed. No intravenous contrast was administered. COMPARISON:  None. FINDINGS: Segmentation: Normal lumbar segmentation is assumed, with the lowest fully formed disc space designated L5-S1. Alignment:  Lumbar spine straightening.  No listhesis. Vertebrae: Preserved vertebral body heights without evidence of fracture, focal osseous lesion, or significant marrow edema. Mild degenerative endplate changes at 075-GRM. Conus medullaris: Extends to the L1 level and appears normal. Paraspinal and other soft tissues: Numerous T2 hyperintense lesions in both kidneys, measuring up to 2.4 cm in size and incompletely visualized. There are also T2 hypointense subcentimeter lesions in the right lower pole and interpolar left kidney which may represent hemorrhagic cysts or calcification. Disc levels: T12-L1, L1-2, and L2-3:  No disc herniation or stenosis. L3-4: Disc desiccation. Circumferential disc bulging and mild facet and ligamentum flavum hypertrophy result in mild bilateral lateral recess stenosis, mild spinal stenosis, and mild right and moderate left neural foraminal stenosis. L4-5: Disc desiccation and mild disc space narrowing. Prominent circumferential disc bulging, small left subarticular disc protrusion with annular fissure, and mild facet and ligamentum flavum hypertrophy result in mild spinal stenosis, moderate left greater than right lateral recess stenosis, and moderate bilateral neural foraminal stenosis. There may be left L5 nerve root impingement in the lateral recess. L5-S1: Disc desiccation and  mild disc space narrowing. Moderately large right paracentral disc protrusion the results in severe right and mild left lateral recess stenosis with likely right S1 nerve root impingement. Disc bulging asymmetric to the left results in moderate left neural foraminal stenosis. IMPRESSION: 1. Moderately large L5-S1 disc protrusion with likely right S1 nerve impingement in the lateral recess. 2. 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 3. Moderate neural foraminal stenosis on the left at L3-4 and L5-S1 and bilaterally at L4-5. 4. Numerous bilateral renal lesions compatible with cysts. Some may be hemorrhagic or calcified. Consider further evaluation with renal ultrasound. Electronically Signed   By: Logan Bores M.D.   On: 05/27/2016 12:31   Nm Pulmonary Perf And Vent  Result Date: 05/27/2016 CLINICAL DATA:  Acute onset of generalized chest pain. Initial encounter. EXAM: NUCLEAR MEDICINE VENTILATION - PERFUSION LUNG SCAN TECHNIQUE: Ventilation images were obtained in multiple projections using inhaled aerosol Tc-16m DTPA. Perfusion images were obtained in multiple projections after intravenous injection of Tc-95m MAA. RADIOPHARMACEUTICALS:  31.36 mCi Technetium-31m DTPA aerosol inhalation and 4.27 mCi Technetium-37m MAA IV COMPARISON:  Chest radiograph performed earlier today at 4:28 p.m. FINDINGS: Ventilation: Multiple areas of decreased ventilation are seen within both lungs, possibly reflecting underlying emphysematous change. Perfusion: No significant wedge-shaped peripheral perfusion defects are seen to suggest acute pulmonary embolus. Vague areas of decreased perfusion within both lungs appear to correspond to ventilation defects, and are less prominent than on ventilation images. IMPRESSION: Low probability for pulmonary embolus. Multiple areas of decreased ventilation within both lungs may reflect the patient's underlying emphysematous change.  Electronically Signed   By: Garald Balding M.D.   On: 05/27/2016 23:09    EKG:   Orders placed or performed during the hospital encounter of 05/27/16  . ED EKG  .  ED EKG    ASSESSMENT AND PLAN:   Elrey Capozza is a 75 y.o. male presenting with Loss of Consciousness and Chest Pain . Admitted 05/27/2016 : Day #: 1 day  1. Near syncope: No acute telemetry issues 2. Sciatica: Continue steroids physical therapy pain medication, muscle relaxers 3. Hyperlipidemia unspecified: Statin therapy 4. Essential hypertension: Continue home medications 5. GERD without esophagitis: PPI therapy 6. COPD no evidence of COPD exacerbation at this time  Disposition: Physical therapy, anticipate discharge tomorrow with home health  All the records are reviewed and case discussed with Care Management/Social Workerr. Management plans discussed with the patient, family and they are in agreement.  CODE STATUS: full TOTAL TIME TAKING CARE OF THIS PATIENT: 28 minutes.   POSSIBLE D/C IN 1-2DAYS, DEPENDING ON CLINICAL CONDITION.   Elder Davidian,  Karenann Cai.D on 05/28/2016 at 1:04 PM  Between 7am to 6pm - Pager - [phone]  After 6pm: House Pager: - [phone]  [facility]  Office  [phone]  CC: Primary care physician; Melodye Ped, MD

## 2016-05-28 NOTE — Progress Notes (Signed)
Arrival Method: via stretcher with ED tech Mental Orientation: A&O Telemetry: [id number], verified with Thayer Headings, NT Skin: Intact, verified with Iran Sizer, RN IV: 20g left AC Pain: 8 out of 10 right leg pain, pt did not have any PRN pain medications ordered, will call MD for pain meds Tubes: none Safety Measures: Safety Fall Prevention Plan has been given, discussed & signed, non skid socks in place, bed alarm activated. 2A Orientation: Patient has been orientated to the room, unit & staff.   Orders have been reviewed & implemented. Will continue to monitor the patient. Call light has been placed within reach.  Georgian Co, RN

## 2016-05-28 NOTE — Progress Notes (Signed)
Pt requesting pain medication, no pain medication ordered, MD paged. Dr. Earleen Newport to put in orders for toradol. No other concerns. Will continue to monitor. Conley Simmonds, RN

## 2016-05-28 NOTE — Care Management Obs Status (Signed)
[facility] NOTIFICATION   Patient Details  Name: [name] MRN: [id number] Date of Birth: Aug 21, 1941   Medicare Observation Status Notification Given:  Yes    Ival Bible, RN 05/28/2016, 4:28 PM

## 2016-05-29 DIAGNOSIS — R55 Syncope and collapse: Secondary | ICD-10-CM | POA: Diagnosis not present

## 2016-05-29 LAB — ECHOCARDIOGRAM COMPLETE
Height: 67 in
Weight: 2572.8 oz

## 2016-05-29 MED ORDER — ENSURE ENLIVE PO LIQD
237.0000 mL | Freq: Two times a day (BID) | ORAL | Status: DC
  Administered 2016-05-29 – 2016-05-30 (×2): 237 mL via ORAL

## 2016-05-29 MED ORDER — AMLODIPINE BESYLATE 10 MG PO TABS
10.0000 mg | ORAL_TABLET | Freq: Every day | ORAL | Status: DC
  Administered 2016-05-30: 10 mg via ORAL
  Filled 2016-05-29: qty 1

## 2016-05-29 MED ORDER — HYDRALAZINE HCL 20 MG/ML IJ SOLN
10.0000 mg | INTRAMUSCULAR | Status: DC | PRN
Start: 2016-05-29 — End: 2016-05-30
  Administered 2016-05-29: 10 mg via INTRAVENOUS
  Filled 2016-05-29: qty 1

## 2016-05-29 NOTE — Progress Notes (Signed)
Initial Nutrition Assessment  DOCUMENTATION CODES:   Not applicable  INTERVENTION:   Ensure Enlive po BID, each supplement provides 350 kcal and 20 grams of protein RD to continue to monitor   NUTRITION DIAGNOSIS:   Inadequate oral intake related to chronic illness as evidenced by per patient/family report.  GOAL:   Patient will meet greater than or equal to 90% of their needs  MONITOR:   PO intake, Labs, Weight trends, I & O's, Supplement acceptance  REASON FOR ASSESSMENT:   Malnutrition Screening Tool    ASSESSMENT:   Markquis Bruck  is a 75 y.o. male presents with chest pain and near syncope. Patient states that he's been having problems with his right leg going on for 5 weeks and is unable to walk very well. He just woke up from a nap on his couch and he started getting sweaty nauseous and dizzy  Spoke with Mr. Sciullo at bedside. He endorses poor appetite with R Hip pain for 3 weeks or so. He endorses a usual body weight of 172#, claims he was last that weight 4 months ago. He states he was slowly gaining weight back until eh was admitted again for hip pain. Pt was taking medication for pain but nothing seemed to help him. He also exhibits COPD exacerbation upon admission but was not on [place] during my visit. He had consumed 100% of his tray at his bedside. Nutrition-Focused physical exam completed. Findings are no fat depletion, no muscle depletion, and no edema.  Per chart review he exhibits a 4#/2.4% insignificant wt loss over 1 month. Labs and medications reviewed: CBGs 179, Na 133  Diet Order:  Diet heart healthy/carb modified Room service appropriate? Yes; Fluid consistency: Thin  Skin:  Reviewed, no issues  Last BM:  05/27/2016  Height:   Ht Readings from Last 1 Encounters:  05/27/16 5\' 7"  (1.702 m)    Weight:   Wt Readings from Last 1 Encounters:  05/27/16 160 lb 12.8 oz (72.9 kg)    Ideal Body Weight:  67.27 kg  BMI:  Body mass index is 25.18  kg/m.  Estimated Nutritional Needs:   Kcal:  1450-1800 calories  Protein:  72-87 gm  Fluid:  >/= 1.5L  EDUCATION NEEDS:   Education needs addressed  Satira Anis. Joey Hudock, MS, RD LDN Inpatient Clinical Dietitian Pager [phone]

## 2016-05-29 NOTE — Evaluation (Signed)
Physical Therapy Evaluation Patient Details Name: [name] MRN: [id number] DOB: 1941/05/04 Today's Date: 05/29/2016   History of Present Illness  75 y/o male here with COPD exacerbation and has been having some sciatic issues.   Clinical Impression  Pt was eager to work with PT and does well with prolonged ambulation (with wheeled walker)  and stair negotiation.  He has functional mobility and is able to stand w/o assist and only minimal UE use.  Pt did have some LE weakness and occasional buckling with ambulation but ultimately did well. Pt should be able to return home safely and would benefit from [place].    Follow Up Recommendations Home health PT    Equipment Recommendations  Rolling walker with 5" wheels    Recommendations for Other Services       Precautions / Restrictions Precautions Precautions: Fall Restrictions Weight Bearing Restrictions: No      Mobility  Bed Mobility Overal bed mobility: Independent             General bed mobility comments: Pt able to get up to sitting EOB w/o issue  Transfers Overall transfer level: Modified independent Equipment used: Rolling walker (2 wheeled)             General transfer comment: Pt able to rise with standing w/o issues  Ambulation/Gait   Ambulation Distance (Feet): 250 Feet Assistive device: Rolling walker (2 wheeled)       General Gait Details: Pt is able to ambulate with consistent speed and cadance (though he did have a few buckling episodes on the R).  Pt c/o only minimal fatigue.     Stairs            Wheelchair Mobility    Modified Rankin (Stroke Patients Only)       Balance Overall balance assessment: Modified Independent                                           Pertinent Vitals/Pain Pain Assessment: 0-10 Pain Score: 4  Pain Location: R sciatic/LE    Home Living Family/patient expects to be discharged to:: Private residence Living Arrangements:  Spouse/significant other Available Help at Discharge: Family   Home Access: Stairs to enter Entrance Stairs-Rails: Can reach both Entrance Stairs-Number of Steps: 5   Home Equipment: Environmental consultant - standard      Prior Function Level of Independence: Independent with assistive device(s)         Comments: Pt reports he is able to get out of the house some, has been limited recently     Hand Dominance        Extremity/Trunk Assessment   Upper Extremity Assessment: Overall WFL for tasks assessed           Lower Extremity Assessment: Overall WFL for tasks assessed (Pt has only minimal R LE limitations)         Communication   Communication: No difficulties  Cognition Arousal/Alertness: Awake/alert Behavior During Therapy: WFL for tasks assessed/performed Overall Cognitive Status: Within Functional Limits for tasks assessed                      General Comments      Exercises        Assessment/Plan    PT Assessment Patient needs continued PT services  PT Diagnosis Difficulty walking;Generalized weakness   PT Problem List Decreased strength;Decreased  activity tolerance;Decreased range of motion;Decreased balance;Decreased mobility;Decreased safety awareness;Decreased knowledge of use of DME  PT Treatment Interventions DME instruction;Gait training;Stair training;Functional mobility training;Therapeutic activities;Therapeutic exercise;Balance training;Neuromuscular re-education;Patient/family education   PT Goals (Current goals can be found in the Care Plan section) Acute Rehab PT Goals Patient Stated Goal: go home PT Goal Formulation: With patient Time For Goal Achievement: 06/12/16 Potential to Achieve Goals: Fair    Frequency Min 2X/week   Barriers to discharge        Co-evaluation               End of Session Equipment Utilized During Treatment: Gait belt Activity Tolerance: Patient tolerated treatment well Patient left: with chair alarm  set;with call bell/phone within reach      Functional Assessment Tool Used: clinical judgement Functional Limitation: Mobility: Walking and moving around Mobility: Walking and Moving Around Current Status [phone]): At least 20 percent but less than 40 percent impaired, limited or restricted Mobility: Walking and Moving Around Goal Status [phone]): At least 1 percent but less than 20 percent impaired, limited or restricted    Time: 1340-1355 PT Time Calculation (min) (ACUTE ONLY): 15 min   Charges:   PT Evaluation $PT Eval Low Complexity: 1 Procedure     PT G Codes:   PT G-Codes **NOT FOR INPATIENT CLASS** Functional Assessment Tool Used: clinical judgement Functional Limitation: Mobility: Walking and moving around Mobility: Walking and Moving Around Current Status [id number]): At least 20 percent but less than 40 percent impaired, limited or restricted Mobility: Walking and Moving Around Goal Status [phone]): At least 1 percent but less than 20 percent impaired, limited or restricted    Kreg Shropshire, DPT 05/29/2016, 4:34 PM

## 2016-05-29 NOTE — Progress Notes (Signed)
All three orthostatic pressures were elevated, Dr. Marcille Blanco paged new order to give morning  Lisinopril 20mg  early. No SOB or acute distress noted. Will continue to monitor pt. Pt. Also given Toradol for pain. Will continue to monitor pt.

## 2016-05-29 NOTE — Progress Notes (Signed)
[facility] [place] NAME: [name]    MRN#:  [id number]  DATE OF BIRTH:  [birth year]  SUBJECTIVE:  Hospital Day: 1 day [name] is a 75 y.o. male presenting with Loss of Consciousness and Chest Pain .   Overnight events: Hypertensive Interval Events: Still complains of right-sided leg pain  REVIEW OF SYSTEMS:  CONSTITUTIONAL: No fever, fatigue or weakness.  EYES: No blurred or double vision.  EARS, NOSE, AND THROAT: No tinnitus or ear pain.  RESPIRATORY: No cough, shortness of breath, wheezing or hemoptysis.  CARDIOVASCULAR: No chest pain, orthopnea, edema.  GASTROINTESTINAL: No nausea, vomiting, diarrhea or abdominal pain.  GENITOURINARY: No dysuria, hematuria.  ENDOCRINE: No polyuria, nocturia,  HEMATOLOGY: No anemia, easy bruising or bleeding SKIN: No rash or lesion. MUSCULOSKELETAL: No joint pain or arthritis.  Positive leg pain NEUROLOGIC: No tingling, numbness, weakness.  PSYCHIATRY: No anxiety or depression.   DRUG ALLERGIES:   Allergies  Allergen Reactions  . Codeine   . Ivp Dye [Iodinated Diagnostic Agents] Hives and Other (See Comments)    dizzy  . Percocet [Oxycodone-Acetaminophen] Hives and Nausea Only    VITALS:  Blood pressure (!) 165/80, pulse (!) 103, temperature 98 F (36.7 C), temperature source Oral, resp. rate 20, height 5\' 7"  (1.702 m), weight 72.9 kg (160 lb 12.8 oz), SpO2 92 %.  PHYSICAL EXAMINATION:  VITAL SIGNS: Vitals:   05/29/16 0848 05/29/16 1204  BP: (!) 185/92 (!) 165/80  Pulse: 88 (!) 103  Resp:  20  Temp:     GENERAL:74 y.o.male currently in no acute distress.  HEAD: Normocephalic, atraumatic.  EYES: Pupils equal, round, reactive to light. Extraocular muscles intact. No scleral icterus.  MOUTH: Moist mucosal membrane. Dentition intact. No abscess noted.  EAR, NOSE, THROAT: Clear without exudates. No external lesions.  NECK: Supple. No thyromegaly. No nodules. No JVD.   PULMONARY: Clear to ascultation, without wheeze rails or rhonci. No use of accessory muscles, Good respiratory effort. good air entry bilaterally CHEST: Nontender to palpation.  CARDIOVASCULAR: S1 and S2. Regular rate and rhythm. No murmurs, rubs, or gallops. No edema. Pedal pulses 2+ bilaterally.  GASTROINTESTINAL: Soft, nontender, nondistended. No masses. Positive bowel sounds. No hepatosplenomegaly.  MUSCULOSKELETAL: No swelling, clubbing, or edema. Range of motion limited in right leg given pain  NEUROLOGIC: Cranial nerves II through XII are intact. No gross focal neurological deficits. Sensation intact. Reflexes intact.  SKIN: No ulceration, lesions, rashes, or cyanosis. Skin warm and dry. Turgor intact.  PSYCHIATRIC: Mood, affect within normal limits. The patient is awake, alert and oriented x 3. Insight, judgment intact.      LABORATORY PANEL:   CBC  Recent Labs Lab 05/28/16 0104  WBC 6.2  HGB 12.2*  HCT 36.3*  PLT 129*   ------------------------------------------------------------------------------------------------------------------  Chemistries   Recent Labs Lab 05/28/16 0104  NA 133*  K 4.2  CL 102  CO2 26  GLUCOSE 235*  BUN 18  CREATININE 0.96  CALCIUM 8.4*   ------------------------------------------------------------------------------------------------------------------  Cardiac Enzymes  Recent Labs Lab 05/28/16 0104  TROPONINI 0.05*   ------------------------------------------------------------------------------------------------------------------  RADIOLOGY:  Dg Chest 2 View  Result Date: 05/27/2016 CLINICAL DATA:  Syncope and chest pain today. EXAM: CHEST  2 VIEW COMPARISON:  CT chest 06/03/2011.  PA and lateral chest 11/28/2015. FINDINGS: The lungs are emphysematous but clear. Heart size is normal. Aortic atherosclerosis is seen. 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 No focal bony abnormality. IMPRESSION: No acute disease. Emphysema.  Atherosclerosis. 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 Electronically Signed   By: Inge Rise M.D.   On: 05/27/2016 16:45   Nm Pulmonary Perf And Vent  Result Date: 05/27/2016 CLINICAL DATA:  Acute onset of generalized chest pain. Initial encounter. EXAM: NUCLEAR MEDICINE VENTILATION - PERFUSION LUNG SCAN TECHNIQUE: Ventilation images were obtained in multiple projections using inhaled aerosol Tc-58m DTPA. Perfusion images were obtained in multiple projections after intravenous injection of Tc-40m MAA. RADIOPHARMACEUTICALS:  31.36 mCi Technetium-15m DTPA aerosol inhalation and 4.27 mCi Technetium-15m MAA IV COMPARISON:  Chest radiograph performed earlier today at 4:28 p.m. FINDINGS: Ventilation: Multiple areas of decreased ventilation are seen within both lungs, possibly reflecting underlying emphysematous change. Perfusion: No significant wedge-shaped peripheral perfusion defects are seen to suggest acute pulmonary embolus. Vague areas of decreased perfusion within both lungs appear to correspond to ventilation defects, and are less prominent than on ventilation images. IMPRESSION: Low probability for pulmonary embolus. Multiple areas of decreased ventilation within both lungs may reflect the patient's underlying emphysematous change. Electronically Signed   By: Garald Balding M.D.   On: 05/27/2016 23:09    EKG:   Orders placed or performed during the hospital encounter of 05/27/16  . ED EKG  . ED EKG    ASSESSMENT AND PLAN:   [name] is a 75 y.o. male presenting with Loss of Consciousness and Chest Pain . Admitted 05/27/2016 : Day #: 1 day  1. Near syncope: No acute telemetry issues 2. Sciatica: Continue steroids physical therapy pain medication, muscle relaxers 3. Hyperlipidemia unspecified: Statin therapy 4. Essential hypertension:Uncontrolled, add IV hydralazine, increase amlodipine  5. GERD without esophagitis: PPI therapy 6. COPD no evidence of COPD exacerbation at this  time  Disposition: Physical therapy, anticipate discharge tomorrow with home health  All the records are reviewed and case discussed with Care Management/Social Workerr. Management plans discussed with the patient, family and they are in agreement.  CODE STATUS: full TOTAL TIME TAKING CARE OF THIS PATIENT: 33 minutes.   POSSIBLE D/C IN 1DAYS, DEPENDING ON CLINICAL CONDITION.   Hower,  Karenann Cai.D on 05/29/2016 at 12:13 PM  Between 7am to 6pm - Pager - [phone]  After 6pm: House Pager: - [phone]  [facility]  Office  [phone]  CC: Primary care physician; Melodye Ped, MD

## 2016-05-29 NOTE — Progress Notes (Signed)
Had 7 beat run of vtach -  Patient has no complaints, sitting up eating. Notified Dr. Lavetta Nielsen. No new orders. Lauris Poag 05/29/18 9:50

## 2016-05-30 DIAGNOSIS — R55 Syncope and collapse: Secondary | ICD-10-CM | POA: Diagnosis not present

## 2016-05-30 MED ORDER — AMLODIPINE BESYLATE 10 MG PO TABS: 10.0000 mg | ORAL_TABLET | Freq: Every day | ORAL | 0 refills | Status: DC

## 2016-05-30 MED ORDER — KETOROLAC TROMETHAMINE 10 MG PO TABS: 10.0000 mg | ORAL_TABLET | Freq: Four times a day (QID) | ORAL | 0 refills | Status: DC | PRN

## 2016-05-30 MED ORDER — ENSURE ENLIVE PO LIQD: 237.0000 mL | Freq: Two times a day (BID) | ORAL | 12 refills | Status: DC

## 2016-05-30 MED ORDER — PREDNISONE 10 MG (21) PO TBPK
ORAL_TABLET | ORAL | 0 refills | Status: DC
Start: 2016-05-30 — End: 2019-08-29

## 2016-05-30 MED ORDER — CYCLOBENZAPRINE HCL 5 MG PO TABS: 5.0000 mg | ORAL_TABLET | Freq: Three times a day (TID) | ORAL | 0 refills | Status: DC

## 2016-05-30 NOTE — Care Management (Signed)
Patient for discharge home today with home health physical therapy, nursing and walker.  [facility] preference is [facility].  Walker delivered to the room

## 2016-05-30 NOTE — Discharge Summary (Signed)
[facility] NAME: [name]    MR#:  [id number]  DATE OF BIRTH:  28-Feb-1941  DATE OF ADMISSION:  05/27/2016 ADMITTING PHYSICIAN: Loletha Grayer, MD  DATE OF DISCHARGE: 05/30/16  PRIMARY CARE PHYSICIAN: Melodye Ped, MD    ADMISSION DIAGNOSIS:   Elevated d-dimer [R79.1] Syncope, unspecified syncope type [R55]  DISCHARGE DIAGNOSIS:  Active Problems:   Near syncope Sciatica   SECONDARY DIAGNOSIS:   Past Medical History:  Diagnosis Date  . COPD (chronic obstructive pulmonary disease) ([place])   . Coronary artery disease   . Diabetes mellitus without complication ([place])   . Hypertension   . TIA (transient ischemic attack)     HOSPITAL COURSE:  [name]  is a 75 y.o. male admitted 05/27/2016 with chief complaint Near syncope . Please see H&P performed by Loletha Grayer, MD for further information. Patient presented with the above symptoms. He is also been having difficulty walking for about a week in total duration secondary to issues with sciatica he originally stated that he crawled to the bathroom and proceeded to have a bowel movement where he felt lightheaded and laid back down the floor. Cardiology workup for syncope has been within normal limits. He has received symptomatic relief with pain medications and muscle relaxers as well as steroids for sciatica. He has been evaluated by physical therapy who recommended home health  DISCHARGE CONDITIONS:   Stable  CONSULTS OBTAINED:    DRUG ALLERGIES:   Allergies  Allergen Reactions  . Codeine   . Ivp Dye [Iodinated Diagnostic Agents] Hives and Other (See Comments)    dizzy  . Percocet [Oxycodone-Acetaminophen] Hives and Nausea Only    DISCHARGE MEDICATIONS:   Current Discharge Medication List    START taking these medications   Details  cyclobenzaprine (FLEXERIL) 5 MG tablet Take 1 tablet (5 mg total) by mouth 3 (three) times daily. Qty: 30 tablet,  Refills: 0    feeding supplement, ENSURE ENLIVE, (ENSURE ENLIVE) LIQD Take 237 mLs by mouth 2 (two) times daily between meals. Qty: 237 mL, Refills: 12    ketorolac (TORADOL) 10 MG tablet Take 1 tablet (10 mg total) by mouth every 6 (six) hours as needed. Qty: 20 tablet, Refills: 0      CONTINUE these medications which have CHANGED   Details  amLODipine (NORVASC) 10 MG tablet Take 1 tablet (10 mg total) by mouth daily. Qty: 30 tablet, Refills: 0    predniSONE (STERAPRED UNI-PAK 21 TAB) 10 MG (21) TBPK tablet 40mg  oral 1 day, then 20mg  oral for 2 days, then 10mg  oral 2 days, then stop Qty: 10 tablet, Refills: 0      CONTINUE these medications which have NOT CHANGED   Details  budesonide-formoterol (SYMBICORT) 160-4.5 MCG/ACT inhaler Inhale 2 puffs into the lungs 2 (two) times daily.    gabapentin (NEURONTIN) 100 MG capsule Take 100 mg by mouth once.    lisinopril (PRINIVIL,ZESTRIL) 20 MG tablet Take 20 mg by mouth daily.    metFORMIN (GLUCOPHAGE) 500 MG tablet Take by mouth 2 (two) times daily with a meal.    omeprazole (PRILOSEC) 20 MG capsule Take 20 mg by mouth daily.    tiotropium (SPIRIVA) 18 MCG inhalation capsule Place 1 capsule into inhaler and inhale daily.    lovastatin (MEVACOR) 40 MG tablet Take 40 mg by mouth at bedtime.    silodosin (RAPAFLO) 8 MG CAPS capsule Take 8 mg by mouth daily with breakfast.  STOP taking these medications     atorvastatin (LIPITOR) 40 MG tablet      nicotine (NICODERM CQ - DOSED IN MG/24 HOURS) 21 mg/24hr patch      azithromycin (ZITHROMAX) 250 MG tablet      chlorpheniramine-HYDROcodone (TUSSIONEX PENNKINETIC ER) 10-8 MG/5ML SUER          DISCHARGE INSTRUCTIONS:    DIET:  Regular diet  DISCHARGE CONDITION:  Stable  ACTIVITY:  Activity as tolerated  OXYGEN:  Home Oxygen: No.   Oxygen Delivery: room air  DISCHARGE LOCATION:  home   If you experience worsening of your admission symptoms, develop shortness  of breath, life threatening emergency, suicidal or homicidal thoughts you must seek medical attention immediately by calling 911 or calling your MD immediately  if symptoms less severe.  You Must read complete instructions/literature along with all the possible adverse reactions/side effects for all the Medicines you take and that have been prescribed to you. Take any new Medicines after you have completely understood and accpet all the possible adverse reactions/side effects.   Please note  You were cared for by a hospitalist during your hospital stay. If you have any questions about your discharge medications or the care you received while you were in the hospital after you are discharged, you can call the unit and asked to speak with the hospitalist on call if the hospitalist that took care of you is not available. Once you are discharged, your primary care physician will handle any further medical issues. Please note that NO REFILLS for any discharge medications will be authorized once you are discharged, as it is imperative that you return to your primary care physician (or establish a relationship with a primary care physician if you do not have one) for your aftercare needs so that they can reassess your need for medications and monitor your lab values.    On the day of Discharge:   VITAL SIGNS:  Blood pressure (!) 164/83, pulse 90, temperature 97.9 F (36.6 C), temperature source Oral, resp. rate 18, height 5\' 7"  (1.702 m), weight 72.9 kg (160 lb 12.8 oz), SpO2 94 %.  I/O:   Intake/Output Summary (Last 24 hours) at 05/30/16 1144 Last data filed at 05/30/16 1027  Gross per 24 hour  Intake              770 ml  Output             2280 ml  Net            -1510 ml    PHYSICAL EXAMINATION:  GENERAL:  75 y.o.-year-old patient lying in the bed with no acute distress.  EYES: Pupils equal, round, reactive to light and accommodation. No scleral icterus. Extraocular muscles intact.  HEENT:  Head atraumatic, normocephalic. Oropharynx and nasopharynx clear.  NECK:  Supple, no jugular venous distention. No thyroid enlargement, no tenderness.  LUNGS: Normal breath sounds bilaterally, no wheezing, rales,rhonchi or crepitation. No use of accessory muscles of respiration.  CARDIOVASCULAR: S1, S2 normal. No murmurs, rubs, or gallops.  ABDOMEN: Soft, non-tender, non-distended. Bowel sounds present. No organomegaly or mass.  EXTREMITIES: No pedal edema, cyanosis, or clubbing.  NEUROLOGIC: Cranial nerves II through XII are intact. Muscle strength 5/5 in all extremities. Sensation intact. Gait not checked.  PSYCHIATRIC: The patient is alert and oriented x 3.  SKIN: No obvious rash, lesion, or ulcer.   DATA REVIEW:   CBC  Recent Labs Lab 05/28/16 0104  WBC 6.2  HGB  12.2*  HCT 36.3*  PLT 129*    Chemistries   Recent Labs Lab 05/28/16 0104  NA 133*  K 4.2  CL 102  CO2 26  GLUCOSE 235*  BUN 18  CREATININE 0.96  CALCIUM 8.4*    Cardiac Enzymes  Recent Labs Lab 05/28/16 0104  TROPONINI 0.05*    Microbiology Results  Results for orders placed or performed during the hospital encounter of 11/28/15  Rapid Influenza A&B Antigens ([place] only)     Status: None   Collection Time: 11/28/15  2:05 PM  Result Value Ref Range Status   Influenza A ([facility]) NEGATIVE NEGATIVE Final   Influenza B ([facility]) NEGATIVE NEGATIVE Final    RADIOLOGY:  No results found.   Management plans discussed with the patient, family and they are in agreement.  CODE STATUS:     Code Status Orders        Start     Ordered   05/27/16 1926  Full code  Continuous     05/27/16 1926    Code Status History    Date Active Date Inactive Code Status Order ID Comments User Context   05/27/2016  7:26 PM 05/28/2016  7:16 AM Full Code [id number]  Loletha Grayer, MD ED      TOTAL TIME TAKING CARE OF THIS PATIENT: 33 minutes.    Hower,  Karenann Cai.D on 05/30/2016 at 11:44 AM  Between 7am to 6pm -  Pager - [phone]  After 6pm go to [email or web address] - Proofreader  [facility]  Office  [phone]  CC: Primary care physician; Melodye Ped, MD

## 2016-05-30 NOTE — Care Management Important Message (Signed)
Important Message  Patient Details  Name: Kaif Bagshaw MRN: [id number] Date of Birth: 1941/08/06   Medicare Important Message Given:       Katrina Stack, RN 05/30/2016, 2:43 PM

## 2016-05-30 NOTE — Progress Notes (Signed)
Discharge instructions explained to pt/ verbalized an understanding/ iv and tele removed/ walker delivered to room / will transport off unit via wheelchair when ride arrives.

## 2016-05-30 NOTE — Progress Notes (Signed)
Patient slept throughout the night.No SOB or acute distress noted. Patient complained of pain to the right hip area, PRN medication was given and effective.  Quyen Cutsforth, RN

## 2016-06-01 NOTE — Progress Notes (Signed)
[facility]  Patient Status: Closed, patient open to [facility] [place] made aware.      [name] 06/01/2016, 9:40 AM

## 2016-06-09 DIAGNOSIS — M5136 Other intervertebral disc degeneration, lumbar region: Secondary | ICD-10-CM | POA: Insufficient documentation

## 2016-06-09 DIAGNOSIS — M51369 Other intervertebral disc degeneration, lumbar region without mention of lumbar back pain or lower extremity pain: Secondary | ICD-10-CM | POA: Insufficient documentation

## 2017-06-27 DIAGNOSIS — N183 Chronic kidney disease, stage 3 (moderate): Secondary | ICD-10-CM | POA: Diagnosis not present

## 2017-06-27 DIAGNOSIS — J449 Chronic obstructive pulmonary disease, unspecified: Secondary | ICD-10-CM | POA: Diagnosis not present

## 2017-06-27 DIAGNOSIS — I10 Essential (primary) hypertension: Secondary | ICD-10-CM | POA: Diagnosis not present

## 2017-06-27 DIAGNOSIS — E1122 Type 2 diabetes mellitus with diabetic chronic kidney disease: Secondary | ICD-10-CM | POA: Diagnosis not present

## 2017-06-27 DIAGNOSIS — Z122 Encounter for screening for malignant neoplasm of respiratory organs: Secondary | ICD-10-CM | POA: Diagnosis not present

## 2017-06-27 DIAGNOSIS — E1165 Type 2 diabetes mellitus with hyperglycemia: Secondary | ICD-10-CM | POA: Diagnosis not present

## 2017-06-27 DIAGNOSIS — E0590 Thyrotoxicosis, unspecified without thyrotoxic crisis or storm: Secondary | ICD-10-CM | POA: Diagnosis not present

## 2017-06-28 DIAGNOSIS — H02402 Unspecified ptosis of left eyelid: Secondary | ICD-10-CM | POA: Insufficient documentation

## 2017-07-27 ENCOUNTER — Emergency Department: Payer: Medicare Other

## 2017-07-27 ENCOUNTER — Encounter: Payer: Self-pay | Admitting: *Deleted

## 2017-07-27 ENCOUNTER — Emergency Department
Admission: EM | Admit: 2017-07-27 | Discharge: 2017-07-27 | Disposition: A | Discharge status: Home / Self Care | Payer: Medicare Other | Attending: Emergency Medicine | Admitting: Emergency Medicine

## 2017-07-27 ENCOUNTER — Other Ambulatory Visit: Payer: Self-pay

## 2017-07-27 DIAGNOSIS — Z79899 Other long term (current) drug therapy: Secondary | ICD-10-CM | POA: Diagnosis not present

## 2017-07-27 DIAGNOSIS — E119 Type 2 diabetes mellitus without complications: Secondary | ICD-10-CM | POA: Diagnosis not present

## 2017-07-27 DIAGNOSIS — R55 Syncope and collapse: Secondary | ICD-10-CM

## 2017-07-27 DIAGNOSIS — I2510 Atherosclerotic heart disease of native coronary artery without angina pectoris: Secondary | ICD-10-CM | POA: Insufficient documentation

## 2017-07-27 DIAGNOSIS — I10 Essential (primary) hypertension: Secondary | ICD-10-CM | POA: Diagnosis not present

## 2017-07-27 DIAGNOSIS — F17200 Nicotine dependence, unspecified, uncomplicated: Secondary | ICD-10-CM | POA: Insufficient documentation

## 2017-07-27 DIAGNOSIS — J449 Chronic obstructive pulmonary disease, unspecified: Secondary | ICD-10-CM | POA: Diagnosis not present

## 2017-07-27 DIAGNOSIS — Z7982 Long term (current) use of aspirin: Secondary | ICD-10-CM | POA: Diagnosis not present

## 2017-07-27 LAB — GLUCOSE, CAPILLARY: GLUCOSE-CAPILLARY: 92 mg/dL (ref 65–99)

## 2017-07-27 LAB — BASIC METABOLIC PANEL
Anion gap: 9 (ref 5–15)
BUN: 22 mg/dL — AB (ref 6–20)
CHLORIDE: 100 mmol/L — AB (ref 101–111)
CO2: 26 mmol/L (ref 22–32)
CREATININE: 1.3 mg/dL — AB (ref 0.61–1.24)
Calcium: 8.4 mg/dL — ABNORMAL LOW (ref 8.9–10.3)
GFR calc Af Amer: >60 mL/min (ref >60)
GFR calc non Af Amer: 52 mL/min — ABNORMAL LOW (ref >60)
Glucose, Bld: 112 mg/dL — ABNORMAL HIGH (ref 65–99)
Potassium: 3.8 mmol/L (ref 3.5–5.1)
SODIUM: 135 mmol/L (ref 135–145)

## 2017-07-27 LAB — URINALYSIS, COMPLETE (UACMP) WITH MICROSCOPIC
Bacteria, UA: NONE SEEN
Bilirubin Urine: NEGATIVE
Glucose, UA: NEGATIVE mg/dL
Hgb urine dipstick: NEGATIVE
Ketones, ur: NEGATIVE mg/dL
Leukocytes, UA: NEGATIVE
Nitrite: NEGATIVE
PH: 6 (ref 5.0–8.0)
Protein, ur: 30 mg/dL — AB
SPECIFIC GRAVITY, URINE: 1.016 (ref 1.005–1.030)

## 2017-07-27 LAB — TROPONIN I: Troponin I: <0.03 ng/mL (ref <0.03)

## 2017-07-27 LAB — CBC
HCT: 36.4 % — ABNORMAL LOW (ref 40.0–52.0)
Hemoglobin: 11.5 g/dL — ABNORMAL LOW (ref 13.0–18.0)
MCH: 25.3 pg — AB (ref 26.0–34.0)
MCHC: 31.7 g/dL — ABNORMAL LOW (ref 32.0–36.0)
MCV: 79.8 fL — AB (ref 80.0–100.0)
PLATELETS: 195 10*3/uL (ref 150–440)
RBC: 4.57 MIL/uL (ref 4.40–5.90)
RDW: 15.3 % — AB (ref 11.5–14.5)
WBC: 7.3 10*3/uL (ref 3.8–10.6)

## 2017-07-27 MED ORDER — SODIUM CHLORIDE 0.9 % IV BOLUS (SEPSIS)
500.0000 mL | Freq: Once | INTRAVENOUS | Status: AC
  Administered 2017-07-27: 500 mL via INTRAVENOUS

## 2017-07-27 NOTE — ED Provider Notes (Addendum)
[facility] Emergency Department Provider Note  ____________________________________________   I have reviewed the triage vital signs and the nursing notes.   HISTORY  Chief Complaint Loss of Consciousness and Hypotension    HPI [name] is a 76 y.o. male  with a history of TIAs in the past hypertension, artificial left eye, diabetes mellitus and CAD.  He has had syncopal events in the past.  He states that he was in his normal state of health when he "took a nap".  When he woke there are lots of people standing on.  He did not think he completely passed out he became sleepy he states.  He has no complaints he has no chest pain shortness breath nausea or vomiting he does not feel lightheaded, he states he would like to go home.  Feels otherwise completely in his normal state of health.  He does state that his sugar was in the high 90s, that he is symptomatically less than 130.  Past Medical History:  Diagnosis Date  . COPD (chronic obstructive pulmonary disease) ([place])   . Coronary artery disease   . Diabetes mellitus without complication ([place])   . Hypertension   . TIA (transient ischemic attack)     Patient Active Problem List   Diagnosis Date Noted  . Near syncope 05/28/2016  . COPD exacerbation ([place]) 05/27/2016    History reviewed. No pertinent surgical history.  Prior to Admission medications   Medication Sig Start Date End Date Taking? Authorizing Provider  albuterol (PROAIR HFA) 108 (90 Base) MCG/ACT inhaler Inhale 2 puffs every 6 (six) hours as needed into the lungs. 12/07/15  Yes [provider]  amLODipine (NORVASC) 10 MG tablet Take 1 tablet (10 mg total) by mouth daily. 05/30/16  Yes Hower, Aaron Mose, MD  aspirin EC 81 MG tablet Take 81 mg daily by mouth. 11/16/06  Yes [provider]  budesonide-formoterol (SYMBICORT) 160-4.5 MCG/ACT inhaler Inhale 2 puffs into the lungs 2 (two) times daily.   Yes [provider]   lisinopril (PRINIVIL,ZESTRIL) 20 MG tablet Take 20 mg by mouth daily.   Yes [provider]  lovastatin (MEVACOR) 40 MG tablet Take 40 mg by mouth at bedtime.   Yes [provider]  metFORMIN (GLUCOPHAGE) 500 MG tablet Take by mouth 2 (two) times daily with a meal.   Yes [provider]  omeprazole (PRILOSEC) 20 MG capsule Take 20 mg by mouth daily.   Yes [provider]  tiotropium (SPIRIVA) 18 MCG inhalation capsule Place 1 capsule into inhaler and inhale daily. 12/07/15  Yes [provider]  cyclobenzaprine (FLEXERIL) 5 MG tablet Take 1 tablet (5 mg total) by mouth 3 (three) times daily. 05/30/16   Hower, Aaron Mose, MD  feeding supplement, ENSURE ENLIVE, (ENSURE ENLIVE) LIQD Take 237 mLs by mouth 2 (two) times daily between meals. Patient not taking: Reported on 07/27/2017 05/30/16   Hower, Aaron Mose, MD  ketorolac (TORADOL) 10 MG tablet Take 1 tablet (10 mg total) by mouth every 6 (six) hours as needed. Patient not taking: Reported on 07/27/2017 05/30/16   Hower, Aaron Mose, MD  predniSONE (STERAPRED UNI-PAK 21 TAB) 10 MG (21) TBPK tablet 40mg  oral 1 day, then 20mg  oral for 2 days, then 10mg  oral 2 days, then stop Patient not taking: Reported on 07/27/2017 05/30/16   Hower, Aaron Mose, MD    Allergies Codeine; Ivp dye [iodinated diagnostic agents]; and Percocet [oxycodone-acetaminophen]  History reviewed. No pertinent family history.  Social  History Social History   Tobacco Use  . Smoking status: Current Every Day Smoker  . Smokeless tobacco: Never Used  Substance Use Topics  . Alcohol use: No  . Drug use: No    Review of Systems Constitutional: No fever/chills Eyes: No visual changes. ENT: No sore throat. No stiff neck no neck pain Cardiovascular: Denies chest pain. Respiratory: Denies shortness of breath. Gastrointestinal:   no vomiting.  No diarrhea.  No constipation. Genitourinary: Negative for dysuria. Musculoskeletal: Negative lower  extremity swelling Skin: Negative for rash. Neurological: Negative for severe headaches, focal weakness or numbness.   ____________________________________________   PHYSICAL EXAM:  VITAL SIGNS: ED Triage Vitals  Enc Vitals Group     BP 07/27/17 1508 (!) 168/95     Pulse Rate 07/27/17 1508 82     Resp 07/27/17 1508 18     Temp 07/27/17 1508 97.6 F (36.4 C)     Temp Source 07/27/17 1508 Oral     SpO2 07/27/17 1508 98 %     Weight 07/27/17 1509 162 lb (73.5 kg)     Height 07/27/17 1509 5\' 7"  (1.702 m)     Head Circumference --      Peak Flow --      Pain Score --      Pain Loc --      Pain Edu? --      Excl. in [place]? --     Constitutional: Alert and oriented. Well appearing and in no acute distress. Eyes: Conjunctivae are normal left eye prosthesis noted Head: Atraumatic HEENT: No congestion/rhinnorhea. Mucous membranes are moist.  Oropharynx non-erythematous Neck:   Nontender with no meningismus, no masses, no stridor Cardiovascular: Normal rate, regular rhythm. Grossly normal heart sounds.  Good peripheral circulation. Respiratory: Normal respiratory effort.  No retractions. Lungs CTAB. Abdominal: Soft and nontender. No distention. No guarding no rebound Back:  There is no focal tenderness or step off.  there is no midline tenderness there are no lesions noted. there is no CVA tenderness Musculoskeletal: No lower extremity tenderness, no upper extremity tenderness. No joint effusions, no DVT signs strong distal pulses no edema Neurologic:  Normal speech and language. No gross focal neurologic deficits are appreciated.  Skin:  Skin is warm, dry and intact. No rash noted. Psychiatric: Mood and affect are normal. Speech and behavior are normal.  ____________________________________________   LABS (all labs ordered are listed, but only abnormal results are displayed)  Labs Reviewed  BASIC METABOLIC PANEL - Abnormal; Notable for the following components:      Result Value    Chloride 100 (*)    Glucose, Bld 112 (*)    BUN 22 (*)    Creatinine, Ser 1.30 (*)    Calcium 8.4 (*)    GFR calc non Af Amer 52 (*)    All other components within normal limits  CBC - Abnormal; Notable for the following components:   Hemoglobin 11.5 (*)    HCT 36.4 (*)    MCV 79.8 (*)    MCH 25.3 (*)    MCHC 31.7 (*)    RDW 15.3 (*)    All other components within normal limits  URINALYSIS, COMPLETE (UACMP) WITH MICROSCOPIC - Abnormal; Notable for the following components:   Color, Urine YELLOW (*)    APPearance CLEAR (*)    Protein, ur 30 (*)    Squamous Epithelial / LPF 0-5 (*)    All other components within normal limits  GLUCOSE, CAPILLARY  TROPONIN I  CBG MONITORING, ED    Pertinent labs  results that were available during my care of the patient were reviewed by me and considered in my medical decision making (see chart for details). ____________________________________________  EKG  I personally interpreted any EKGs ordered by me or triage EKG shows sinus rhythm rate 86 bpm no acute ST elevation or depression normal axis, no acute ischemia ____________________________________________  RADIOLOGY  Pertinent labs & imaging results that were available during my care of the patient were reviewed by me and considered in my medical decision making (see chart for details). If possible, patient and/or family made aware of any abnormal findings. ____________________________________________    PROCEDURES  Procedure(s) performed: None  Procedures  Critical Care performed: None  ____________________________________________   INITIAL IMPRESSION / ASSESSMENT AND PLAN / ED COURSE  Pertinent labs & imaging results that were available during my care of the patient were reviewed by me and considered in my medical decision making (see chart for details).  Patient here with no complaints stating that he fell asleep in the doctor's office while waiting for his wife to be  seen and when he woke up there was some degree of commotion.  He does not believe he passed out.  And he has no ongoing complaints.  Took his normal blood pressure medications this morning no antecedent sickness or illness.  No ongoing symptoms.  ----------------------------------------- 7:00 PM on 07/27/2017 -----------------------------------------  Remains in the department without any complaints or concerns, he would like to be discharged as soon as possible if convenient. I will check a second troponin    ____________________________________________   FINAL CLINICAL IMPRESSION(S) / ED DIAGNOSES  Final diagnoses:  None      This chart was dictated using voice recognition software.  Despite best efforts to proofread,  errors can occur which can change meaning.      Schuyler Amor, MD 07/27/17 1659    Schuyler Amor, MD 07/27/17 1901

## 2017-07-27 NOTE — ED Triage Notes (Addendum)
Pt was at Dr's office w/ wife. Pt states he went to sleep and woke up 'weak and woozy'.Pt states he said vision went black but he was not unaware of what was going on. Pt denies recent illness. Pt denies pain, endorses shortness of breath at the time but is no longer short of breath. Pt states he has had syncopal episodes before. Pt states his blood sugar has been running a little high recently. Pt states his metformin was increased recently. EMS reports pt became syncopal upon helping him to stand at MD's office.; Paramedic started PIV x 2 and administered 1 l NS.

## 2017-07-27 NOTE — Discharge Instructions (Signed)
You would prefer not to be admitted the hospital which is certainly her choice but does limit our ability to further evaluate you.  We are very reassured by your findings here however.  If you feel worse in any way including chest pain shortness of breath headache you feel like you are going to pass out or you have other concerns return to the emergency room.  Otherwise, follow closely with primary care

## 2017-08-02 DIAGNOSIS — R42 Dizziness and giddiness: Secondary | ICD-10-CM | POA: Diagnosis not present

## 2017-08-02 DIAGNOSIS — E1165 Type 2 diabetes mellitus with hyperglycemia: Secondary | ICD-10-CM | POA: Diagnosis not present

## 2017-08-02 DIAGNOSIS — Z87891 Personal history of nicotine dependence: Secondary | ICD-10-CM | POA: Diagnosis not present

## 2017-08-02 DIAGNOSIS — I10 Essential (primary) hypertension: Secondary | ICD-10-CM | POA: Diagnosis not present

## 2017-08-02 DIAGNOSIS — J449 Chronic obstructive pulmonary disease, unspecified: Secondary | ICD-10-CM | POA: Diagnosis not present

## 2017-08-25 DIAGNOSIS — E119 Type 2 diabetes mellitus without complications: Secondary | ICD-10-CM | POA: Diagnosis not present

## 2017-08-25 DIAGNOSIS — I10 Essential (primary) hypertension: Secondary | ICD-10-CM | POA: Diagnosis not present

## 2017-08-25 DIAGNOSIS — J449 Chronic obstructive pulmonary disease, unspecified: Secondary | ICD-10-CM | POA: Diagnosis not present

## 2017-08-29 DIAGNOSIS — Z970 Presence of artificial eye: Secondary | ICD-10-CM | POA: Insufficient documentation

## 2017-10-10 DIAGNOSIS — J449 Chronic obstructive pulmonary disease, unspecified: Secondary | ICD-10-CM | POA: Diagnosis not present

## 2017-10-10 DIAGNOSIS — I10 Essential (primary) hypertension: Secondary | ICD-10-CM | POA: Diagnosis not present

## 2018-01-09 DIAGNOSIS — E118 Type 2 diabetes mellitus with unspecified complications: Secondary | ICD-10-CM | POA: Diagnosis not present

## 2018-01-09 DIAGNOSIS — Z794 Long term (current) use of insulin: Secondary | ICD-10-CM | POA: Diagnosis not present

## 2018-01-09 DIAGNOSIS — J449 Chronic obstructive pulmonary disease, unspecified: Secondary | ICD-10-CM | POA: Diagnosis not present

## 2018-01-09 DIAGNOSIS — I10 Essential (primary) hypertension: Secondary | ICD-10-CM | POA: Diagnosis not present

## 2018-01-09 DIAGNOSIS — H539 Unspecified visual disturbance: Secondary | ICD-10-CM | POA: Diagnosis not present

## 2018-01-25 DIAGNOSIS — Z970 Presence of artificial eye: Secondary | ICD-10-CM | POA: Diagnosis not present

## 2018-01-25 DIAGNOSIS — Q111 Other anophthalmos: Secondary | ICD-10-CM | POA: Diagnosis not present

## 2018-01-25 DIAGNOSIS — H02402 Unspecified ptosis of left eyelid: Secondary | ICD-10-CM | POA: Diagnosis not present

## 2018-01-25 DIAGNOSIS — E119 Type 2 diabetes mellitus without complications: Secondary | ICD-10-CM | POA: Diagnosis not present

## 2018-02-06 DIAGNOSIS — H02402 Unspecified ptosis of left eyelid: Secondary | ICD-10-CM | POA: Diagnosis not present

## 2018-02-06 DIAGNOSIS — Q111 Other anophthalmos: Secondary | ICD-10-CM | POA: Diagnosis not present

## 2018-03-15 ENCOUNTER — Emergency Department: Payer: Medicare Other

## 2018-03-15 ENCOUNTER — Other Ambulatory Visit: Payer: Self-pay

## 2018-03-15 ENCOUNTER — Emergency Department
Admission: EM | Admit: 2018-03-15 | Discharge: 2018-03-15 | Disposition: A | Discharge status: Home / Self Care | Payer: Medicare Other | Attending: Emergency Medicine | Admitting: Emergency Medicine

## 2018-03-15 DIAGNOSIS — R0789 Other chest pain: Secondary | ICD-10-CM | POA: Diagnosis not present

## 2018-03-15 DIAGNOSIS — I119 Hypertensive heart disease without heart failure: Secondary | ICD-10-CM | POA: Diagnosis not present

## 2018-03-15 DIAGNOSIS — F17210 Nicotine dependence, cigarettes, uncomplicated: Secondary | ICD-10-CM | POA: Diagnosis not present

## 2018-03-15 DIAGNOSIS — Z7984 Long term (current) use of oral hypoglycemic drugs: Secondary | ICD-10-CM | POA: Diagnosis not present

## 2018-03-15 DIAGNOSIS — R42 Dizziness and giddiness: Secondary | ICD-10-CM | POA: Diagnosis not present

## 2018-03-15 DIAGNOSIS — Z7982 Long term (current) use of aspirin: Secondary | ICD-10-CM | POA: Insufficient documentation

## 2018-03-15 DIAGNOSIS — H02402 Unspecified ptosis of left eyelid: Secondary | ICD-10-CM | POA: Diagnosis not present

## 2018-03-15 DIAGNOSIS — R0602 Shortness of breath: Secondary | ICD-10-CM | POA: Diagnosis not present

## 2018-03-15 DIAGNOSIS — I2510 Atherosclerotic heart disease of native coronary artery without angina pectoris: Secondary | ICD-10-CM | POA: Insufficient documentation

## 2018-03-15 DIAGNOSIS — J449 Chronic obstructive pulmonary disease, unspecified: Secondary | ICD-10-CM | POA: Diagnosis not present

## 2018-03-15 DIAGNOSIS — E119 Type 2 diabetes mellitus without complications: Secondary | ICD-10-CM | POA: Insufficient documentation

## 2018-03-15 DIAGNOSIS — R55 Syncope and collapse: Secondary | ICD-10-CM | POA: Insufficient documentation

## 2018-03-15 LAB — CBC
HCT: 34.1 % — ABNORMAL LOW (ref 40.0–52.0)
Hemoglobin: 11.1 g/dL — ABNORMAL LOW (ref 13.0–18.0)
MCH: 25.7 pg — AB (ref 26.0–34.0)
MCHC: 32.5 g/dL (ref 32.0–36.0)
MCV: 79.1 fL — ABNORMAL LOW (ref 80.0–100.0)
PLATELETS: 155 10*3/uL (ref 150–440)
RBC: 4.31 MIL/uL — AB (ref 4.40–5.90)
RDW: 15.3 % — ABNORMAL HIGH (ref 11.5–14.5)
WBC: 6 10*3/uL (ref 3.8–10.6)

## 2018-03-15 LAB — BASIC METABOLIC PANEL
ANION GAP: 7 (ref 5–15)
BUN: 14 mg/dL (ref 8–23)
CALCIUM: 8.1 mg/dL — AB (ref 8.9–10.3)
CO2: 25 mmol/L (ref 22–32)
CREATININE: 0.94 mg/dL (ref 0.61–1.24)
Chloride: 101 mmol/L (ref 98–111)
GFR calc Af Amer: >60 mL/min (ref >60)
Glucose, Bld: 137 mg/dL — ABNORMAL HIGH (ref 70–99)
Potassium: 3.5 mmol/L (ref 3.5–5.1)
SODIUM: 133 mmol/L — AB (ref 135–145)

## 2018-03-15 LAB — TROPONIN I: Troponin I: <0.03 ng/mL (ref <0.03)

## 2018-03-15 MED ORDER — IPRATROPIUM-ALBUTEROL 0.5-2.5 (3) MG/3ML IN SOLN: 3.0000 mL | Freq: Once | RESPIRATORY_TRACT | Status: DC

## 2018-03-15 MED ORDER — SODIUM CHLORIDE 0.9 % IV BOLUS
1000.0000 mL | Freq: Once | INTRAVENOUS | Status: AC
  Administered 2018-03-15: 1000 mL via INTRAVENOUS

## 2018-03-15 NOTE — ED Notes (Signed)
Patient given sandwich tray and diet coke. Denies CP and weakness at this time. Patient is requesting to go home. MD made aware

## 2018-03-15 NOTE — ED Provider Notes (Signed)
[facility] Emergency Department Provider Note ____________________________________________   First MD Initiated Contact with Patient 03/15/18 1207     (approximate)  I have reviewed the triage vital signs and the nursing notes.   HISTORY  Chief Complaint Weakness    HPI [name] is a 77 y.o. male with PMH as noted below who presents with near syncope, acute onset this morning after he returned home from an eye surgery and after he tried to eat something, and characterizes lightheadedness and feeling like his vision was going black.  The patient states that at the same time he started to have some tightness in his chest and shortness of breath.  He states that he still feels somewhat lightheaded but the tightness is mostly resolved.  The patient states that he had surgery this morning to correct ptosis on his left eye, which was successful.  He had not eaten anything before the surgery today until he returned home.   Past Medical History:  Diagnosis Date  . COPD (chronic obstructive pulmonary disease) ([place])   . Coronary artery disease   . Diabetes mellitus without complication ([place])   . Hypertension   . TIA (transient ischemic attack)     Patient Active Problem List   Diagnosis Date Noted  . Near syncope 05/28/2016  . COPD exacerbation ([place]) 05/27/2016    No past surgical history on file.  Prior to Admission medications   Medication Sig Start Date End Date Taking? Authorizing Provider  albuterol (PROAIR HFA) 108 (90 Base) MCG/ACT inhaler Inhale 2 puffs every 6 (six) hours as needed into the lungs. 12/07/15   [provider]  amLODipine (NORVASC) 10 MG tablet Take 1 tablet (10 mg total) by mouth daily. 05/30/16   Hower, Aaron Mose, MD  aspirin EC 81 MG tablet Take 81 mg daily by mouth. 11/16/06   [provider]  budesonide-formoterol (SYMBICORT) 160-4.5 MCG/ACT inhaler Inhale 2 puffs into the lungs 2 (two) times daily.    [provider]  cyclobenzaprine (FLEXERIL) 5 MG tablet Take 1 tablet (5 mg total) by mouth 3 (three) times daily. 05/30/16   Hower, Aaron Mose, MD  feeding supplement, ENSURE ENLIVE, (ENSURE ENLIVE) LIQD Take 237 mLs by mouth 2 (two) times daily between meals. Patient not taking: Reported on 07/27/2017 05/30/16   Hower, Aaron Mose, MD  ketorolac (TORADOL) 10 MG tablet Take 1 tablet (10 mg total) by mouth every 6 (six) hours as needed. Patient not taking: Reported on 07/27/2017 05/30/16   Hower, Aaron Mose, MD  lisinopril (PRINIVIL,ZESTRIL) 20 MG tablet Take 20 mg by mouth daily.    [provider]  lovastatin (MEVACOR) 40 MG tablet Take 40 mg by mouth at bedtime.    [provider]  metFORMIN (GLUCOPHAGE) 500 MG tablet Take by mouth 2 (two) times daily with a meal.    [provider]  omeprazole (PRILOSEC) 20 MG capsule Take 20 mg by mouth daily.    [provider]  predniSONE (STERAPRED UNI-PAK 21 TAB) 10 MG (21) TBPK tablet 40mg  oral 1 day, then 20mg  oral for 2 days, then 10mg  oral 2 days, then stop Patient not taking: Reported on 07/27/2017 05/30/16   Hower, Aaron Mose, MD  tiotropium (SPIRIVA) 18 MCG inhalation capsule Place 1 capsule into inhaler and inhale daily. 12/07/15   [provider]    Allergies Codeine; Ivp dye [iodinated diagnostic agents]; and Percocet [oxycodone-acetaminophen]  No family history on file.  Social History Social History  Tobacco Use  . Smoking status: Current Every Day Smoker  . Smokeless tobacco: Never Used  Substance Use Topics  . Alcohol use: No  . Drug use: No    Review of Systems  Constitutional: No fever. Eyes: No visual changes. ENT: No sore throat. Cardiovascular: Positive for chest tightness. Respiratory: Positive for shortness of breath. Gastrointestinal: No vomiting.  Genitourinary: Negative for flank pain.  Musculoskeletal: Negative for back pain. Skin: Negative for rash. Neurological: Negative for  headache.   ____________________________________________   PHYSICAL EXAM:  VITAL SIGNS: ED Triage Vitals  Enc Vitals Group     BP 03/15/18 1200 133/73     Pulse Rate 03/15/18 1159 95     Resp 03/15/18 1200 14     Temp 03/15/18 1159 97.6 F (36.4 C)     Temp Source 03/15/18 1159 Oral     SpO2 03/15/18 1159 95 %     Weight 03/15/18 1201 162 lb (73.5 kg)     Height 03/15/18 1201 5\' 7"  (1.702 m)     Head Circumference --      Peak Flow --      Pain Score 03/15/18 1200 2     Pain Loc --      Pain Edu? --      Excl. in [place]? --     Constitutional: Alert and oriented.  Relatively well appearing and in no acute distress. Eyes: Conjunctivae are normal.  EOMI.  PERRLA.  Left eyelid appears intact with no bleeding or significant swelling. Head: Atraumatic. Nose: No congestion/rhinnorhea. Mouth/Throat: Mucous membranes are somewhat dry.   Neck: Normal range of motion.  Cardiovascular: Normal rate, regular rhythm. Grossly normal heart sounds.  Good peripheral circulation. Respiratory: Normal respiratory effort.  No retractions.  Slightly decreased breath sounds bilaterally. Gastrointestinal: Soft and nontender. No distention.  Genitourinary: No flank tenderness. Musculoskeletal: Extremities warm and well perfused.  Neurologic:  Normal speech and language.  Motor intact in all extremities.  Normal coordination.  No gross focal neurologic deficits are appreciated.  Skin:  Skin is warm and dry. No rash noted. Psychiatric: Mood and affect are normal. Speech and behavior are normal.  ____________________________________________   LABS (all labs ordered are listed, but only abnormal results are displayed)  Labs Reviewed  BASIC METABOLIC PANEL - Abnormal; Notable for the following components:      Result Value   Sodium 133 (*)    Glucose, Bld 137 (*)    Calcium 8.1 (*)    All other components within normal limits  CBC - Abnormal; Notable for the following components:   RBC 4.31 (*)     Hemoglobin 11.1 (*)    HCT 34.1 (*)    MCV 79.1 (*)    MCH 25.7 (*)    RDW 15.3 (*)    All other components within normal limits  TROPONIN I  TROPONIN I   ____________________________________________  EKG  ED ECG REPORT I, Arta Silence, the attending physician, personally viewed and interpreted this ECG.  Date: 03/15/2018 EKG Time: 1156 Rate: 81 Rhythm: normal sinus rhythm with PVCs QRS Axis: normal Intervals: normal ST/T Wave abnormalities: normal Narrative Interpretation: no evidence of acute ischemia  ____________________________________________  RADIOLOGY  CXR: No focal infiltrate or other acute findings  ____________________________________________   PROCEDURES  Procedure(s) performed: No  Procedures  Critical Care performed: No ____________________________________________   INITIAL IMPRESSION / ASSESSMENT AND PLAN / ED COURSE  Pertinent labs & imaging results that were available during my care of the patient  were reviewed by me and considered in my medical decision making (see chart for details).  77 year old male with PMH as noted above presents with near syncope at home after returning from eyelid surgery earlier today.  I reviewed the past medical records in Epic; I confirm that the patient had surgery today to correct ptosis, without complications.  The patient was also treated and released in the ED in November of last year after an episode of hypotension.  On exam, the patient is relatively well-appearing, vital signs are normal, neuro exam is nonfocal, and his mucous membranes appear slightly dry.  Overall I suspect most likely dehydration/vasovagal syncope especially given the fact that the patient was n.p.o. until the surgery and had a prodrome of lightheadedness.  Also consider anemia, electrolyte abnormality, or less likely cardiac etiology.  Plan: IV fluids, basic and cardiac labs, chest x-ray, and  reassess.  ----------------------------------------- 3:46 PM on 03/15/2018 -----------------------------------------  The patient is feeling much better after fluids, and has been asking to go home.  His lab work-up including troponin x2 has been negative.  Chest x-ray is unremarkable.  The patient appears well and his vital signs are stable.  He is stable for discharge at this time.  Return precautions given, and he expresses understanding.  ____________________________________________   FINAL CLINICAL IMPRESSION(S) / ED DIAGNOSES  Final diagnoses:  Near syncope  Atypical chest pain      NEW MEDICATIONS STARTED DURING THIS VISIT:  New Prescriptions   No medications on file     Note:  This document was prepared using Dragon voice recognition software and may include unintentional dictation errors.    Arta Silence, MD 03/15/18 1547

## 2018-03-15 NOTE — Discharge Instructions (Signed)
Make sure to stay well-hydrated and eat throughout the day.  Follow-up with your regular doctor as well as with the eye doctor as scheduled.  Return to the ER for new, worsening, persistent lightheadedness, feeling like you are going to pass out, chest pain, vomiting, fever, or any other new or worsening symptoms that concern you.

## 2018-03-15 NOTE — ED Notes (Signed)
Page Spiro [phone] Niece of patient said to call her if patient is getting d/c and she will pick patient up

## 2018-03-15 NOTE — ED Triage Notes (Signed)
Pt arrives from home via [facility]. Pt had surgery this morning on eye, does not elaborate on procedure. EMS reports they were called to scene for diabetic emergency, but pts blood sugar was 139. Pt reported feeling dizzy upon standing and was given 572ml NS pta. Protocol initiated

## 2018-04-05 DIAGNOSIS — E1122 Type 2 diabetes mellitus with diabetic chronic kidney disease: Secondary | ICD-10-CM | POA: Diagnosis not present

## 2018-04-05 DIAGNOSIS — R220 Localized swelling, mass and lump, head: Secondary | ICD-10-CM | POA: Diagnosis not present

## 2018-04-05 DIAGNOSIS — N183 Chronic kidney disease, stage 3 (moderate): Secondary | ICD-10-CM | POA: Diagnosis not present

## 2018-04-05 DIAGNOSIS — R42 Dizziness and giddiness: Secondary | ICD-10-CM | POA: Diagnosis not present

## 2018-04-06 DIAGNOSIS — T783XXD Angioneurotic edema, subsequent encounter: Secondary | ICD-10-CM | POA: Diagnosis not present

## 2018-04-11 DIAGNOSIS — Z125 Encounter for screening for malignant neoplasm of prostate: Secondary | ICD-10-CM | POA: Diagnosis not present

## 2018-04-11 DIAGNOSIS — T783XXS Angioneurotic edema, sequela: Secondary | ICD-10-CM | POA: Diagnosis not present

## 2018-04-11 DIAGNOSIS — E0590 Thyrotoxicosis, unspecified without thyrotoxic crisis or storm: Secondary | ICD-10-CM | POA: Diagnosis not present

## 2018-04-11 DIAGNOSIS — J449 Chronic obstructive pulmonary disease, unspecified: Secondary | ICD-10-CM | POA: Diagnosis not present

## 2018-05-11 DIAGNOSIS — M5136 Other intervertebral disc degeneration, lumbar region: Secondary | ICD-10-CM | POA: Diagnosis not present

## 2018-05-11 DIAGNOSIS — B370 Candidal stomatitis: Secondary | ICD-10-CM | POA: Diagnosis not present

## 2018-05-11 DIAGNOSIS — R269 Unspecified abnormalities of gait and mobility: Secondary | ICD-10-CM | POA: Diagnosis not present

## 2018-05-11 DIAGNOSIS — M6281 Muscle weakness (generalized): Secondary | ICD-10-CM | POA: Diagnosis not present

## 2018-05-11 DIAGNOSIS — T783XXS Angioneurotic edema, sequela: Secondary | ICD-10-CM | POA: Diagnosis not present

## 2018-05-18 DIAGNOSIS — M6281 Muscle weakness (generalized): Secondary | ICD-10-CM | POA: Diagnosis not present

## 2018-05-18 DIAGNOSIS — R269 Unspecified abnormalities of gait and mobility: Secondary | ICD-10-CM | POA: Diagnosis not present

## 2018-05-18 DIAGNOSIS — M5116 Intervertebral disc disorders with radiculopathy, lumbar region: Secondary | ICD-10-CM | POA: Diagnosis not present

## 2018-05-18 DIAGNOSIS — M4726 Other spondylosis with radiculopathy, lumbar region: Secondary | ICD-10-CM | POA: Diagnosis not present

## 2018-05-18 DIAGNOSIS — M5136 Other intervertebral disc degeneration, lumbar region: Secondary | ICD-10-CM | POA: Diagnosis not present

## 2018-05-19 DIAGNOSIS — M48061 Spinal stenosis, lumbar region without neurogenic claudication: Secondary | ICD-10-CM | POA: Insufficient documentation

## 2018-06-11 DIAGNOSIS — M48061 Spinal stenosis, lumbar region without neurogenic claudication: Secondary | ICD-10-CM | POA: Diagnosis not present

## 2018-06-11 DIAGNOSIS — M5410 Radiculopathy, site unspecified: Secondary | ICD-10-CM | POA: Diagnosis not present

## 2018-06-12 ENCOUNTER — Other Ambulatory Visit: Payer: Self-pay

## 2018-06-12 NOTE — Patient Outreach (Signed)
[facility]) Care Management  06/12/2018  [name] 1940-10-08 [id number]  Medication Adherence call to [name] patient is due on  Atorvastatin 40 mg and Lisinopril / Hctz 20/12.5 mg spoke with patient he is no longer taking Lisinopril/Hctz because of side effects and on Atorvastatin he is still taking but needs refill contacted his doctor left a message waiting for doctor to call back. [name] is showing past due under [place].  [facility] Direct Dial [phone]  Fax [phone] [email or web address]

## 2018-06-21 DIAGNOSIS — M5416 Radiculopathy, lumbar region: Secondary | ICD-10-CM | POA: Diagnosis not present

## 2018-06-21 DIAGNOSIS — M48061 Spinal stenosis, lumbar region without neurogenic claudication: Secondary | ICD-10-CM | POA: Diagnosis not present

## 2018-06-27 DIAGNOSIS — J441 Chronic obstructive pulmonary disease with (acute) exacerbation: Secondary | ICD-10-CM | POA: Diagnosis not present

## 2018-06-27 DIAGNOSIS — R0602 Shortness of breath: Secondary | ICD-10-CM | POA: Diagnosis not present

## 2018-08-02 DIAGNOSIS — Z87891 Personal history of nicotine dependence: Secondary | ICD-10-CM | POA: Diagnosis not present

## 2018-08-02 DIAGNOSIS — Z122 Encounter for screening for malignant neoplasm of respiratory organs: Secondary | ICD-10-CM | POA: Diagnosis not present

## 2018-08-02 DIAGNOSIS — R918 Other nonspecific abnormal finding of lung field: Secondary | ICD-10-CM | POA: Diagnosis not present

## 2018-08-11 DIAGNOSIS — R9389 Abnormal findings on diagnostic imaging of other specified body structures: Secondary | ICD-10-CM | POA: Insufficient documentation

## 2018-08-14 DIAGNOSIS — N183 Chronic kidney disease, stage 3 (moderate): Secondary | ICD-10-CM | POA: Diagnosis not present

## 2018-08-14 DIAGNOSIS — I10 Essential (primary) hypertension: Secondary | ICD-10-CM | POA: Diagnosis not present

## 2018-08-14 DIAGNOSIS — Z87891 Personal history of nicotine dependence: Secondary | ICD-10-CM | POA: Diagnosis not present

## 2018-08-14 DIAGNOSIS — E1122 Type 2 diabetes mellitus with diabetic chronic kidney disease: Secondary | ICD-10-CM | POA: Diagnosis not present

## 2018-08-14 DIAGNOSIS — J449 Chronic obstructive pulmonary disease, unspecified: Secondary | ICD-10-CM | POA: Diagnosis not present

## 2018-08-14 DIAGNOSIS — R9389 Abnormal findings on diagnostic imaging of other specified body structures: Secondary | ICD-10-CM | POA: Diagnosis not present

## 2018-10-12 ENCOUNTER — Other Ambulatory Visit: Payer: Self-pay

## 2018-10-12 NOTE — Patient Outreach (Signed)
[facility]) Care Management  10/12/2018  Jaevin Barnick [birth year] [id number]   Medication Adherence call to Mr. Glyndon Cohea patient did not answer patient is due on Atorvastatin 40 mg  [facility] said patient last pick up was on 09/18/18 for a 90 days supply patient is not due until March/2020. Mr. Tavenner is showing past due under [place].   [facility] Direct Dial [phone]  Fax [phone] [email or web address]

## 2018-10-30 DIAGNOSIS — I10 Essential (primary) hypertension: Secondary | ICD-10-CM | POA: Diagnosis not present

## 2018-10-30 DIAGNOSIS — K219 Gastro-esophageal reflux disease without esophagitis: Secondary | ICD-10-CM | POA: Diagnosis not present

## 2018-10-30 DIAGNOSIS — J449 Chronic obstructive pulmonary disease, unspecified: Secondary | ICD-10-CM | POA: Diagnosis not present

## 2018-10-30 DIAGNOSIS — E1122 Type 2 diabetes mellitus with diabetic chronic kidney disease: Secondary | ICD-10-CM | POA: Diagnosis not present

## 2018-10-30 DIAGNOSIS — J441 Chronic obstructive pulmonary disease with (acute) exacerbation: Secondary | ICD-10-CM | POA: Diagnosis not present

## 2018-11-14 DIAGNOSIS — Z0000 Encounter for general adult medical examination without abnormal findings: Secondary | ICD-10-CM | POA: Diagnosis not present

## 2019-01-30 DIAGNOSIS — N183 Chronic kidney disease, stage 3 (moderate): Secondary | ICD-10-CM | POA: Diagnosis not present

## 2019-01-30 DIAGNOSIS — E0590 Thyrotoxicosis, unspecified without thyrotoxic crisis or storm: Secondary | ICD-10-CM | POA: Diagnosis not present

## 2019-01-30 DIAGNOSIS — M48061 Spinal stenosis, lumbar region without neurogenic claudication: Secondary | ICD-10-CM | POA: Diagnosis not present

## 2019-01-30 DIAGNOSIS — J449 Chronic obstructive pulmonary disease, unspecified: Secondary | ICD-10-CM | POA: Diagnosis not present

## 2019-01-30 DIAGNOSIS — I2510 Atherosclerotic heart disease of native coronary artery without angina pectoris: Secondary | ICD-10-CM | POA: Diagnosis not present

## 2019-01-30 DIAGNOSIS — E1122 Type 2 diabetes mellitus with diabetic chronic kidney disease: Secondary | ICD-10-CM | POA: Diagnosis not present

## 2019-01-31 DIAGNOSIS — Z6379 Other stressful life events affecting family and household: Secondary | ICD-10-CM | POA: Insufficient documentation

## 2019-02-15 DIAGNOSIS — E119 Type 2 diabetes mellitus without complications: Secondary | ICD-10-CM | POA: Diagnosis not present

## 2019-02-15 DIAGNOSIS — Z87891 Personal history of nicotine dependence: Secondary | ICD-10-CM | POA: Diagnosis not present

## 2019-02-15 DIAGNOSIS — B370 Candidal stomatitis: Secondary | ICD-10-CM | POA: Diagnosis not present

## 2019-02-15 DIAGNOSIS — I2510 Atherosclerotic heart disease of native coronary artery without angina pectoris: Secondary | ICD-10-CM | POA: Diagnosis not present

## 2019-02-15 DIAGNOSIS — R9389 Abnormal findings on diagnostic imaging of other specified body structures: Secondary | ICD-10-CM | POA: Diagnosis not present

## 2019-02-15 DIAGNOSIS — I10 Essential (primary) hypertension: Secondary | ICD-10-CM | POA: Diagnosis not present

## 2019-02-15 DIAGNOSIS — B379 Candidiasis, unspecified: Secondary | ICD-10-CM | POA: Diagnosis not present

## 2019-02-15 DIAGNOSIS — J449 Chronic obstructive pulmonary disease, unspecified: Secondary | ICD-10-CM | POA: Diagnosis not present

## 2019-02-15 DIAGNOSIS — J029 Acute pharyngitis, unspecified: Secondary | ICD-10-CM | POA: Diagnosis not present

## 2019-02-15 DIAGNOSIS — N289 Disorder of kidney and ureter, unspecified: Secondary | ICD-10-CM | POA: Diagnosis not present

## 2019-03-18 DIAGNOSIS — M25531 Pain in right wrist: Secondary | ICD-10-CM | POA: Diagnosis not present

## 2019-04-26 DIAGNOSIS — Z8673 Personal history of transient ischemic attack (TIA), and cerebral infarction without residual deficits: Secondary | ICD-10-CM | POA: Diagnosis not present

## 2019-04-26 DIAGNOSIS — Z833 Family history of diabetes mellitus: Secondary | ICD-10-CM | POA: Diagnosis not present

## 2019-04-26 DIAGNOSIS — E1122 Type 2 diabetes mellitus with diabetic chronic kidney disease: Secondary | ICD-10-CM | POA: Diagnosis not present

## 2019-04-26 DIAGNOSIS — Z823 Family history of stroke: Secondary | ICD-10-CM | POA: Diagnosis not present

## 2019-04-26 DIAGNOSIS — Z1159 Encounter for screening for other viral diseases: Secondary | ICD-10-CM | POA: Diagnosis not present

## 2019-04-26 DIAGNOSIS — R079 Chest pain, unspecified: Secondary | ICD-10-CM | POA: Diagnosis not present

## 2019-04-26 DIAGNOSIS — Z7982 Long term (current) use of aspirin: Secondary | ICD-10-CM | POA: Diagnosis not present

## 2019-04-26 DIAGNOSIS — Z885 Allergy status to narcotic agent status: Secondary | ICD-10-CM | POA: Diagnosis not present

## 2019-04-26 DIAGNOSIS — I129 Hypertensive chronic kidney disease with stage 1 through stage 4 chronic kidney disease, or unspecified chronic kidney disease: Secondary | ICD-10-CM | POA: Diagnosis not present

## 2019-04-26 DIAGNOSIS — Z888 Allergy status to other drugs, medicaments and biological substances status: Secondary | ICD-10-CM | POA: Diagnosis not present

## 2019-04-26 DIAGNOSIS — I214 Non-ST elevation (NSTEMI) myocardial infarction: Secondary | ICD-10-CM | POA: Diagnosis not present

## 2019-04-26 DIAGNOSIS — Z79899 Other long term (current) drug therapy: Secondary | ICD-10-CM | POA: Diagnosis not present

## 2019-04-26 DIAGNOSIS — N183 Chronic kidney disease, stage 3 (moderate): Secondary | ICD-10-CM | POA: Diagnosis not present

## 2019-04-26 DIAGNOSIS — J449 Chronic obstructive pulmonary disease, unspecified: Secondary | ICD-10-CM | POA: Diagnosis not present

## 2019-04-26 DIAGNOSIS — I2510 Atherosclerotic heart disease of native coronary artery without angina pectoris: Secondary | ICD-10-CM | POA: Diagnosis not present

## 2019-04-26 DIAGNOSIS — Z91041 Radiographic dye allergy status: Secondary | ICD-10-CM | POA: Diagnosis not present

## 2019-04-26 DIAGNOSIS — I493 Ventricular premature depolarization: Secondary | ICD-10-CM | POA: Diagnosis not present

## 2019-04-26 DIAGNOSIS — R0789 Other chest pain: Secondary | ICD-10-CM | POA: Diagnosis not present

## 2019-04-26 DIAGNOSIS — Z7951 Long term (current) use of inhaled steroids: Secondary | ICD-10-CM | POA: Diagnosis not present

## 2019-04-26 DIAGNOSIS — Z86010 Personal history of colonic polyps: Secondary | ICD-10-CM | POA: Diagnosis not present

## 2019-04-26 DIAGNOSIS — Z7984 Long term (current) use of oral hypoglycemic drugs: Secondary | ICD-10-CM | POA: Diagnosis not present

## 2019-04-26 DIAGNOSIS — E785 Hyperlipidemia, unspecified: Secondary | ICD-10-CM | POA: Diagnosis not present

## 2019-04-26 DIAGNOSIS — K219 Gastro-esophageal reflux disease without esophagitis: Secondary | ICD-10-CM | POA: Diagnosis not present

## 2019-04-27 DIAGNOSIS — N183 Chronic kidney disease, stage 3 (moderate): Secondary | ICD-10-CM | POA: Diagnosis not present

## 2019-04-27 DIAGNOSIS — I129 Hypertensive chronic kidney disease with stage 1 through stage 4 chronic kidney disease, or unspecified chronic kidney disease: Secondary | ICD-10-CM | POA: Diagnosis not present

## 2019-04-27 DIAGNOSIS — J449 Chronic obstructive pulmonary disease, unspecified: Secondary | ICD-10-CM | POA: Diagnosis not present

## 2019-04-27 DIAGNOSIS — I214 Non-ST elevation (NSTEMI) myocardial infarction: Secondary | ICD-10-CM | POA: Diagnosis not present

## 2019-04-27 DIAGNOSIS — R079 Chest pain, unspecified: Secondary | ICD-10-CM | POA: Diagnosis not present

## 2019-04-27 DIAGNOSIS — I2510 Atherosclerotic heart disease of native coronary artery without angina pectoris: Secondary | ICD-10-CM | POA: Diagnosis not present

## 2019-05-03 DIAGNOSIS — M48061 Spinal stenosis, lumbar region without neurogenic claudication: Secondary | ICD-10-CM | POA: Diagnosis not present

## 2019-05-03 DIAGNOSIS — E0590 Thyrotoxicosis, unspecified without thyrotoxic crisis or storm: Secondary | ICD-10-CM | POA: Diagnosis not present

## 2019-05-03 DIAGNOSIS — E1122 Type 2 diabetes mellitus with diabetic chronic kidney disease: Secondary | ICD-10-CM | POA: Diagnosis not present

## 2019-05-03 DIAGNOSIS — I10 Essential (primary) hypertension: Secondary | ICD-10-CM | POA: Diagnosis not present

## 2019-05-03 DIAGNOSIS — I493 Ventricular premature depolarization: Secondary | ICD-10-CM | POA: Insufficient documentation

## 2019-05-06 DIAGNOSIS — I10 Essential (primary) hypertension: Secondary | ICD-10-CM | POA: Diagnosis not present

## 2019-05-06 DIAGNOSIS — I700 Atherosclerosis of aorta: Secondary | ICD-10-CM | POA: Diagnosis not present

## 2019-05-06 DIAGNOSIS — E1122 Type 2 diabetes mellitus with diabetic chronic kidney disease: Secondary | ICD-10-CM | POA: Diagnosis not present

## 2019-05-06 DIAGNOSIS — N183 Chronic kidney disease, stage 3 (moderate): Secondary | ICD-10-CM | POA: Diagnosis not present

## 2019-05-06 DIAGNOSIS — Z7984 Long term (current) use of oral hypoglycemic drugs: Secondary | ICD-10-CM | POA: Diagnosis not present

## 2019-05-06 DIAGNOSIS — Z823 Family history of stroke: Secondary | ICD-10-CM | POA: Diagnosis not present

## 2019-05-06 DIAGNOSIS — R0789 Other chest pain: Secondary | ICD-10-CM | POA: Diagnosis not present

## 2019-05-06 DIAGNOSIS — R55 Syncope and collapse: Secondary | ICD-10-CM | POA: Diagnosis not present

## 2019-05-06 DIAGNOSIS — I493 Ventricular premature depolarization: Secondary | ICD-10-CM | POA: Diagnosis not present

## 2019-05-06 DIAGNOSIS — Z888 Allergy status to other drugs, medicaments and biological substances status: Secondary | ICD-10-CM | POA: Diagnosis not present

## 2019-05-06 DIAGNOSIS — E860 Dehydration: Secondary | ICD-10-CM | POA: Diagnosis not present

## 2019-05-06 DIAGNOSIS — I348 Other nonrheumatic mitral valve disorders: Secondary | ICD-10-CM | POA: Diagnosis not present

## 2019-05-06 DIAGNOSIS — E785 Hyperlipidemia, unspecified: Secondary | ICD-10-CM | POA: Diagnosis not present

## 2019-05-06 DIAGNOSIS — I129 Hypertensive chronic kidney disease with stage 1 through stage 4 chronic kidney disease, or unspecified chronic kidney disease: Secondary | ICD-10-CM | POA: Diagnosis not present

## 2019-05-06 DIAGNOSIS — Z6823 Body mass index (BMI) 23.0-23.9, adult: Secondary | ICD-10-CM | POA: Diagnosis not present

## 2019-05-06 DIAGNOSIS — Z91041 Radiographic dye allergy status: Secondary | ICD-10-CM | POA: Diagnosis not present

## 2019-05-06 DIAGNOSIS — R079 Chest pain, unspecified: Secondary | ICD-10-CM | POA: Diagnosis not present

## 2019-05-06 DIAGNOSIS — Z885 Allergy status to narcotic agent status: Secondary | ICD-10-CM | POA: Diagnosis not present

## 2019-05-16 DIAGNOSIS — H40031 Anatomical narrow angle, right eye: Secondary | ICD-10-CM | POA: Diagnosis not present

## 2019-05-16 DIAGNOSIS — E119 Type 2 diabetes mellitus without complications: Secondary | ICD-10-CM | POA: Diagnosis not present

## 2019-05-16 DIAGNOSIS — Q111 Other anophthalmos: Secondary | ICD-10-CM | POA: Diagnosis not present

## 2019-05-16 DIAGNOSIS — H2511 Age-related nuclear cataract, right eye: Secondary | ICD-10-CM | POA: Diagnosis not present

## 2019-05-23 DIAGNOSIS — Q111 Other anophthalmos: Secondary | ICD-10-CM | POA: Diagnosis not present

## 2019-05-23 DIAGNOSIS — H40033 Anatomical narrow angle, bilateral: Secondary | ICD-10-CM | POA: Diagnosis not present

## 2019-05-27 DIAGNOSIS — R079 Chest pain, unspecified: Secondary | ICD-10-CM | POA: Diagnosis not present

## 2019-05-29 DIAGNOSIS — Z23 Encounter for immunization: Secondary | ICD-10-CM | POA: Diagnosis not present

## 2019-05-30 DIAGNOSIS — I471 Supraventricular tachycardia: Secondary | ICD-10-CM | POA: Diagnosis not present

## 2019-05-30 DIAGNOSIS — I10 Essential (primary) hypertension: Secondary | ICD-10-CM | POA: Diagnosis not present

## 2019-05-30 DIAGNOSIS — R079 Chest pain, unspecified: Secondary | ICD-10-CM | POA: Diagnosis not present

## 2019-06-03 DIAGNOSIS — R079 Chest pain, unspecified: Secondary | ICD-10-CM | POA: Diagnosis not present

## 2019-06-03 DIAGNOSIS — I493 Ventricular premature depolarization: Secondary | ICD-10-CM | POA: Diagnosis not present

## 2019-06-10 DIAGNOSIS — E1122 Type 2 diabetes mellitus with diabetic chronic kidney disease: Secondary | ICD-10-CM | POA: Diagnosis not present

## 2019-06-10 DIAGNOSIS — N183 Chronic kidney disease, stage 3 (moderate): Secondary | ICD-10-CM | POA: Diagnosis not present

## 2019-06-10 DIAGNOSIS — R0602 Shortness of breath: Secondary | ICD-10-CM | POA: Diagnosis not present

## 2019-06-10 DIAGNOSIS — R0789 Other chest pain: Secondary | ICD-10-CM | POA: Diagnosis not present

## 2019-06-10 DIAGNOSIS — Z885 Allergy status to narcotic agent status: Secondary | ICD-10-CM | POA: Diagnosis not present

## 2019-06-10 DIAGNOSIS — K219 Gastro-esophageal reflux disease without esophagitis: Secondary | ICD-10-CM | POA: Diagnosis not present

## 2019-06-10 DIAGNOSIS — I252 Old myocardial infarction: Secondary | ICD-10-CM | POA: Diagnosis not present

## 2019-06-10 DIAGNOSIS — E785 Hyperlipidemia, unspecified: Secondary | ICD-10-CM | POA: Diagnosis not present

## 2019-06-10 DIAGNOSIS — I2510 Atherosclerotic heart disease of native coronary artery without angina pectoris: Secondary | ICD-10-CM | POA: Diagnosis not present

## 2019-06-10 DIAGNOSIS — Z8673 Personal history of transient ischemic attack (TIA), and cerebral infarction without residual deficits: Secondary | ICD-10-CM | POA: Diagnosis not present

## 2019-06-10 DIAGNOSIS — R079 Chest pain, unspecified: Secondary | ICD-10-CM | POA: Diagnosis not present

## 2019-06-10 DIAGNOSIS — Z7982 Long term (current) use of aspirin: Secondary | ICD-10-CM | POA: Diagnosis not present

## 2019-06-10 DIAGNOSIS — I214 Non-ST elevation (NSTEMI) myocardial infarction: Secondary | ICD-10-CM | POA: Diagnosis not present

## 2019-06-10 DIAGNOSIS — I959 Hypotension, unspecified: Secondary | ICD-10-CM | POA: Diagnosis not present

## 2019-06-10 DIAGNOSIS — I129 Hypertensive chronic kidney disease with stage 1 through stage 4 chronic kidney disease, or unspecified chronic kidney disease: Secondary | ICD-10-CM | POA: Diagnosis not present

## 2019-06-10 DIAGNOSIS — H40031 Anatomical narrow angle, right eye: Secondary | ICD-10-CM | POA: Diagnosis not present

## 2019-06-10 DIAGNOSIS — I4949 Other premature depolarization: Secondary | ICD-10-CM | POA: Diagnosis not present

## 2019-06-10 DIAGNOSIS — Z91041 Radiographic dye allergy status: Secondary | ICD-10-CM | POA: Diagnosis not present

## 2019-06-10 DIAGNOSIS — I10 Essential (primary) hypertension: Secondary | ICD-10-CM | POA: Diagnosis not present

## 2019-06-10 DIAGNOSIS — J449 Chronic obstructive pulmonary disease, unspecified: Secondary | ICD-10-CM | POA: Diagnosis not present

## 2019-06-10 DIAGNOSIS — R55 Syncope and collapse: Secondary | ICD-10-CM | POA: Diagnosis not present

## 2019-06-10 DIAGNOSIS — I471 Supraventricular tachycardia: Secondary | ICD-10-CM | POA: Diagnosis not present

## 2019-06-10 DIAGNOSIS — R42 Dizziness and giddiness: Secondary | ICD-10-CM | POA: Diagnosis not present

## 2019-06-26 DIAGNOSIS — Z91041 Radiographic dye allergy status: Secondary | ICD-10-CM | POA: Diagnosis not present

## 2019-06-26 DIAGNOSIS — Z79899 Other long term (current) drug therapy: Secondary | ICD-10-CM | POA: Diagnosis not present

## 2019-06-26 DIAGNOSIS — K219 Gastro-esophageal reflux disease without esophagitis: Secondary | ICD-10-CM | POA: Diagnosis not present

## 2019-06-26 DIAGNOSIS — I129 Hypertensive chronic kidney disease with stage 1 through stage 4 chronic kidney disease, or unspecified chronic kidney disease: Secondary | ICD-10-CM | POA: Diagnosis not present

## 2019-06-26 DIAGNOSIS — E119 Type 2 diabetes mellitus without complications: Secondary | ICD-10-CM | POA: Diagnosis not present

## 2019-06-26 DIAGNOSIS — I252 Old myocardial infarction: Secondary | ICD-10-CM | POA: Diagnosis not present

## 2019-06-26 DIAGNOSIS — E782 Mixed hyperlipidemia: Secondary | ICD-10-CM | POA: Diagnosis not present

## 2019-06-26 DIAGNOSIS — R200 Anesthesia of skin: Secondary | ICD-10-CM | POA: Diagnosis not present

## 2019-06-26 DIAGNOSIS — Z7982 Long term (current) use of aspirin: Secondary | ICD-10-CM | POA: Diagnosis not present

## 2019-06-26 DIAGNOSIS — R079 Chest pain, unspecified: Secondary | ICD-10-CM | POA: Diagnosis not present

## 2019-06-26 DIAGNOSIS — Z87891 Personal history of nicotine dependence: Secondary | ICD-10-CM | POA: Diagnosis not present

## 2019-06-26 DIAGNOSIS — I493 Ventricular premature depolarization: Secondary | ICD-10-CM | POA: Diagnosis not present

## 2019-06-26 DIAGNOSIS — M19019 Primary osteoarthritis, unspecified shoulder: Secondary | ICD-10-CM | POA: Diagnosis not present

## 2019-06-26 DIAGNOSIS — Z8673 Personal history of transient ischemic attack (TIA), and cerebral infarction without residual deficits: Secondary | ICD-10-CM | POA: Diagnosis not present

## 2019-06-26 DIAGNOSIS — I2510 Atherosclerotic heart disease of native coronary artery without angina pectoris: Secondary | ICD-10-CM | POA: Diagnosis not present

## 2019-06-26 DIAGNOSIS — N183 Chronic kidney disease, stage 3 unspecified: Secondary | ICD-10-CM | POA: Diagnosis not present

## 2019-06-26 DIAGNOSIS — Z794 Long term (current) use of insulin: Secondary | ICD-10-CM | POA: Diagnosis not present

## 2019-06-26 DIAGNOSIS — E1122 Type 2 diabetes mellitus with diabetic chronic kidney disease: Secondary | ICD-10-CM | POA: Diagnosis not present

## 2019-06-26 DIAGNOSIS — Z885 Allergy status to narcotic agent status: Secondary | ICD-10-CM | POA: Diagnosis not present

## 2019-06-26 DIAGNOSIS — R0789 Other chest pain: Secondary | ICD-10-CM | POA: Diagnosis not present

## 2019-06-26 DIAGNOSIS — J449 Chronic obstructive pulmonary disease, unspecified: Secondary | ICD-10-CM | POA: Diagnosis not present

## 2019-06-26 DIAGNOSIS — R110 Nausea: Secondary | ICD-10-CM | POA: Diagnosis not present

## 2019-06-27 DIAGNOSIS — N183 Chronic kidney disease, stage 3 unspecified: Secondary | ICD-10-CM | POA: Diagnosis not present

## 2019-06-27 DIAGNOSIS — I493 Ventricular premature depolarization: Secondary | ICD-10-CM | POA: Diagnosis not present

## 2019-06-27 DIAGNOSIS — I129 Hypertensive chronic kidney disease with stage 1 through stage 4 chronic kidney disease, or unspecified chronic kidney disease: Secondary | ICD-10-CM | POA: Diagnosis not present

## 2019-06-27 DIAGNOSIS — J449 Chronic obstructive pulmonary disease, unspecified: Secondary | ICD-10-CM | POA: Diagnosis not present

## 2019-06-27 DIAGNOSIS — R079 Chest pain, unspecified: Secondary | ICD-10-CM | POA: Diagnosis not present

## 2019-06-27 DIAGNOSIS — R0789 Other chest pain: Secondary | ICD-10-CM | POA: Diagnosis not present

## 2019-06-27 DIAGNOSIS — I2510 Atherosclerotic heart disease of native coronary artery without angina pectoris: Secondary | ICD-10-CM | POA: Diagnosis not present

## 2019-07-08 DIAGNOSIS — H40039 Anatomical narrow angle, unspecified eye: Secondary | ICD-10-CM | POA: Diagnosis not present

## 2019-07-08 DIAGNOSIS — Z09 Encounter for follow-up examination after completed treatment for conditions other than malignant neoplasm: Secondary | ICD-10-CM | POA: Diagnosis not present

## 2019-07-13 DIAGNOSIS — Z01812 Encounter for preprocedural laboratory examination: Secondary | ICD-10-CM | POA: Diagnosis not present

## 2019-07-13 IMAGING — CR DG CHEST 2V
2 series · 3 of 3 positions shown · non-contrast
Comparison: 05/27/2016.

CLINICAL DATA: Near syncope.

EXAM:
CHEST  2 VIEW

[Series 2: chest lat · 0.14mm/px · 2 of 2 slices shown]
[im 1/2]
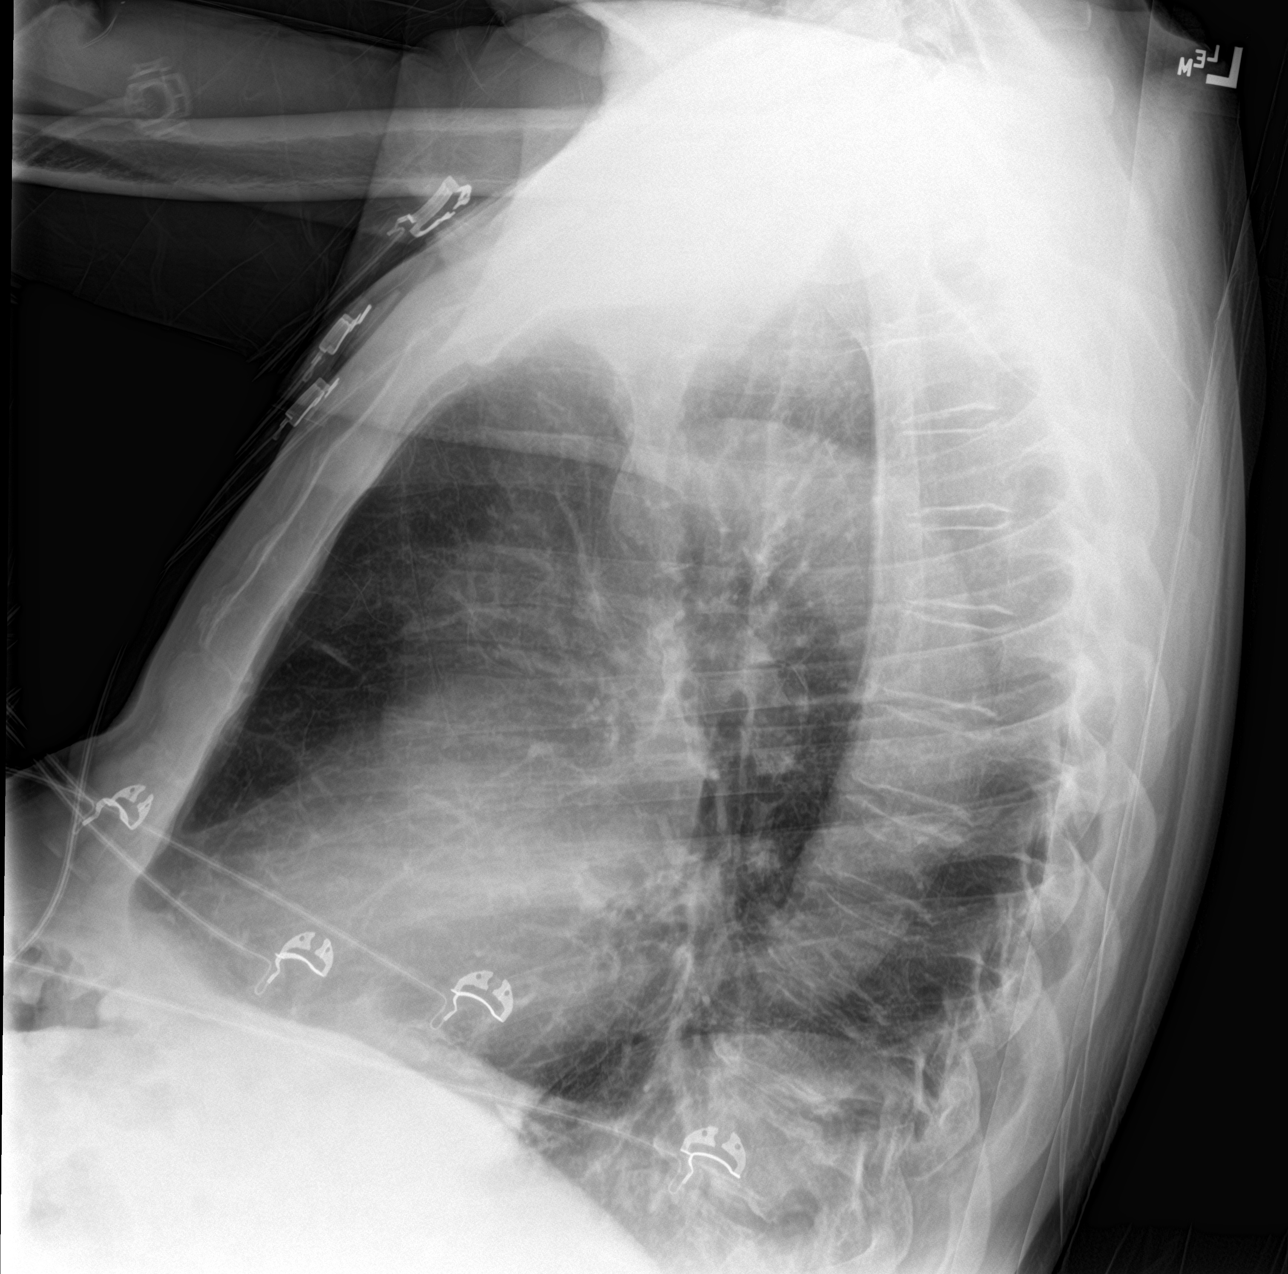
[im 2/2]
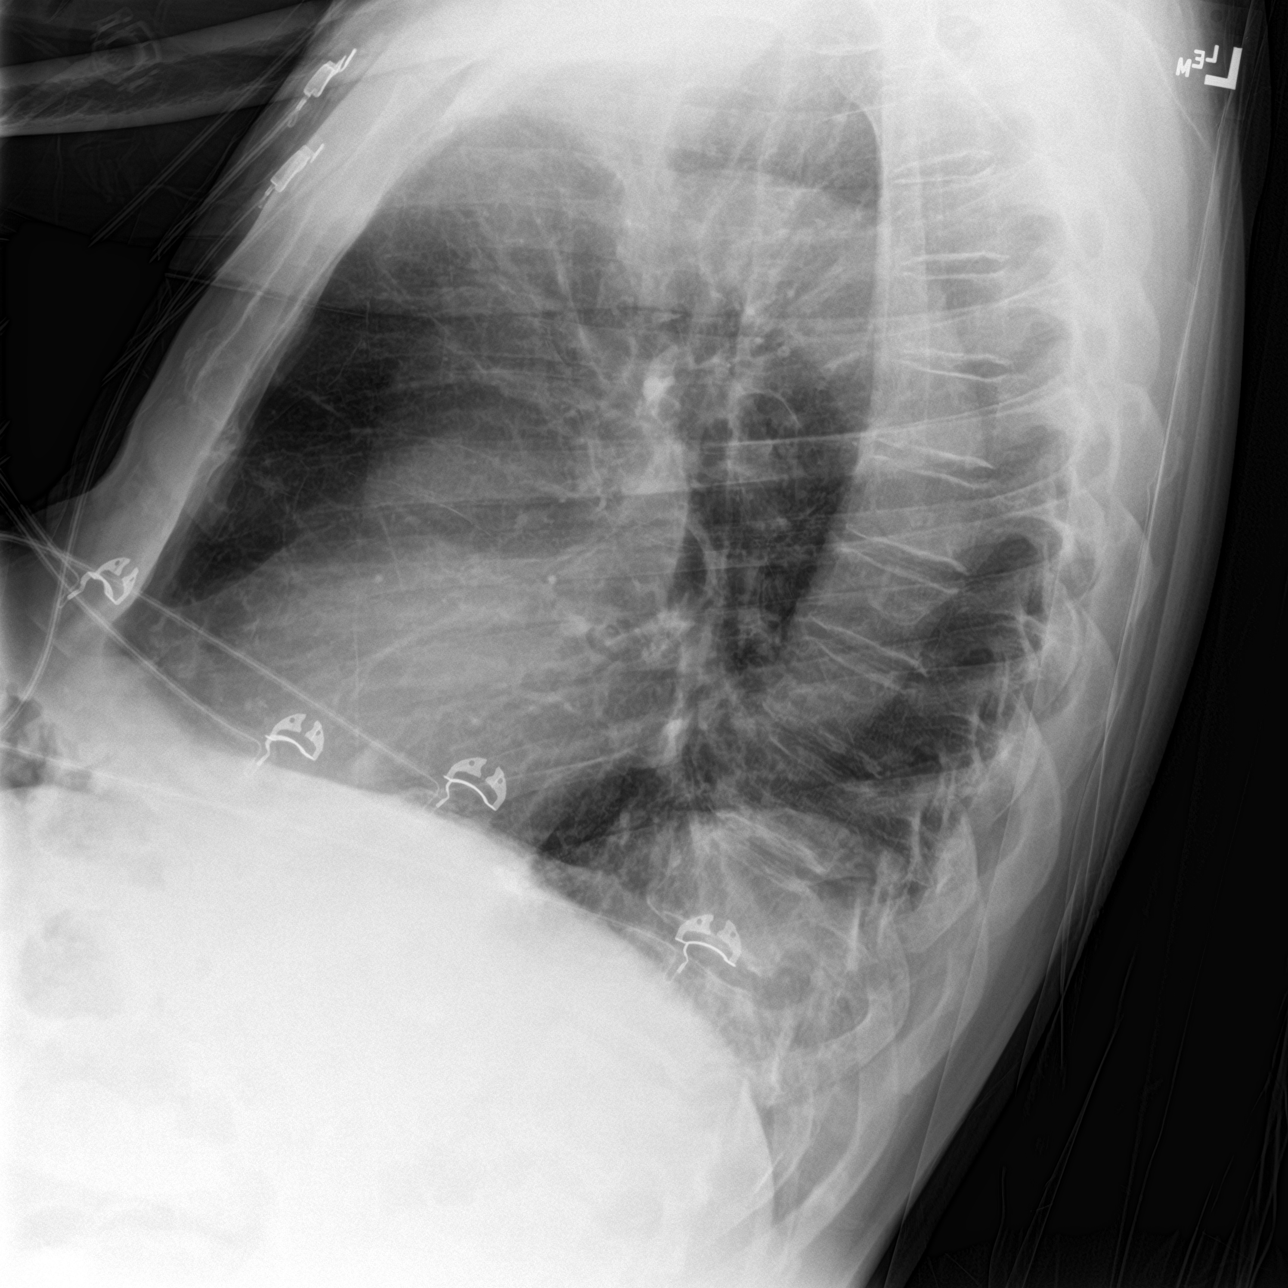

[chest ap]
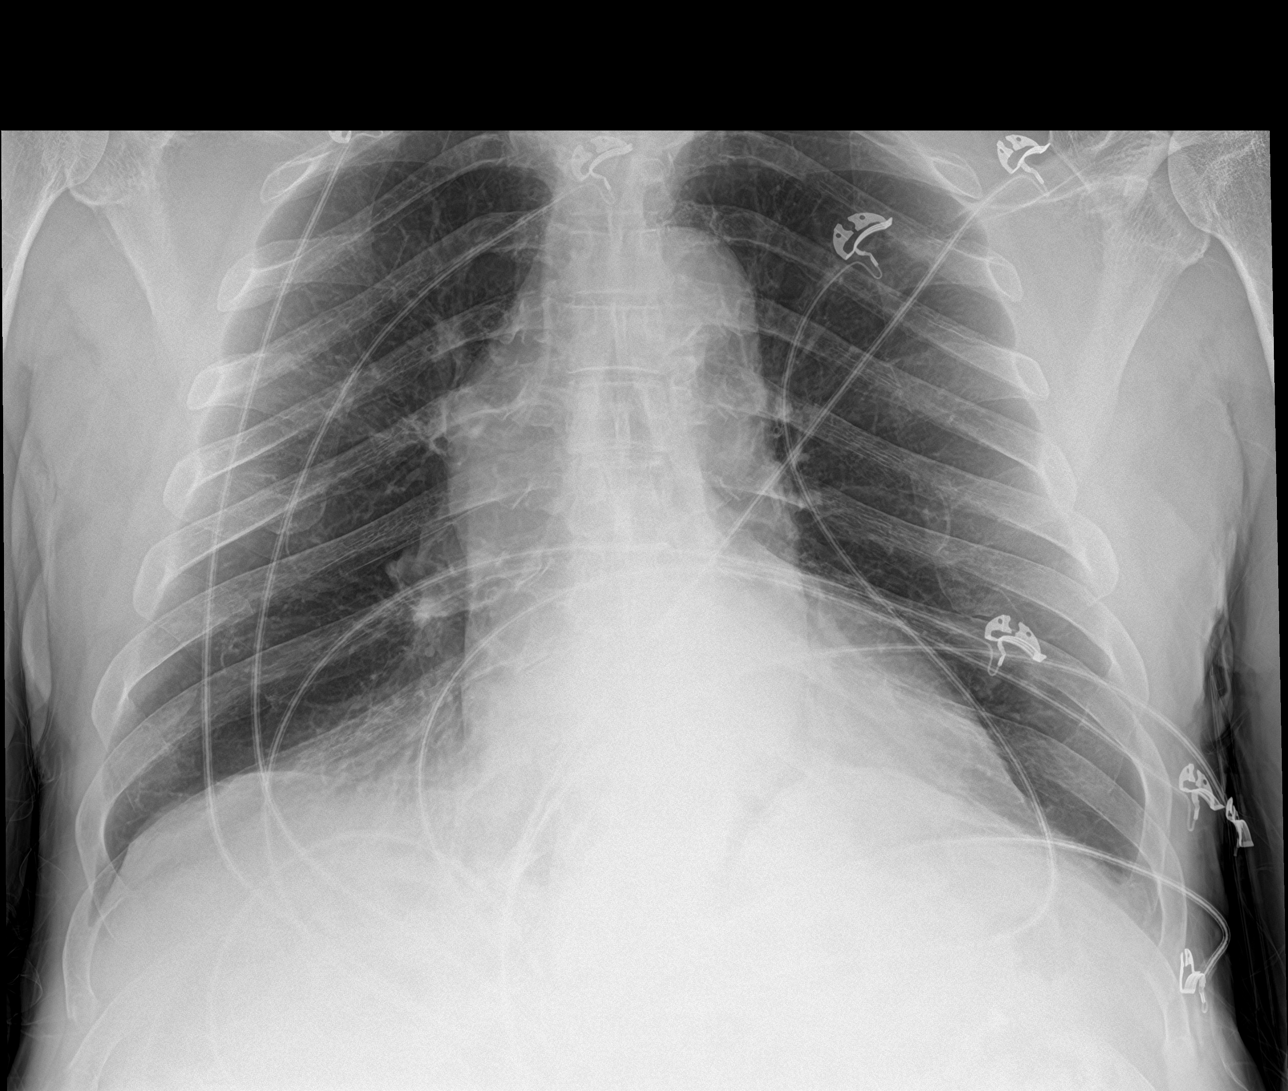

[3 of 3 positions shown; findings below may reference images not displayed]

FINDINGS: Normal sized heart. Tortuous aorta. Clear lungs with normal
vascularity. Unremarkable bones.
IMPRESSION: No acute abnormality.

## 2019-07-16 DIAGNOSIS — K317 Polyp of stomach and duodenum: Secondary | ICD-10-CM | POA: Diagnosis not present

## 2019-07-16 DIAGNOSIS — K219 Gastro-esophageal reflux disease without esophagitis: Secondary | ICD-10-CM | POA: Diagnosis not present

## 2019-07-16 DIAGNOSIS — K3189 Other diseases of stomach and duodenum: Secondary | ICD-10-CM | POA: Diagnosis not present

## 2019-07-16 DIAGNOSIS — D509 Iron deficiency anemia, unspecified: Secondary | ICD-10-CM | POA: Diagnosis not present

## 2019-07-16 DIAGNOSIS — E119 Type 2 diabetes mellitus without complications: Secondary | ICD-10-CM | POA: Diagnosis not present

## 2019-07-16 DIAGNOSIS — K2960 Other gastritis without bleeding: Secondary | ICD-10-CM | POA: Diagnosis not present

## 2019-07-16 DIAGNOSIS — K319 Disease of stomach and duodenum, unspecified: Secondary | ICD-10-CM | POA: Diagnosis not present

## 2019-07-16 DIAGNOSIS — K449 Diaphragmatic hernia without obstruction or gangrene: Secondary | ICD-10-CM | POA: Diagnosis not present

## 2019-08-06 DIAGNOSIS — Z87891 Personal history of nicotine dependence: Secondary | ICD-10-CM | POA: Diagnosis not present

## 2019-08-06 DIAGNOSIS — E1122 Type 2 diabetes mellitus with diabetic chronic kidney disease: Secondary | ICD-10-CM | POA: Diagnosis not present

## 2019-08-06 DIAGNOSIS — N183 Chronic kidney disease, stage 3 unspecified: Secondary | ICD-10-CM | POA: Diagnosis not present

## 2019-08-06 DIAGNOSIS — I10 Essential (primary) hypertension: Secondary | ICD-10-CM | POA: Diagnosis not present

## 2019-08-06 DIAGNOSIS — D500 Iron deficiency anemia secondary to blood loss (chronic): Secondary | ICD-10-CM | POA: Diagnosis not present

## 2019-08-06 DIAGNOSIS — Z122 Encounter for screening for malignant neoplasm of respiratory organs: Secondary | ICD-10-CM | POA: Diagnosis not present

## 2019-08-08 DIAGNOSIS — H2511 Age-related nuclear cataract, right eye: Secondary | ICD-10-CM | POA: Diagnosis not present

## 2019-08-08 DIAGNOSIS — H539 Unspecified visual disturbance: Secondary | ICD-10-CM | POA: Diagnosis not present

## 2019-08-08 DIAGNOSIS — E119 Type 2 diabetes mellitus without complications: Secondary | ICD-10-CM | POA: Diagnosis not present

## 2019-08-08 DIAGNOSIS — H40039 Anatomical narrow angle, unspecified eye: Secondary | ICD-10-CM | POA: Diagnosis not present

## 2019-08-21 DIAGNOSIS — Z03818 Encounter for observation for suspected exposure to other biological agents ruled out: Secondary | ICD-10-CM | POA: Diagnosis not present

## 2019-08-29 ENCOUNTER — Emergency Department: Payer: Medicare Other

## 2019-08-29 ENCOUNTER — Observation Stay: Payer: Medicare Other

## 2019-08-29 ENCOUNTER — Other Ambulatory Visit: Payer: Self-pay | Admitting: Emergency Medicine

## 2019-08-29 ENCOUNTER — Other Ambulatory Visit: Payer: Self-pay

## 2019-08-29 ENCOUNTER — Observation Stay
Admission: EM | Admit: 2019-08-29 | Discharge: 2019-08-29 | Disposition: A | Discharge status: Home / Self Care | Payer: Medicare Other | Attending: Internal Medicine | Admitting: Internal Medicine

## 2019-08-29 DIAGNOSIS — I10 Essential (primary) hypertension: Secondary | ICD-10-CM | POA: Diagnosis not present

## 2019-08-29 DIAGNOSIS — Z20828 Contact with and (suspected) exposure to other viral communicable diseases: Secondary | ICD-10-CM | POA: Diagnosis not present

## 2019-08-29 DIAGNOSIS — J449 Chronic obstructive pulmonary disease, unspecified: Secondary | ICD-10-CM | POA: Diagnosis not present

## 2019-08-29 DIAGNOSIS — R0602 Shortness of breath: Secondary | ICD-10-CM | POA: Diagnosis not present

## 2019-08-29 DIAGNOSIS — Z885 Allergy status to narcotic agent status: Secondary | ICD-10-CM | POA: Diagnosis not present

## 2019-08-29 DIAGNOSIS — F17200 Nicotine dependence, unspecified, uncomplicated: Secondary | ICD-10-CM | POA: Diagnosis not present

## 2019-08-29 DIAGNOSIS — I214 Non-ST elevation (NSTEMI) myocardial infarction: Secondary | ICD-10-CM | POA: Diagnosis not present

## 2019-08-29 DIAGNOSIS — R0789 Other chest pain: Secondary | ICD-10-CM | POA: Diagnosis not present

## 2019-08-29 DIAGNOSIS — I472 Ventricular tachycardia, unspecified: Secondary | ICD-10-CM

## 2019-08-29 DIAGNOSIS — E785 Hyperlipidemia, unspecified: Secondary | ICD-10-CM | POA: Diagnosis not present

## 2019-08-29 DIAGNOSIS — Z79899 Other long term (current) drug therapy: Secondary | ICD-10-CM | POA: Insufficient documentation

## 2019-08-29 DIAGNOSIS — Z8673 Personal history of transient ischemic attack (TIA), and cerebral infarction without residual deficits: Secondary | ICD-10-CM | POA: Insufficient documentation

## 2019-08-29 DIAGNOSIS — E119 Type 2 diabetes mellitus without complications: Secondary | ICD-10-CM | POA: Diagnosis not present

## 2019-08-29 DIAGNOSIS — I2510 Atherosclerotic heart disease of native coronary artery without angina pectoris: Secondary | ICD-10-CM | POA: Insufficient documentation

## 2019-08-29 DIAGNOSIS — Z743 Need for continuous supervision: Secondary | ICD-10-CM | POA: Diagnosis not present

## 2019-08-29 DIAGNOSIS — R0689 Other abnormalities of breathing: Secondary | ICD-10-CM | POA: Diagnosis not present

## 2019-08-29 DIAGNOSIS — Z7982 Long term (current) use of aspirin: Secondary | ICD-10-CM | POA: Diagnosis not present

## 2019-08-29 DIAGNOSIS — R778 Other specified abnormalities of plasma proteins: Secondary | ICD-10-CM | POA: Diagnosis not present

## 2019-08-29 DIAGNOSIS — R220 Localized swelling, mass and lump, head: Secondary | ICD-10-CM | POA: Diagnosis not present

## 2019-08-29 DIAGNOSIS — Z7984 Long term (current) use of oral hypoglycemic drugs: Secondary | ICD-10-CM | POA: Insufficient documentation

## 2019-08-29 DIAGNOSIS — R079 Chest pain, unspecified: Principal | ICD-10-CM | POA: Diagnosis present

## 2019-08-29 DIAGNOSIS — Z91041 Radiographic dye allergy status: Secondary | ICD-10-CM | POA: Insufficient documentation

## 2019-08-29 HISTORY — DX: Gastro-esophageal reflux disease without esophagitis: K21.9

## 2019-08-29 HISTORY — DX: Other chest pain: R07.89

## 2019-08-29 HISTORY — DX: Other nonspecific abnormal finding of lung field: R91.8

## 2019-08-29 HISTORY — DX: Atherosclerotic heart disease of native coronary artery without angina pectoris: I25.10

## 2019-08-29 HISTORY — DX: Other diseases of stomach and duodenum: K31.89

## 2019-08-29 LAB — HEPARIN LEVEL (UNFRACTIONATED): Heparin Unfractionated: 0.18 IU/mL — ABNORMAL LOW (ref 0.30–0.70)

## 2019-08-29 LAB — CBC
HCT: 34.2 % — ABNORMAL LOW (ref 39.0–52.0)
Hemoglobin: 11.1 g/dL — ABNORMAL LOW (ref 13.0–17.0)
MCH: 23.1 pg — ABNORMAL LOW (ref 26.0–34.0)
MCHC: 32.5 g/dL (ref 30.0–36.0)
MCV: 71.3 fL — ABNORMAL LOW (ref 80.0–100.0)
Platelets: 244 10*3/uL (ref 150–400)
RBC: 4.8 MIL/uL (ref 4.22–5.81)
RDW: 16.3 % — ABNORMAL HIGH (ref 11.5–15.5)
WBC: 9.6 10*3/uL (ref 4.0–10.5)
nRBC: 0 % (ref 0.0–0.2)

## 2019-08-29 LAB — COMPREHENSIVE METABOLIC PANEL
ALT: 14 U/L (ref 0–44)
AST: 25 U/L (ref 15–41)
Albumin: 3.6 g/dL (ref 3.5–5.0)
Alkaline Phosphatase: 85 U/L (ref 38–126)
Anion gap: 12 (ref 5–15)
BUN: 15 mg/dL (ref 8–23)
CO2: 26 mmol/L (ref 22–32)
Calcium: 8.2 mg/dL — ABNORMAL LOW (ref 8.9–10.3)
Chloride: 97 mmol/L — ABNORMAL LOW (ref 98–111)
Creatinine, Ser: 0.73 mg/dL (ref 0.61–1.24)
GFR calc Af Amer: >60 mL/min (ref >60)
GFR calc non Af Amer: >60 mL/min (ref >60)
Glucose, Bld: 175 mg/dL — ABNORMAL HIGH (ref 70–99)
Potassium: 3.7 mmol/L (ref 3.5–5.1)
Sodium: 135 mmol/L (ref 135–145)
Total Bilirubin: 0.8 mg/dL (ref 0.3–1.2)
Total Protein: 7.2 g/dL (ref 6.5–8.1)

## 2019-08-29 LAB — HEMOGLOBIN A1C
Hgb A1c MFr Bld: 7 % — ABNORMAL HIGH (ref 4.8–5.6)
Mean Plasma Glucose: 154.2 mg/dL

## 2019-08-29 LAB — TROPONIN I (HIGH SENSITIVITY)
Troponin I (High Sensitivity): 108 ng/L (ref <18)
Troponin I (High Sensitivity): 110 ng/L (ref <18)

## 2019-08-29 LAB — GLUCOSE, CAPILLARY: Glucose-Capillary: 340 mg/dL — ABNORMAL HIGH (ref 70–99)

## 2019-08-29 LAB — PROTIME-INR
INR: 1 (ref 0.8–1.2)
Prothrombin Time: 13.1 seconds (ref 11.4–15.2)

## 2019-08-29 LAB — APTT: aPTT: 26 seconds (ref 24–36)

## 2019-08-29 LAB — SARS CORONAVIRUS 2 (TAT 6-24 HRS): SARS Coronavirus 2: NEGATIVE

## 2019-08-29 MED ORDER — INSULIN ASPART 100 UNIT/ML ~~LOC~~ SOLN
0.0000 [IU] | Freq: Three times a day (TID) | SUBCUTANEOUS | Status: DC
  Administered 2019-08-29: 12:00:00 7 [IU] via SUBCUTANEOUS
  Filled 2019-08-29: qty 1

## 2019-08-29 MED ORDER — ACETAMINOPHEN 325 MG PO TABS: 650.0000 mg | ORAL_TABLET | ORAL | Status: DC | PRN

## 2019-08-29 MED ORDER — PANTOPRAZOLE SODIUM 40 MG PO TBEC
40.0000 mg | DELAYED_RELEASE_TABLET | Freq: Every day | ORAL | Status: DC
  Administered 2019-08-29: 40 mg via ORAL
  Filled 2019-08-29: qty 1

## 2019-08-29 MED ORDER — TIOTROPIUM BROMIDE MONOHYDRATE 18 MCG IN CAPS: 1.0000 | ORAL_CAPSULE | Freq: Every day | RESPIRATORY_TRACT | Status: DC

## 2019-08-29 MED ORDER — AMLODIPINE BESYLATE 5 MG PO TABS: 10.0000 mg | ORAL_TABLET | Freq: Every day | ORAL | Status: DC

## 2019-08-29 MED ORDER — MAGNESIUM SULFATE 2 GM/50ML IV SOLN
INTRAVENOUS | Status: AC
  Administered 2019-08-29: 03:00:00 2 g via INTRAVENOUS
  Filled 2019-08-29: qty 50

## 2019-08-29 MED ORDER — LIDOCAINE HCL (CARDIAC) PF 100 MG/5ML IV SOSY
PREFILLED_SYRINGE | INTRAVENOUS | Status: AC
  Filled 2019-08-29: qty 5

## 2019-08-29 MED ORDER — METOPROLOL SUCCINATE ER 25 MG PO TB24: 12.5000 mg | ORAL_TABLET | Freq: Every day | ORAL | Status: DC

## 2019-08-29 MED ORDER — ALBUTEROL SULFATE (2.5 MG/3ML) 0.083% IN NEBU: 2.5000 mg | INHALATION_SOLUTION | Freq: Four times a day (QID) | RESPIRATORY_TRACT | Status: DC | PRN

## 2019-08-29 MED ORDER — ONDANSETRON HCL 4 MG/2ML IJ SOLN: 4.0000 mg | Freq: Four times a day (QID) | INTRAMUSCULAR | Status: DC | PRN

## 2019-08-29 MED ORDER — LISINOPRIL 10 MG PO TABS: 20.0000 mg | ORAL_TABLET | Freq: Every day | ORAL | Status: DC

## 2019-08-29 MED ORDER — ASPIRIN EC 81 MG PO TBEC: 81.0000 mg | DELAYED_RELEASE_TABLET | Freq: Every day | ORAL | Status: DC

## 2019-08-29 MED ORDER — HEPARIN BOLUS VIA INFUSION
1900.0000 [IU] | Freq: Once | INTRAVENOUS | Status: DC
  Filled 2019-08-29: qty 1900

## 2019-08-29 MED ORDER — MAGNESIUM SULFATE 2 GM/50ML IV SOLN: 2.0000 g | Freq: Once | INTRAVENOUS | Status: AC

## 2019-08-29 MED ORDER — ALPRAZOLAM 0.5 MG PO TABS: 0.2500 mg | ORAL_TABLET | Freq: Two times a day (BID) | ORAL | Status: DC | PRN

## 2019-08-29 MED ORDER — NITROGLYCERIN 0.4 MG SL SUBL: 0.4000 mg | SUBLINGUAL_TABLET | SUBLINGUAL | Status: DC | PRN

## 2019-08-29 MED ORDER — NICOTINE 14 MG/24HR TD PT24
14.0000 mg | MEDICATED_PATCH | Freq: Every day | TRANSDERMAL | Status: DC
  Administered 2019-08-29: 14 mg via TRANSDERMAL
  Filled 2019-08-29: qty 1

## 2019-08-29 MED ORDER — HEPARIN BOLUS VIA INFUSION
3800.0000 [IU] | Freq: Once | INTRAVENOUS | Status: AC
  Administered 2019-08-29: 04:00:00 3800 [IU] via INTRAVENOUS
  Filled 2019-08-29: qty 3800

## 2019-08-29 MED ORDER — HEPARIN (PORCINE) 25000 UT/250ML-% IV SOLN
900.0000 [IU]/h | INTRAVENOUS | Status: DC
  Administered 2019-08-29: 750 [IU]/h via INTRAVENOUS
  Filled 2019-08-29: qty 250

## 2019-08-29 MED ORDER — GABAPENTIN 100 MG PO CAPS
100.0000 mg | ORAL_CAPSULE | Freq: Every day | ORAL | Status: DC
  Filled 2019-08-29: qty 1

## 2019-08-29 MED ORDER — PRAVASTATIN SODIUM 40 MG PO TABS
40.0000 mg | ORAL_TABLET | Freq: Every day | ORAL | Status: DC
  Filled 2019-08-29: qty 1

## 2019-08-29 MED ORDER — MOMETASONE FURO-FORMOTEROL FUM 200-5 MCG/ACT IN AERO: 2.0000 | INHALATION_SPRAY | Freq: Two times a day (BID) | RESPIRATORY_TRACT | Status: DC

## 2019-08-29 NOTE — Progress Notes (Signed)
CT maxillofacial reviewed.  No obvious foreign body and CT consistent with nasal endoscopy with post-surgical changes and mild chronic disease.  Recommend follow up as outpatient within next week or two for repeat nasal endoscopy.

## 2019-08-29 NOTE — ED Provider Notes (Signed)
[facility] Emergency Department Provider Note  ____________________________________________  Time seen: Approximately 3:20 AM  I have reviewed the triage vital signs and the nursing notes.   HISTORY  Chief Complaint Chest Pain   HPI [name] is a 78 y.o. male with a history of COPD, diabetes, hypertension who presents for evaluation of chest pain.  Patient reports the pain woke him up from his sleep.  He describes the pain as pressure in the center of his chest.  He was wheezing when he woke up.  He is also complaining of shortness of breath.  He was in his usual state of health when he went to bed.  No cough, no fever, no prior history of heart attacks, PE or DVT, no leg pain or swelling, recent travel immobilization, hemoptysis.  Patient reports that the discomfort in his chest is mild and improved after he received 1 DuoNeb per EMS.  Per EMS patient was satting 90% on room air and was very tight.  He received 1 DuoNeb in route .  Patient reports that he has been very sad recently due to the death of his wife a month ago.  He has been living alone since then.  He denies any suicidal or homicidal ideation.  Past Medical History:  Diagnosis Date   COPD (chronic obstructive pulmonary disease) ([place])    Coronary artery disease    Diabetes mellitus without complication ([place])    Hypertension    TIA (transient ischemic attack)     Patient Active Problem List   Diagnosis Date Noted   Near syncope 05/28/2016   COPD exacerbation ([place]) 05/27/2016    No past surgical history on file.  Prior to Admission medications   Medication Sig Start Date End Date Taking? Authorizing Provider  albuterol (PROAIR HFA) 108 (90 Base) MCG/ACT inhaler Inhale 2 puffs every 6 (six) hours as needed into the lungs. 12/07/15   [provider]  amLODipine (NORVASC) 10 MG tablet Take 1 tablet (10 mg total) by mouth daily. 05/30/16   Hower, Aaron Mose, MD  aspirin EC 81 MG  tablet Take 81 mg daily by mouth. 11/16/06   [provider]  budesonide-formoterol (SYMBICORT) 160-4.5 MCG/ACT inhaler Inhale 2 puffs into the lungs 2 (two) times daily.    [provider]  cyclobenzaprine (FLEXERIL) 5 MG tablet Take 1 tablet (5 mg total) by mouth 3 (three) times daily. 05/30/16   Hower, Aaron Mose, MD  feeding supplement, ENSURE ENLIVE, (ENSURE ENLIVE) LIQD Take 237 mLs by mouth 2 (two) times daily between meals. Patient not taking: Reported on 07/27/2017 05/30/16   Hower, Aaron Mose, MD  ketorolac (TORADOL) 10 MG tablet Take 1 tablet (10 mg total) by mouth every 6 (six) hours as needed. Patient not taking: Reported on 07/27/2017 05/30/16   Hower, Aaron Mose, MD  lisinopril (PRINIVIL,ZESTRIL) 20 MG tablet Take 20 mg by mouth daily.    [provider]  lovastatin (MEVACOR) 40 MG tablet Take 40 mg by mouth at bedtime.    [provider]  metFORMIN (GLUCOPHAGE) 500 MG tablet Take by mouth 2 (two) times daily with a meal.    [provider]  omeprazole (PRILOSEC) 20 MG capsule Take 20 mg by mouth daily.    [provider]  predniSONE (STERAPRED UNI-PAK 21 TAB) 10 MG (21) TBPK tablet 40mg  oral 1 day, then 20mg  oral for 2 days, then 10mg  oral 2 days, then stop Patient not taking: Reported on 07/27/2017 05/30/16  Hower, Aaron Mose, MD  tiotropium (SPIRIVA) 18 MCG inhalation capsule Place 1 capsule into inhaler and inhale daily. 12/07/15   [provider]    Allergies Codeine, Ivp dye [iodinated diagnostic agents], and Percocet [oxycodone-acetaminophen]  No family history on file.  Social History Social History   Tobacco Use   Smoking status: Current Every Day Smoker   Smokeless tobacco: Never Used  Substance Use Topics   Alcohol use: No   Drug use: No    Review of Systems  Constitutional: Negative for fever. Eyes: Negative for visual changes. ENT: Negative for sore throat. Neck: No neck pain  Cardiovascular: + chest  pain. Respiratory: + shortness of breath, wheezing Gastrointestinal: Negative for abdominal pain, vomiting or diarrhea. Genitourinary: Negative for dysuria. Musculoskeletal: Negative for back pain. Skin: Negative for rash. Neurological: Negative for headaches, weakness or numbness. Psych: No SI or HI. + grieving  ____________________________________________   PHYSICAL EXAM:  VITAL SIGNS: ED Triage Vitals  Enc Vitals Group     BP 08/29/19 0159 (!) 164/83     Pulse Rate 08/29/19 0159 94     Resp 08/29/19 0159 16     Temp 08/29/19 0159 98.3 F (36.8 C)     Temp Source 08/29/19 0159 Oral     SpO2 08/29/19 0159 94 %     Weight 08/29/19 0312 140 lb (63.5 kg)     Height 08/29/19 0312 5\' 7"  (1.702 m)     Head Circumference --      Peak Flow --      Pain Score 08/29/19 0156 0     Pain Loc --      Pain Edu? --      Excl. in [place]? --     Constitutional: Alert and oriented. Well appearing and in no apparent distress. HEENT:      Head: Normocephalic and atraumatic.         Eyes: Conjunctivae are normal. Sclera is non-icteric.       Mouth/Throat: Mucous membranes are moist.       Neck: Supple with no signs of meningismus. Cardiovascular: Regular rate and rhythm. No murmurs, gallops, or rubs. 2+ symmetrical distal pulses are present in all extremities. No JVD. Respiratory: Normal respiratory effort. Lungs are clear to auscultation bilaterally with good air movement and very faint wheezes.  Gastrointestinal: Soft, non tender, and non distended with positive bowel sounds. No rebound or guarding. Musculoskeletal: Nontender with normal range of motion in all extremities. No edema, cyanosis, or erythema of extremities. Neurologic: Normal speech and language. Face is symmetric. Moving all extremities. No gross focal neurologic deficits are appreciated. Skin: Skin is warm, dry and intact. No rash noted. Psychiatric: Mood and affect are normal. Speech and behavior are  normal.  ____________________________________________   LABS (all labs ordered are listed, but only abnormal results are displayed)  Labs Reviewed - No data to display ____________________________________________  EKG  ED ECG REPORT I, Rudene Re, the attending physician, personally viewed and interpreted this ECG.  1:58 AM: Normal sinus rhythm, rate of 94, borderline prolonged QTC, no ST elevations or depressions.  3:18 AM: Sinus tachycardia, rate of 102, frequent PVCs, prolonged QTC, no ST elevations or depressions. ____________________________________________  RADIOLOGY  I have personally reviewed the images performed during this visit and I agree with the Radiologist's read.   Interpretation by Radiologist:  DG Chest Port 1 View  Result Date: 08/29/2019 CLINICAL DATA:  Chest pain and shortness of breath EXAM: PORTABLE CHEST 1 VIEW COMPARISON:  03/15/2018 FINDINGS: There are hazy opacities at the left lung base. Cardiomediastinal contours are normal. No pneumothorax or sizable pleural effusion. IMPRESSION: Hazy opacities at the left lung base, possibly developing consolidation. Upright PA and lateral radiographs may be helpful for further characterization. Electronically Signed   By: Ulyses Jarred M.D.   On: 08/29/2019 03:17     ____________________________________________   PROCEDURES  Procedure(s) performed: None Procedures Critical Care performed: yes  CRITICAL CARE Performed by: Rudene Re  ?  Total critical care time: 30 min  Critical care time was exclusive of separately billable procedures and treating other patients.  Critical care was necessary to treat or prevent imminent or life-threatening deterioration.  Critical care was time spent personally by me on the following activities: development of treatment plan with patient and/or surrogate as well as nursing, discussions with consultants, evaluation of patient's response to treatment,  examination of patient, obtaining history from patient or surrogate, ordering and performing treatments and interventions, ordering and review of laboratory studies, ordering and review of radiographic studies, pulse oximetry and re-evaluation of patient's condition.  ____________________________________________   INITIAL IMPRESSION / ASSESSMENT AND PLAN / ED COURSE   78 y.o. male with a history of COPD, diabetes, hypertension who presents for evaluation of chest pressure and SOB.   Ddx NSTEMI, stress cardiomyopathy, bronchitis, COPD exacerbation, PNA, grieving.  EKG non ischemic on arrival. Lungs with faint wheezes. Given duoneb and solumedrol. SOB resolved. Given full dose ASA. Chest pressure improved but persistent. Repeat EKG with no evidence of ischemia. Troponin elevated at 102. CXR with questionable infiltrate. Patient has no cough, no fever, no white count, clinically no signs of PNA. Patient started on heparin for concerns of NSTEMI vs stress cardiomyopathy. Aortic dissection considered, however there were with no typical symptoms of chest pain radiating through to intrascapular back, no severe hypertension, symmetric bilateral radial pulses, no associated neurologic deficits, no associated abdominal or lower extremity symptoms, no marfanoid features or evidence of underlying connective tissue disorder no evidence of mediastinal widening on XR.    Clinical Course as of Aug 28 600  Thu Aug 29, 2019  0330 Patient with runs of unsustained Vtach with stable BP and mental status. Borderline prolonged QTc. IV mag given. Will continue to monitor. May need amiodarone if recurrent.    [CV]    Clinical Course User Index [CV] Alfred Levins, [place], MD   _________________________ 6:01 AM on 08/29/2019 ----------------------------------------- Patient with several PVCs but no longer having runs of [place]. While being swab for COVID, the cotton tip broke inside patient's L nare. We we will try to  have the patient blow up his nose several times however unable to remove it.  Then I tried with a nasal scope to remove it but I was not even able to visualize it.  No difficulty breathing.  I informed patient's admitting physician Dr. Damita Dunnings for an ENT consultation for removal.  She will put an order for ENT.  Patient is awaiting a bed for admission    As part of my medical decision making, I reviewed the following data within the [place] notes reviewed and incorporated, Labs reviewed , EKG interpreted , Old EKG reviewed, Old chart reviewed, Radiograph reviewed , Discussed with admitting physician , Notes from prior ED visits and [place] Controlled Substance Database   Please note:  Patient was evaluated in Emergency Department today for the symptoms described in the history of present illness. Patient was evaluated in the context of the  global COVID-19 pandemic, which necessitated consideration that the patient might be at risk for infection with the SARS-CoV-2 virus that causes COVID-19. Institutional protocols and algorithms that pertain to the evaluation of patients at risk for COVID-19 are in a state of rapid change based on information released by regulatory bodies including the CDC and federal and state organizations. These policies and algorithms were followed during the patient's care in the ED.  Some ED evaluations and interventions may be delayed as a result of limited staffing during the pandemic.   ____________________________________________   FINAL CLINICAL IMPRESSION(S) / ED DIAGNOSES   Final diagnoses:  Chest pain, unspecified type  V-tach [facility])  NSTEMI (non-ST elevated myocardial infarction) ([place])      NEW MEDICATIONS STARTED DURING THIS VISIT:  ED Discharge Orders    None       Note:  This document was prepared using Dragon voice recognition software and may include unintentional dictation errors.    Rudene Re, MD 08/29/19  [phone]

## 2019-08-29 NOTE — Consult Note (Signed)
Cardiology Consult    Patient ID: [name] MRN: [id number], DOB/AGE: [birth year]   Admit date: 08/29/2019 Date of Consult: 08/29/2019  Primary Physician: Joanie Coddington, MD Primary Cardiologist: C. Marlou Sa, Ida Requesting Provider: C. Cruzita Lederer, MD  Patient Profile    [name] is a 78 y.o. male with a history of atypical c/p, HTN, tob abuse, COPD, CKD III, DM II, HTN, HL, TIA, carotid dzs, cor Ca2+, and GERD, who is being seen today for the evaluation of chest pain and elevated HsTrop at the request of Dr. Cruzita Lederer.  Past Medical History   Past Medical History:  Diagnosis Date   Atypical chest pain    a. 05/2011 Neg MV; b. 04/2013 Neg MV; c. 04/2019 Echo: EF 55-60%, mild BAE. Degen MV dzs. Nl RV fxn; d. 06/2019 MV: EF 55%, small, mild, fixed apical and apical ant defect - probable artifact. No ischemia.   COPD (chronic obstructive pulmonary disease) (HCC)    Coronary artery calcification seen on CT scan    a. 07/2018 CT Chest - cor Ca2+.   Diabetes mellitus without complication (HCC)    Erosive gastropathy    a. 06/2019 EGD   GERD (gastroesophageal reflux disease)    Hypertension    Pulmonary nodules    a. 07/2018 Chest CT: RML and RLL nodules.   TIA (transient ischemic attack)     History reviewed. No pertinent surgical history.   Allergies  Allergies  Allergen Reactions   Codeine    Ivp Dye [Iodinated Diagnostic Agents] Hives and Other (See Comments)    dizzy   Percocet [Oxycodone-Acetaminophen] Hives and Nausea Only    History of Present Illness    78 y/o ? with a h/o atypical c/p, coronary calcium, HTN, HL, DMII, CKD III, tob abuse, COPD, TIA, carotid dzs, and GERD.  He has a long h/o atypical chest pain with prior negative stress tests in 2012 and 2014.  More recently, he was seen by [facility] cardiology w/ chest pain that improved w/ albuterol and/or PPI.  Echo in 04/2019 showed nl EF.  Stress testing in Sept 2020 was non-ischemic.  He subsequently saw  GI and underwent EGD @ [facility] in October, which showed erosiv gastropathy w/o bleeding.  He was advised to cont PPI therapy.  Pt lives locally and has had significant grief since losing his wife earlier this year.  He thinks his grief is driving much of his chest pain Ss.    Since his stress test and EGD, he has continued to have intermittent chest discomfort, that might occur at anytime, w/o rhyme or reason.  This AM, he awoke w/ left sided c/p that radiated to his left arm.  This was similar to prior episodes and was not worse w/ palpation or position changes, but does worsen w/ deep breathing.  Due to ongonig pain, he called EMS and was taken to the [facility] ED.  Here, ECG non-acute.  Moderately hypertensive on arrival - 164/83.  Initial trop 108.  He continues to report 6/10 chest discomfort - no acute distress.  Of note, in setting of covid swab, swab broke off in L nare.  Seen by ENT w/ unremarkable scope.  CT of sinuses pending.  Inpatient Medications     [START ON 08/30/2019] aspirin EC  81 mg Oral Daily   [START ON 08/30/2019] aspirin EC  81 mg Oral Daily   gabapentin  100 mg Oral QHS   insulin aspart  0-9 Units Subcutaneous TID WC  lidocaine (cardiac) 100 mg/11mL       [START ON 08/30/2019] lisinopril  20 mg Oral Daily   [START ON 08/30/2019] metoprolol succinate  12.5 mg Oral Daily   mometasone-formoterol  2 puff Inhalation BID   nicotine  14 mg Transdermal Daily   pantoprazole  40 mg Oral Daily   pravastatin  40 mg Oral q1800   tiotropium  1 capsule Inhalation Daily    Family History    No FH of premature CAD.  Social History    Social History   Socioeconomic History   Marital status: Married    Spouse name: Not on file   Number of children: Not on file   Years of education: Not on file   Highest education level: Not on file  Occupational History   Not on file  Tobacco Use   Smoking status: Current Every Day Smoker    Packs/day: 0.50    Years: 60.00     Pack years: 30.00   Smokeless tobacco: Never Used   Tobacco comment: still smoking 1/2 ppd.  Substance and Sexual Activity   Alcohol use: No   Drug use: No   Sexual activity: Not on file  Other Topics Concern   Not on file  Social History Narrative   Lives locally by himself.  Children live locally but work and he says he is mostly alone.  Cont to smoke up to 1/2 ppd.  Does not routinely exercise.   Social Determinants of Health   Financial Resource Strain:    Difficulty of Paying Living Expenses: Not on file  Food Insecurity:    Worried About Charity fundraiser in the Last Year: Not on file   [employer] of Food in the Last Year: Not on file  Transportation Needs:    Lack of Transportation (Medical): Not on file   Lack of Transportation (Non-Medical): Not on file  Physical Activity:    Days of Exercise per Week: Not on file   Minutes of Exercise per Session: Not on file  Stress:    Feeling of Stress : Not on file  Social Connections:    Frequency of Communication with Friends and Family: Not on file   Frequency of Social Gatherings with Friends and Family: Not on file   Attends Religious Services: Not on file   Active Member of Clubs or Organizations: Not on file   Attends Archivist Meetings: Not on file   Marital Status: Not on file  Intimate Partner Violence:    Fear of Current or Ex-Partner: Not on file   Emotionally Abused: Not on file   Physically Abused: Not on file   Sexually Abused: Not on file     Review of Systems    General:  No chills, fever, night sweats or weight changes.  Cardiovascular:  +++ chest pain, no dyspnea on exertion, edema, orthopnea, palpitations, paroxysmal nocturnal dyspnea. Dermatological: No rash, lesions/masses Respiratory: No cough, dyspnea Urologic: No hematuria, dysuria Abdominal:   No nausea, vomiting, diarrhea, bright red blood per rectum, melena, or hematemesis Neurologic:  No visual changes, wkns,  changes in mental status. All other systems reviewed and are otherwise negative except as noted above.  Physical Exam    Blood pressure (!) 154/82, pulse 99, temperature 98.3 F (36.8 C), temperature source Oral, resp. rate (!) 21, height 5\' 7"  (1.702 m), weight 63.5 kg, SpO2 93 %.  General: Pleasant, NAD Psych: Normal affect. Neuro: Alert and oriented X 3. Moves all extremities  spontaneously. HEENT: Normal  Neck: Supple without bruits or JVD. Lungs:  Resp regular and unlabored, diminished breath sounds bilat. Heart: RRR no s3, s4, or murmurs. Abdomen: Soft, non-tender, non-distended, BS + x 4.  Extremities: No clubbing, cyanosis or edema. DP/PT/Radials 2+ and equal bilaterally.  Labs    Cardiac Enzymes Recent Labs  Lab 08/29/19 0619 08/29/19 0959  TROPONINIHS 108* 110*      Lab Results  Component Value Date   WBC 9.6 08/29/2019   HGB 11.1 (L) 08/29/2019   HCT 34.2 (L) 08/29/2019   MCV 71.3 (L) 08/29/2019   PLT 244 08/29/2019    Recent Labs  Lab 08/29/19 0712  NA 135  K 3.7  CL 97*  CO2 26  BUN 15  CREATININE 0.73  CALCIUM 8.2*  PROT 7.2  BILITOT 0.8  ALKPHOS 85  ALT 14  AST 25  GLUCOSE 175*   Lab Results  Component Value Date   CHOL 96 10/16/2013   HDL 30 (L) 10/16/2013   LDLCALC 50 10/16/2013   TRIG 81 10/16/2013     Radiology Studies    DG Chest Port 1 View  Result Date: 08/29/2019 CLINICAL DATA:  Chest pain and shortness of breath EXAM: PORTABLE CHEST 1 VIEW COMPARISON:  03/15/2018 FINDINGS: There are hazy opacities at the left lung base. Cardiomediastinal contours are normal. No pneumothorax or sizable pleural effusion. IMPRESSION: Hazy opacities at the left lung base, possibly developing consolidation. Upright PA and lateral radiographs may be helpful for further characterization. Electronically Signed   By: Ulyses Jarred M.D.   On: 08/29/2019 03:17   CT MAXILLOFACIAL WO CONTRAST  Result Date: 08/29/2019 CLINICAL DATA:  Mass, lump or  swelling, max face. Additional history provided: COVID probe tip disappearance, COVID swab tip came off in patient's left nostril. Evaluate for foreign body. EXAM: CT MAXILLOFACIAL WITHOUT CONTRAST TECHNIQUE: Multidetector CT imaging of the maxillofacial structures was performed. Multiplanar CT image reconstructions were also generated. COMPARISON:  Head CT 11/28/2015 FINDINGS: Osseous: No maxillofacial fracture or suspicious osseous lesion. Orbits: The right orbit is unremarkable.  Left globe prosthesis Sinuses: Postsurgical appearance of the paranasal sinuses with bilateral antrostomies and sequela of prior bilateral middle turbinectomy, frontal sinusotomy and ethmoidectomy. Hypoplastic left frontal sinus. The frontal sinuses are well aerated. Mild scattered mucosal thickening within the ethmoidectomy cavities. A small mucous retention cyst is present within the right ethmoidectomy cavity. Mild mucosal thickening within the sphenoid sinuses. Mild mucosal thickening within the posteroinferior right maxillary sinus. The left maxillary sinus is well aerated. The antrostomies are widely patent. The nasal passages are patent. Mild S-shaped deviation of the bony nasal septum. No foreign body is appreciable. Soft tissues: Calcified plaque within the carotid arteries. The visualized maxillofacial and upper neck soft tissues are otherwise unremarkable. Limited intracranial: Generalized parenchymal atrophy of the visualized brain. IMPRESSION: No appreciable foreign body visualized, although evaluation for non radiopaque objects is somewhat limited. Sinonasal postoperative changes. Mild paranasal sinus mucosal thickening as described and greatest within the posterior inferior right maxillary sinus. Small mucous retention cyst within the right ethmoidectomy cavity. Electronically Signed   By: Kellie Simmering DO   On: 08/29/2019 09:19    ECG & Cardiac Imaging    Sinus tachycardia, 103, pvc's, antsept infarct, prolonged QTc,  no acute changes - personally reviewed.  Assessment & Plan    1.  Chest pain/Elevated troponin: Patient with a somewhat long history of atypical chest pain with prior negative Myoview dating back to 2012.  He was more  recently evaluated by [facility] in the setting of atypical chest pain that improved with the PPI and or albuterol.  He underwent stress testing in October due to recurrent chest pain, and this was low risk.  An echocardiogram in August showed normal LV function.  He presented to the ED overnight secondary to recurrent chest pain radiating to his left arm.  This was somewhat worse with deep breathing.  ECG nonacute.  Troponin initially 108 and then subsequently rose to 110.  He says the chest pain is currently 6/10 though is in good spirits and hemodynamically stable.  I suspect his chest pain is noncardiac.  In light of flat troponin with recent nonischemic stress testing, I would not recommend further ischemic evaluation at this time and rather, he can follow-up with his outpatient cardiologist.  Agree with aspirin, beta-blocker, ACE inhibitor, and statin therapy.  2.  Essential HTN: Blood pressure elevated in the ER.  Plan to resume home dose of beta-blocker, ACE inhibitor, and calcium channel blocker on discharge.  Titrate as necessary.  3.  HL: On lovastatin at home.  Continue.  4.  Tob Abuse: Still smoking half a pack a day.  Cessation advised.  5.  DMII: Per internal medicine.  6. CKD III: Creatinine stable at 0.73.  Signed, Murray Hodgkins, NP 08/29/2019, 11:30 AM  For questions or updates, please contact   Please consult [email or web address] for contact info under Cardiology/STEMI.

## 2019-08-29 NOTE — Progress Notes (Signed)
ANTICOAGULATION CONSULT NOTE - Initial Consult  Pharmacy Consult for Heparin Indication: chest pain/ACS  Allergies  Allergen Reactions  . Codeine   . Ivp Dye [Iodinated Diagnostic Agents] Hives and Other (See Comments)    dizzy  . Percocet [Oxycodone-Acetaminophen] Hives and Nausea Only    Patient Measurements: Height: 5\' 7"  (170.2 cm) Weight: 140 lb (63.5 kg) IBW/kg (Calculated) : 66.1 HEPARIN DW (KG): 63.5  Vital Signs: Temp: 98.3 F (36.8 C) (12/10 0159) Temp Source: Oral (12/10 0159) BP: 164/83 (12/10 0159) Pulse Rate: 94 (12/10 0159)  Labs: No results for input(s): HGB, HCT, PLT, APTT, LABPROT, INR, HEPARINUNFRC, HEPRLOWMOCWT, CREATININE, CKTOTAL, CKMB, TROPONINIHS in the last 72 hours.  CrCl cannot be calculated (Patient's most recent lab result is older than the maximum 21 days allowed.).   Medical History: Past Medical History:  Diagnosis Date  . COPD (chronic obstructive pulmonary disease) ([place])   . Coronary artery disease   . Diabetes mellitus without complication ([place])   . Hypertension   . TIA (transient ischemic attack)     Medications:  (Not in a hospital admission)   Assessment: Pharmacy asked to initiate and monitor Heparin for CP.  No anticoagulants PTA per med list.  Baseline labs ordered.  Goal of Therapy:  Heparin level 0.3-0.7 units/ml Monitor platelets by anticoagulation protocol: Yes   Plan:  Heparin bolus 3800 units x 1 then infusion at 750 units/hr Check HL in 8 hours  [place] Crane, Jethro Radke A 08/29/2019,3:27 AM

## 2019-08-29 NOTE — ED Notes (Addendum)
When Patient was swabbed with COVID swab, tip of swab apparently broke inside of patients left nostril. EDP made aware. Patient tried to blow nose to see if would come out. Tip did not come out.

## 2019-08-29 NOTE — Progress Notes (Signed)
[place] for Heparin Indication: chest pain/ACS  Allergies  Allergen Reactions  . Codeine   . Ivp Dye [Iodinated Diagnostic Agents] Hives and Other (See Comments)    dizzy  . Percocet [Oxycodone-Acetaminophen] Hives and Nausea Only    Patient Measurements: Height: 5\' 7"  (170.2 cm) Weight: 140 lb (63.5 kg) IBW/kg (Calculated) : 66.1 HEPARIN DW (KG): 63.5  Vital Signs: Temp: 98.3 F (36.8 C) (12/10 0159) Temp Source: Oral (12/10 0159) BP: 166/85 (12/10 1130) Pulse Rate: 88 (12/10 1130)  Labs: Recent Labs    08/29/19 0204 08/29/19 0619 08/29/19 0712 08/29/19 0959 08/29/19 1127  HGB  --   --  11.1*  --   --   HCT  --   --  34.2*  --   --   PLT  --   --  244  --   --   APTT 26  --   --   --   --   LABPROT 13.1  --   --   --   --   INR 1.0  --   --   --   --   HEPARINUNFRC  --   --   --   --  0.18*  CREATININE  --   --  0.73  --   --   TROPONINIHS  --  108*  --  110*  --     Estimated Creatinine Clearance: 69.5 mL/min (by C-G formula based on SCr of 0.73 mg/dL).   Medical History: Past Medical History:  Diagnosis Date  . Atypical chest pain    a. 05/2011 Neg MV; b. 04/2013 Neg MV; c. 04/2019 Echo: EF 55-60%, mild BAE. Degen MV dzs. Nl RV fxn; d. 06/2019 MV: EF 55%, small, mild, fixed apical and apical ant defect - probable artifact. No ischemia.  Marland Kitchen COPD (chronic obstructive pulmonary disease) ([place])   . Coronary artery calcification seen on CT scan    a. 07/2018 CT Chest - cor Ca2+.  . Diabetes mellitus without complication ([place])   . Erosive gastropathy    a. 06/2019 EGD  . GERD (gastroesophageal reflux disease)   . Hypertension   . Pulmonary nodules    a. 07/2018 Chest CT: RML and RLL nodules.  Marland Kitchen TIA (transient ischemic attack)     Medications:  (Not in a hospital admission)   Assessment: Pharmacy asked to initiate and monitor Heparin for CP.  No anticoagulants PTA per med list.   12/10@ 1127 HL: 0.18,  Subtherapeutic. Confirmed with nurse no heparin interruptions.   Goal of Therapy:  Heparin level 0.3-0.7 units/ml Monitor platelets by anticoagulation protocol: Yes   Plan:  Baseline labs have been ordered  Heparin DW: 63.5 kg  Give 1900 units bolus x 1 Increase heparin infusion to 900 units/hr Check anti-Xa level in 6 hours and daily while on heparin, per protocol Continue to monitor H&H and platelets   Abass Misener R Yaneisy Wenz 08/29/2019,12:41 PM

## 2019-08-29 NOTE — H&P (Addendum)
History and Physical    [name] [id number] DOB: [birth year] DOA: 08/29/2019  I have briefly reviewed the patient's prior medical records in [place]  PCP: Joanie Coddington, MD  Patient coming from: home  Chief Complaint: chest pain  HPI: [name] is a 78 y.o. male with medical history significant of COPD, CAD, DM, HTN, prior TIA who presents to the hospital with chief complain of chest pain. Patient was awaken from his sleep around 2 am with mid sternal chest pain, irradiating into his left shoulder and jaw. He has experienced chest pain like this in the past, few months ago, he tells me he was evaluated at [facility] and underwent a stress test which showed a fixed defect involving the apical and apical anterior segments. This morning he denies shortness of breath with his chest pain, denies abdominal discomfort, nausea or vomiting. His chest pain is still persistent currently but much improved than when he first arrived to the ER. He is also feeling a bit anxious with this. He recently lost his wife about a month ago and has been grieving. He continues to smoke, denies ETOH intake. He denies palpitations. Denies LE swelling or weight gain.  He denies any wheezing, no cough or chest congestion.  ED Course: in the ER he is afebrile and overall his vitals are stable. Epic had a 2h downtime and no labs were sent, now in process, however initial troponin is elevated around 100. He was placed on heparin gtt. EKG without STEMI. He was also given aspirin. Cardiology was consulted and we were asked to admit.  In the ED also when he got tested for SARS-CoV-2 the tip of the probe has remained stuck into his nostril.  Review of Systems: All systems reviewed, and apart from HPI, all negative  Past Medical History:  Diagnosis Date  . COPD (chronic obstructive pulmonary disease) ([place])   . Coronary artery disease   . Diabetes mellitus without complication ([place])   . Hypertension   . TIA  (transient ischemic attack)     History reviewed. No pertinent surgical history.   reports that he has been smoking. He has never used smokeless tobacco. He reports that he does not drink alcohol or use drugs.  Allergies  Allergen Reactions  . Codeine   . Ivp Dye [Iodinated Diagnostic Agents] Hives and Other (See Comments)    dizzy  . Percocet [Oxycodone-Acetaminophen] Hives and Nausea Only    History reviewed. No pertinent family history.  Prior to Admission medications   Medication Sig Start Date End Date Taking? Authorizing Provider  albuterol (PROAIR HFA) 108 (90 Base) MCG/ACT inhaler Inhale 2 puffs every 6 (six) hours as needed into the lungs. 12/07/15  Yes [provider]  amLODipine (NORVASC) 10 MG tablet Take 1 tablet (10 mg total) by mouth daily. 05/30/16  Yes Hower, Aaron Mose, MD  aspirin EC 81 MG tablet Take 81 mg daily by mouth. 11/16/06  Yes [provider]  gabapentin (NEURONTIN) 100 MG capsule Take 100 mg by mouth at bedtime. 08/06/19  Yes [provider]  lovastatin (MEVACOR) 40 MG tablet Take 40 mg by mouth at bedtime.   Yes [provider]  MELATONIN PO Take 5 mg by mouth at bedtime as needed.   Yes [provider]  metFORMIN (GLUCOPHAGE) 850 MG tablet Take 850 mg by mouth 2 (two) times daily with a meal.    Yes [provider]  metoprolol succinate (TOPROL-XL) 25 MG 24 hr tablet Take  12.5 mg by mouth daily. 08/02/19  Yes [provider]  omeprazole (PRILOSEC) 20 MG capsule Take 40 mg by mouth 2 (two) times daily before a meal.    Yes [provider]  budesonide-formoterol (SYMBICORT) 160-4.5 MCG/ACT inhaler Inhale 2 puffs into the lungs 2 (two) times daily.    [provider]  cyclobenzaprine (FLEXERIL) 5 MG tablet Take 1 tablet (5 mg total) by mouth 3 (three) times daily. Patient not taking: Reported on 08/29/2019 05/30/16   Hower, Aaron Mose, MD  feeding supplement, ENSURE ENLIVE, (ENSURE  ENLIVE) LIQD Take 237 mLs by mouth 2 (two) times daily between meals. Patient not taking: Reported on 07/27/2017 05/30/16   Hower, Aaron Mose, MD  ketorolac (TORADOL) 10 MG tablet Take 1 tablet (10 mg total) by mouth every 6 (six) hours as needed. Patient not taking: Reported on 07/27/2017 05/30/16   Hower, Aaron Mose, MD  lisinopril (PRINIVIL,ZESTRIL) 20 MG tablet Take 20 mg by mouth daily.    [provider]  predniSONE (STERAPRED UNI-PAK 21 TAB) 10 MG (21) TBPK tablet 40mg  oral 1 day, then 20mg  oral for 2 days, then 10mg  oral 2 days, then stop Patient not taking: Reported on 07/27/2017 05/30/16   Hower, Aaron Mose, MD  tiotropium (SPIRIVA) 18 MCG inhalation capsule Place 1 capsule into inhaler and inhale daily. 12/07/15   [provider]    Physical Exam: Vitals:   08/29/19 0545 08/29/19 0600 08/29/19 0615 08/29/19 0630  BP:  (!) 178/80  (!) 162/77  Pulse: 94 100 (!) 50 (!) 51  Resp: 18 (!) 22 18 18   Temp:      TempSrc:      SpO2: 94% 94% 96% 96%  Weight:      Height:        Constitutional: NAD, calm, comfortable Eyes: PERRL, lids and conjunctivae normal ENMT: Mucous membranes are moist.  Neck: normal, supple Respiratory: clear to auscultation bilaterally, no wheezing, no crackles. Normal respiratory effort.  Cardiovascular: Regular rate and rhythm, no murmurs / rubs / gallops. No extremity edema.  Abdomen: no tenderness, no masses palpated.  Musculoskeletal: no clubbing / cyanosis Skin: no rashes Neurologic: CN 2-12 grossly intact. Strength 5/5 in all 4.  Psychiatric: Normal judgment and insight. Alert and oriented x 3. Normal mood.   Labs on Admission: I have personally reviewed following labs and imaging studies  CBC: No results for input(s): WBC, NEUTROABS, HGB, HCT, MCV, PLT in the last 168 hours. Basic Metabolic Panel: No results for input(s): NA, K, CL, CO2, GLUCOSE, BUN, CREATININE, CALCIUM, MG, PHOS in the last 168 hours. Liver Function Tests: No results for  input(s): AST, ALT, ALKPHOS, BILITOT, PROT, ALBUMIN in the last 168 hours. Coagulation Profile: Recent Labs  Lab 08/29/19 0204  INR 1.0   BNP (last 3 results) No results for input(s): PROBNP in the last 8760 hours. CBG: No results for input(s): GLUCAP in the last 168 hours. Thyroid Function Tests: No results for input(s): TSH, T4TOTAL, FREET4, T3FREE, THYROIDAB in the last 72 hours. Urine analysis:    Component Value Date/Time   COLORURINE YELLOW (A) 07/27/2017 1503   APPEARANCEUR CLEAR (A) 07/27/2017 1503   APPEARANCEUR Clear 10/16/2013 0435   LABSPEC 1.016 07/27/2017 1503   LABSPEC 1.015 10/16/2013 0435   PHURINE 6.0 07/27/2017 1503   GLUCOSEU NEGATIVE 07/27/2017 1503   GLUCOSEU Negative 10/16/2013 0435   HGBUR NEGATIVE 07/27/2017 1503   BILIRUBINUR NEGATIVE 07/27/2017 1503   BILIRUBINUR Negative 10/16/2013 [place] 07/27/2017  [place] (A) 07/27/2017 1503   NITRITE NEGATIVE 07/27/2017 1503   LEUKOCYTESUR NEGATIVE 07/27/2017 1503   LEUKOCYTESUR Negative 10/16/2013 0435     Radiological Exams on Admission: DG Chest Port 1 View  Result Date: 08/29/2019 CLINICAL DATA:  Chest pain and shortness of breath EXAM: PORTABLE CHEST 1 VIEW COMPARISON:  03/15/2018 FINDINGS: There are hazy opacities at the left lung base. Cardiomediastinal contours are normal. No pneumothorax or sizable pleural effusion. IMPRESSION: Hazy opacities at the left lung base, possibly developing consolidation. Upright PA and lateral radiographs may be helpful for further characterization. Electronically Signed   By: Ulyses Jarred M.D.   On: 08/29/2019 03:17    EKG: Independently reviewed. Sinus rhythm, PVCs present.   Assessment/Plan  Principal Problem Chest pain -with several typical symptoms concerning for ACS, initial troponin is elevated, will trend -cardiology consulted, continue heparin gtt -already given aspirin   Active Problems COPD -no wheezing, respiratory  status stable, continue home medications with Spiriva, Dulera, albuterol as needed  HTN -hold Norvasc, continue betablocker, ACEI  HLD -continue statins  Tobacco use -nicotine patch, counseled for cessation  Broken tip for SARS-CoV-2 probe, left nostril -ENT asked to evaluate, unable to retrieve currently  Type 2 diabetes mellitus -Hold Metformin, placed on sliding scale  DVT prophylaxis: Lovenox  Code Status: Full code  Family Communication: d/w patient  Disposition Plan: home when ready  Bed Type: telemetry  Consults called: cardiology, Dr. Candis Musa Obs/Inp: obs  Marzetta Board, MD, PhD [facility]  Contact via [email or web address]  08/29/2019, 7:55 AM

## 2019-08-29 NOTE — ED Notes (Signed)
Pt given breakfast tray at this time. 

## 2019-08-29 NOTE — Consult Note (Signed)
[place] [id number] July 16, 1941 Caren Griffins, MD  Reason for Consult: foreign body left nostril  HPI: 78 year old male admitted to ER with chest pain and elevated troponin.  While undergoing evaluation tip of COVID testing swab broke off into left nasal cavity per report.  Attending ER physician attempted to retrieve but was unsuccessful.  I was asked to evaluate.  Patient reports some mild irritation in left nostril but unable to tell if related to prior removal attempts.  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  Allergies:  Allergies  Allergen Reactions  . Codeine   . Ivp Dye [Iodinated Diagnostic Agents] Hives and Other (See Comments)    dizzy  . Percocet [Oxycodone-Acetaminophen] Hives and Nausea Only    ROS: Review of systems normal other than 12 systems except per HPI.  PMH:  Past Medical History:  Diagnosis Date  . COPD (chronic obstructive pulmonary disease) ([place])   . Coronary artery disease   . Diabetes mellitus without complication ([place])   . Hypertension   . TIA (transient ischemic attack)     FH: History reviewed. No pertinent family history.  SH:  Social History   Socioeconomic History  . Marital status: Married    Spouse name: Not on file  . Number of children: Not on file  . Years of education: Not on file  . Highest education level: Not on file  Occupational History  . Not on file  Tobacco Use  . Smoking status: Current Every Day Smoker  . Smokeless tobacco: Never Used  Substance and Sexual Activity  . Alcohol use: No  . Drug use: No  . Sexual activity: Not on file  Other Topics Concern  . Not on file  Social History Narrative  . Not on file   Social Determinants of Health   Financial Resource Strain:   . Difficulty of Paying Living Expenses: Not on file  Food Insecurity:   . Worried About Charity fundraiser in the Last Year: Not on file  . Ran Out of Food in the Last Year: Not on file  Transportation Needs:   .  Lack of Transportation (Medical): Not on file  . Lack of Transportation (Non-Medical): Not on file  Physical Activity:   . Days of Exercise per Week: Not on file  . Minutes of Exercise per Session: Not on file  Stress:   . Feeling of Stress : Not on file  Social Connections:   . Frequency of Communication with Friends and Family: Not on file  . Frequency of Social Gatherings with Friends and Family: Not on file  . Attends Religious Services: Not on file  . Active Member of Clubs or Organizations: Not on file  . Attends Archivist Meetings: Not on file  . Marital Status: Not on file  Intimate Partner Violence:   . Fear of Current or Ex-Partner: Not on file  . Emotionally Abused: Not on file  . Physically Abused: Not on file  . Sexually Abused: Not on file    PSH: History reviewed. No pertinent surgical history.  Physical  Exam:  GEN-  CN 2-12 grossly intact and symmetric. EARS-  External ears clear NOSE- clear anteriorly with no mucopus or erythema or visible foreign body OC/OP-  No masses or lesions NECK-  No LAD RESP- unlabored CARD-  Irregular.  Procedure-  Nasal Endscopy-  After verbal consent was obtained, the patient's right and left nasal cavity was anesthetized with Afrin and viscous  lidocaine.  A flexible fiber optic scope was inserted into the patient's left nasal cavity.  This demonstrated prior surgical findings with widely patent front sinus outflow tract, widely patent left maxillary antrostomy, previous ethemoidectomy and mild polypoid degeneration of mucosa with thick mucus.  No foreign body seen in sinus cavity nor nasopharynx.  Endoscope was inserted on the patient's right side with similar findings and clear nasopharynx.   A/P: Possible foreign body in nasal cavity in patient with prior sinus surgery and nasal polyposis  Plan:  Discussed findings with patient in that multiple endoscopies did not reveal any obvious foreign body.  Recommend CT scan of  sinuses when patient is able for further evaluation.  Could also consider outpatient repeat endoscopy depending on findings of CT scan.  Discussed that if foreign body became dislodged into one of patient's sinus cavity, he may require general anesthesia in future for retrieval.   Carloyn Manner 08/29/2019 8:21 AM

## 2019-08-29 NOTE — Discharge Summary (Addendum)
Physician Discharge Summary  Kendel Kross [id number] DOB: [birth year] DOA: 08/29/2019  PCP: Joanie Coddington, MD  Admit date: 08/29/2019 Discharge date: 08/29/2019  Admitted From: home Disposition:  home  Recommendations for Outpatient Follow-up:  1. Follow up with PCP in 1-2 weeks 2. Follow-up with cardiology at [facility] 3. Follow-up with Dr. Jeannie Fend with ENT within 2 weeks  Home Health: None Equipment/Devices: None  Discharge Condition: Stable CODE STATUS: Full code Diet recommendation: Heart healthy  HPI / Hospital Course / Discharge diagnoses: Patient admitted earlier this morning by me, please refer to HPI /H&P for full details, in brief he presented with chest pain.  Cardiology was consulted and patient was started on heparin infusion, he ruled out for ACS as his troponins have essentially remained flat, his EKG was nonischemic and cardiology recommended no further inpatient work-up, and he will be discharged home with outpatient follow-up with his primary cardiologist at [facility].  Of note, chest x-ray on admission shows a potential hazy opacity at the left lung base, however patient is afebrile, has no leukocytosis and no respiratory symptoms.  Also, when he SARS-CoV-2 was tested the tip of the probe has been broken and apparently has been stuck inside the left nostril.  This was unable to be retrieved, ENT was consulted but they were unable to find it, he underwent a CT scan maxillofacial which again was negative for foreign objects.  Dr. Jeannie Fend from ENT saw patient and recommended outpatient follow-up  Discharge Instructions   Allergies as of 08/29/2019      Reactions   Codeine    Ivp Dye [iodinated Diagnostic Agents] Hives, Other (See Comments)   dizzy   Percocet [oxycodone-acetaminophen] Hives, Nausea Only      Medication List    STOP taking these medications   ketorolac 10 MG tablet Commonly known as: TORADOL   predniSONE 10 MG (21) Tbpk tablet Commonly known  as: STERAPRED UNI-PAK 21 TAB     TAKE these medications   amLODipine 10 MG tablet Commonly known as: NORVASC Take 1 tablet (10 mg total) by mouth daily.   aspirin EC 81 MG tablet Take 81 mg daily by mouth.   cyclobenzaprine 5 MG tablet Commonly known as: FLEXERIL Take 1 tablet (5 mg total) by mouth 3 (three) times daily.   feeding supplement (ENSURE ENLIVE) Liqd Take 237 mLs by mouth 2 (two) times daily between meals.   gabapentin 100 MG capsule Commonly known as: NEURONTIN Take 100 mg by mouth at bedtime.   lisinopril 20 MG tablet Commonly known as: ZESTRIL Take 20 mg by mouth daily.   lovastatin 40 MG tablet Commonly known as: MEVACOR Take 40 mg by mouth at bedtime.   MELATONIN PO Take 5 mg by mouth at bedtime as needed.   metFORMIN 850 MG tablet Commonly known as: GLUCOPHAGE Take 850 mg by mouth 2 (two) times daily with a meal.   metoprolol succinate 25 MG 24 hr tablet Commonly known as: TOPROL-XL Take 12.5 mg by mouth daily.   omeprazole 20 MG capsule Commonly known as: PRILOSEC Take 40 mg by mouth 2 (two) times daily before a meal.   ProAir HFA 108 (90 Base) MCG/ACT inhaler Generic drug: albuterol Inhale 2 puffs every 6 (six) hours as needed into the lungs.   Symbicort 160-4.5 MCG/ACT inhaler Generic drug: budesonide-formoterol Inhale 2 puffs into the lungs 2 (two) times daily.   tiotropium 18 MCG inhalation capsule Commonly known as: SPIRIVA Place 1 capsule into inhaler and inhale daily.  Follow-up Information    Joanie Coddington, MD. Schedule an appointment as soon as possible for a visit in 2 week(s).   Specialty: Family Medicine Contact information: [place] [address] [phone]           Consultations:  Cardiology  ENT  Procedures/Studies:  [facility] Chest Port 1 View  Result Date: 08/29/2019 CLINICAL DATA:  Chest pain and shortness of breath EXAM: PORTABLE CHEST 1 VIEW COMPARISON:  03/15/2018  FINDINGS: There are hazy opacities at the left lung base. Cardiomediastinal contours are normal. No pneumothorax or sizable pleural effusion. IMPRESSION: Hazy opacities at the left lung base, possibly developing consolidation. Upright PA and lateral radiographs may be helpful for further characterization. Electronically Signed   By: Ulyses Jarred M.D.   On: 08/29/2019 03:17   CT MAXILLOFACIAL WO CONTRAST  Result Date: 08/29/2019 CLINICAL DATA:  Mass, lump or swelling, max face. Additional history provided: COVID probe tip disappearance, COVID swab tip came off in patient's left nostril. Evaluate for foreign body. EXAM: CT MAXILLOFACIAL WITHOUT CONTRAST TECHNIQUE: Multidetector CT imaging of the maxillofacial structures was performed. Multiplanar CT image reconstructions were also generated. COMPARISON:  Head CT 11/28/2015 FINDINGS: Osseous: No maxillofacial fracture or suspicious osseous lesion. Orbits: The right orbit is unremarkable.  Left globe prosthesis Sinuses: Postsurgical appearance of the paranasal sinuses with bilateral antrostomies and sequela of prior bilateral middle turbinectomy, frontal sinusotomy and ethmoidectomy. Hypoplastic left frontal sinus. The frontal sinuses are well aerated. Mild scattered mucosal thickening within the ethmoidectomy cavities. A small mucous retention cyst is present within the right ethmoidectomy cavity. Mild mucosal thickening within the sphenoid sinuses. Mild mucosal thickening within the posteroinferior right maxillary sinus. The left maxillary sinus is well aerated. The antrostomies are widely patent. The nasal passages are patent. Mild S-shaped deviation of the bony nasal septum. No foreign body is appreciable. Soft tissues: Calcified plaque within the carotid arteries. The visualized maxillofacial and upper neck soft tissues are otherwise unremarkable. Limited intracranial: Generalized parenchymal atrophy of the visualized brain. IMPRESSION: No appreciable foreign  body visualized, although evaluation for non radiopaque objects is somewhat limited. Sinonasal postoperative changes. Mild paranasal sinus mucosal thickening as described and greatest within the posterior inferior right maxillary sinus. Small mucous retention cyst within the right ethmoidectomy cavity. Electronically Signed   By: Kellie Simmering DO   On: 08/29/2019 09:19     Subjective: - no chest pain, shortness of breath, no abdominal pain, nausea or vomiting.   Discharge Exam: BP (!) 170/91    Pulse 89    Temp 98.3 F (36.8 C) (Oral)    Resp 17    Ht 5\' 7"  (1.702 m)    Wt 63.5 kg    SpO2 96%    BMI 21.93 kg/m   General: Pt is alert, awake, not in acute distress Cardiovascular: RRR, S1/S2 +, no rubs, no gallops Respiratory: CTA bilaterally, no wheezing, no rhonchi Abdominal: Soft, NT, ND, bowel sounds + Extremities: no edema, no cyanosis    The results of significant diagnostics from this hospitalization (including imaging, microbiology, ancillary and laboratory) are listed below for reference.     Microbiology: No results found for this or any previous visit (from the past 240 hour(s)).   Labs: Basic Metabolic Panel: Recent Labs  Lab 08/29/19 0712  NA 135  K 3.7  CL 97*  CO2 26  GLUCOSE 175*  BUN 15  CREATININE 0.73  CALCIUM 8.2*   Liver Function Tests: Recent Labs  Lab 08/29/19 [phone]  AST 25  ALT 14  ALKPHOS 85  BILITOT 0.8  PROT 7.2  ALBUMIN 3.6   CBC: Recent Labs  Lab 08/29/19 0712  WBC 9.6  HGB 11.1*  HCT 34.2*  MCV 71.3*  PLT 244   CBG: Recent Labs  Lab 08/29/19 1125  GLUCAP 340*   Hgb A1c Recent Labs    08/29/19 0959  HGBA1C 7.0*   Lipid Profile No results for input(s): CHOL, HDL, LDLCALC, TRIG, CHOLHDL, LDLDIRECT in the last 72 hours. Thyroid function studies No results for input(s): TSH, T4TOTAL, T3FREE, THYROIDAB in the last 72 hours.  Invalid input(s): FREET3 Urinalysis    Component Value Date/Time   COLORURINE YELLOW (A)  07/27/2017 1503   APPEARANCEUR CLEAR (A) 07/27/2017 1503   APPEARANCEUR Clear 10/16/2013 0435   LABSPEC 1.016 07/27/2017 1503   LABSPEC 1.015 10/16/2013 0435   PHURINE 6.0 07/27/2017 1503   GLUCOSEU NEGATIVE 07/27/2017 1503   GLUCOSEU Negative 10/16/2013 0435   HGBUR NEGATIVE 07/27/2017 1503   BILIRUBINUR NEGATIVE 07/27/2017 1503   BILIRUBINUR Negative 10/16/2013 0435   KETONESUR NEGATIVE 07/27/2017 1503   PROTEINUR 30 (A) 07/27/2017 1503   NITRITE NEGATIVE 07/27/2017 1503   LEUKOCYTESUR NEGATIVE 07/27/2017 1503   LEUKOCYTESUR Negative 10/16/2013 0435    FURTHER DISCHARGE INSTRUCTIONS:   Get Medicines reviewed and adjusted: Please take all your medications with you for your next visit with your Primary MD   Laboratory/radiological data: Please request your Primary MD to go over all hospital tests and procedure/radiological results at the follow up, please ask your Primary MD to get all Hospital records sent to his/her office.   In some cases, they will be blood work, cultures and biopsy results pending at the time of your discharge. Please request that your primary care M.D. goes through all the records of your hospital data and follows up on these results.   Also Note the following: If you experience worsening of your admission symptoms, develop shortness of breath, life threatening emergency, suicidal or homicidal thoughts you must seek medical attention immediately by calling 911 or calling your MD immediately  if symptoms less severe.   You must read complete instructions/literature along with all the possible adverse reactions/side effects for all the Medicines you take and that have been prescribed to you. Take any new Medicines after you have completely understood and accpet all the possible adverse reactions/side effects.    Do not drive when taking Pain medications or sleeping medications (Benzodaizepines)   Do not take more than prescribed Pain, Sleep and Anxiety  Medications. It is not advisable to combine anxiety,sleep and pain medications without talking with your primary care practitioner   Special Instructions: If you have smoked or chewed Tobacco  in the last 2 yrs please stop smoking, stop any regular Alcohol  and or any Recreational drug use.   Wear Seat belts while driving.   Please note: You were cared for by a hospitalist during your hospital stay. Once you are discharged, your primary care physician will handle any further medical issues. Please note that NO REFILLS for any discharge medications will be authorized once you are discharged, as it is imperative that you return to your primary care physician (or establish a relationship with a primary care physician if you do not have one) for your post hospital discharge needs so that they can reassess your need for medications and monitor your lab values.  Time coordinating discharge: 25 minutes  SIGNED:  Marzetta Board, MD, PhD 08/29/2019, 1:43 PM

## 2019-08-29 NOTE — ED Triage Notes (Signed)
Patient from home coming to ER by [facility] for chest pain on left side  And in left arm. Per EMS patient states has been having pain in chest for a couple of hours before calling EMS. Per EMS has been depressed due to wife passing a month ago and has been having anxiety.

## 2019-09-01 ENCOUNTER — Emergency Department
Admission: EM | Admit: 2019-09-01 | Discharge: 2019-09-02 | Disposition: A | Discharge status: Home / Self Care | Payer: Medicare Other | Attending: Emergency Medicine | Admitting: Emergency Medicine

## 2019-09-01 DIAGNOSIS — I10 Essential (primary) hypertension: Secondary | ICD-10-CM | POA: Insufficient documentation

## 2019-09-01 DIAGNOSIS — Z7982 Long term (current) use of aspirin: Secondary | ICD-10-CM | POA: Diagnosis not present

## 2019-09-01 DIAGNOSIS — I2510 Atherosclerotic heart disease of native coronary artery without angina pectoris: Secondary | ICD-10-CM | POA: Insufficient documentation

## 2019-09-01 DIAGNOSIS — Z79899 Other long term (current) drug therapy: Secondary | ICD-10-CM | POA: Diagnosis not present

## 2019-09-01 DIAGNOSIS — F4322 Adjustment disorder with anxiety: Secondary | ICD-10-CM | POA: Diagnosis not present

## 2019-09-01 DIAGNOSIS — Z7984 Long term (current) use of oral hypoglycemic drugs: Secondary | ICD-10-CM | POA: Diagnosis not present

## 2019-09-01 DIAGNOSIS — F329 Major depressive disorder, single episode, unspecified: Secondary | ICD-10-CM | POA: Diagnosis not present

## 2019-09-01 DIAGNOSIS — F17210 Nicotine dependence, cigarettes, uncomplicated: Secondary | ICD-10-CM | POA: Diagnosis not present

## 2019-09-01 DIAGNOSIS — E119 Type 2 diabetes mellitus without complications: Secondary | ICD-10-CM | POA: Insufficient documentation

## 2019-09-01 DIAGNOSIS — F419 Anxiety disorder, unspecified: Secondary | ICD-10-CM | POA: Diagnosis present

## 2019-09-01 DIAGNOSIS — F4323 Adjustment disorder with mixed anxiety and depressed mood: Secondary | ICD-10-CM

## 2019-09-01 DIAGNOSIS — J449 Chronic obstructive pulmonary disease, unspecified: Secondary | ICD-10-CM | POA: Insufficient documentation

## 2019-09-01 LAB — CBC
HCT: 31.9 % — ABNORMAL LOW (ref 39.0–52.0)
Hemoglobin: 10.5 g/dL — ABNORMAL LOW (ref 13.0–17.0)
MCH: 23.6 pg — ABNORMAL LOW (ref 26.0–34.0)
MCHC: 32.9 g/dL (ref 30.0–36.0)
MCV: 71.8 fL — ABNORMAL LOW (ref 80.0–100.0)
Platelets: 189 10*3/uL (ref 150–400)
RBC: 4.44 MIL/uL (ref 4.22–5.81)
RDW: 16.6 % — ABNORMAL HIGH (ref 11.5–15.5)
WBC: 9.9 10*3/uL (ref 4.0–10.5)
nRBC: 0 % (ref 0.0–0.2)

## 2019-09-01 LAB — COMPREHENSIVE METABOLIC PANEL
ALT: 15 U/L (ref 0–44)
AST: 25 U/L (ref 15–41)
Albumin: 3.6 g/dL (ref 3.5–5.0)
Alkaline Phosphatase: 68 U/L (ref 38–126)
Anion gap: 14 (ref 5–15)
BUN: 23 mg/dL (ref 8–23)
CO2: 23 mmol/L (ref 22–32)
Calcium: 8.4 mg/dL — ABNORMAL LOW (ref 8.9–10.3)
Chloride: 95 mmol/L — ABNORMAL LOW (ref 98–111)
Creatinine, Ser: 0.94 mg/dL (ref 0.61–1.24)
GFR calc Af Amer: >60 mL/min (ref >60)
GFR calc non Af Amer: >60 mL/min (ref >60)
Glucose, Bld: 142 mg/dL — ABNORMAL HIGH (ref 70–99)
Potassium: 3.2 mmol/L — ABNORMAL LOW (ref 3.5–5.1)
Sodium: 132 mmol/L — ABNORMAL LOW (ref 135–145)
Total Bilirubin: 0.4 mg/dL (ref 0.3–1.2)
Total Protein: 6.8 g/dL (ref 6.5–8.1)

## 2019-09-01 NOTE — ED Provider Notes (Signed)
[facility] Emergency Department Provider Note ______________   First MD Initiated Contact with Patient 09/01/19 2352     (approximate)  I have reviewed the triage vital signs and the nursing notes.   HISTORY  Chief Complaint Anxiety    HPI [name] is a 78 y.o. male with below list of previous medical conditions presents to the emergency department secondary to feelings of anxiety with inability to sleep x1 month.  Patient states that his wife of 22 years died 66 month ago and ever since her death he has had difficulty sleeping with feelings of anxiety.  Patient denies any other symptoms.  Patient denies any suicidal ideation        Past Medical History:  Diagnosis Date  . Atypical chest pain    a. 05/2011 Neg MV; b. 04/2013 Neg MV; c. 04/2019 Echo: EF 55-60%, mild BAE. Degen MV dzs. Nl RV fxn; d. 06/2019 MV: EF 55%, small, mild, fixed apical and apical ant defect - probable artifact. No ischemia.  Marland Kitchen COPD (chronic obstructive pulmonary disease) ([place])   . Coronary artery calcification seen on CT scan    a. 07/2018 CT Chest - cor Ca2+.  . Diabetes mellitus without complication ([place])   . Erosive gastropathy    a. 06/2019 EGD  . GERD (gastroesophageal reflux disease)   . Hypertension   . Pulmonary nodules    a. 07/2018 Chest CT: RML and RLL nodules.  Marland Kitchen TIA (transient ischemic attack)     Patient Active Problem List   Diagnosis Date Noted  . Chest pain with high risk of acute coronary syndrome 08/29/2019  . Near syncope 05/28/2016  . COPD exacerbation ([place]) 05/27/2016    History reviewed. No pertinent surgical history.  Prior to Admission medications   Medication Sig Start Date End Date Taking? Authorizing Provider  albuterol (PROAIR HFA) 108 (90 Base) MCG/ACT inhaler Inhale 2 puffs every 6 (six) hours as needed into the lungs. 12/07/15   [provider]  amLODipine (NORVASC) 10 MG tablet Take 1 tablet (10 mg total) by mouth daily.  05/30/16   Hower, Aaron Mose, MD  aspirin EC 81 MG tablet Take 81 mg daily by mouth. 11/16/06   [provider]  budesonide-formoterol (SYMBICORT) 160-4.5 MCG/ACT inhaler Inhale 2 puffs into the lungs 2 (two) times daily.    [provider]  cyclobenzaprine (FLEXERIL) 5 MG tablet Take 1 tablet (5 mg total) by mouth 3 (three) times daily. Patient not taking: Reported on 08/29/2019 05/30/16   Hower, Aaron Mose, MD  feeding supplement, ENSURE ENLIVE, (ENSURE ENLIVE) LIQD Take 237 mLs by mouth 2 (two) times daily between meals. Patient not taking: Reported on 07/27/2017 05/30/16   Hower, Aaron Mose, MD  gabapentin (NEURONTIN) 100 MG capsule Take 100 mg by mouth at bedtime. 08/06/19   [provider]  lisinopril (PRINIVIL,ZESTRIL) 20 MG tablet Take 20 mg by mouth daily.    [provider]  lovastatin (MEVACOR) 40 MG tablet Take 40 mg by mouth at bedtime.    [provider]  MELATONIN PO Take 5 mg by mouth at bedtime as needed.    [provider]  metFORMIN (GLUCOPHAGE) 850 MG tablet Take 850 mg by mouth 2 (two) times daily with a meal.     [provider]  metoprolol succinate (TOPROL-XL) 25 MG 24 hr tablet Take 12.5 mg by mouth daily. 08/02/19   [provider]  omeprazole (PRILOSEC) 20 MG capsule Take 40 mg by mouth  2 (two) times daily before a meal.     [provider]  tiotropium (SPIRIVA) 18 MCG inhalation capsule Place 1 capsule into inhaler and inhale daily. 12/07/15   [provider]    Allergies Codeine, Ivp dye [iodinated diagnostic agents], and Percocet [oxycodone-acetaminophen]  No family history on file.  Social History Social History   Tobacco Use  . Smoking status: Current Every Day Smoker    Packs/day: 0.50    Years: 60.00    Pack years: 30.00  . Smokeless tobacco: Never Used  . Tobacco comment: still smoking 1/2 ppd.  Substance Use Topics  . Alcohol use: No  . Drug use: No    Review of  Systems Constitutional: No fever/chills Eyes: No visual changes. ENT: No sore throat. Cardiovascular: Denies chest pain. Respiratory: Denies shortness of breath. Gastrointestinal: No abdominal pain.  No nausea, no vomiting.  No diarrhea.  No constipation. Genitourinary: Negative for dysuria. Musculoskeletal: Negative for neck pain.  Negative for back pain. Integumentary: Negative for rash. Neurological: Negative for headaches, focal weakness or numbness. Psychiatric:  Positive for "anxiety" and sleep disturbance ____________________________________________   PHYSICAL EXAM:  VITAL SIGNS: ED Triage Vitals  Enc Vitals Group     BP 09/01/19 1926 (!) 162/77     Pulse Rate 09/01/19 1926 (!) 106     Resp 09/01/19 1926 18     Temp 09/01/19 1926 98.6 F (37 C)     Temp Source 09/01/19 1926 Oral     SpO2 09/01/19 1926 96 %     Weight 09/01/19 1927 66.7 kg (147 lb)     Height 09/01/19 1927 1.702 m (5\' 7" )     Head Circumference --      Peak Flow --      Pain Score 09/01/19 1927 0     Pain Loc --      Pain Edu? --      Excl. in [place]? --     Constitutional: Alert and oriented.  Eyes: Conjunctivae are normal.  Mouth/Throat: Patient is wearing a mask. Neck: No stridor.  No meningeal signs.   Cardiovascular: Normal rate, regular rhythm. Good peripheral circulation. Grossly normal heart sounds. Respiratory: Normal respiratory effort.  No retractions. Gastrointestinal: Soft and nontender. No distention.  Musculoskeletal: No lower extremity tenderness nor edema. No gross deformities of extremities. Neurologic:  Normal speech and language. No gross focal neurologic deficits are appreciated.  Skin:  Skin is warm, dry and intact. Psychiatric: Mood and affect are normal. Speech and behavior are normal.  ____________________________________________   LABS (all labs ordered are listed, but only abnormal results are displayed)  Labs Reviewed  COMPREHENSIVE METABOLIC PANEL - Abnormal;  Notable for the following components:      Result Value   Sodium 132 (*)    Potassium 3.2 (*)    Chloride 95 (*)    Glucose, Bld 142 (*)    Calcium 8.4 (*)    All other components within normal limits  CBC - Abnormal; Notable for the following components:   Hemoglobin 10.5 (*)    HCT 31.9 (*)    MCV 71.8 (*)    MCH 23.6 (*)    RDW 16.6 (*)    All other components within normal limits     Procedures   ____________________________________________   INITIAL IMPRESSION / MDM / ASSESSMENT AND PLAN / ED COURSE  As part of my medical decision making, I reviewed the following data within the electronic MEDICAL RECORD NUMBER   78 year old  male presented with above-stated history and physical exam consistent with possible adjustment disorder with sleep disturbance.  Patient evaluated by psychiatry staff with recommendation to prescribe trazodone.  Patient also referred to outpatient psychiatry for further outpatient evaluation.  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 He was evaluated in the context of the global COVID-19 pandemic, which necessitated consideration that the patient might be at risk for infection with the SARS-CoV-2 virus that causes COVID-19. Institutional protocols and algorithms that pertain to the evaluation of patients at risk for COVID-19 are in a state of rapid change based on information released by regulatory bodies including the CDC and federal and state organizations. These policies and algorithms were followed during the patient's care in the ED.  Some ED evaluations and interventions may be delayed as a  result of limited staffing during the pandemic.*  Note:  This document was prepared using Dragon voice recognition software and may include unintentional dictation errors.   Gregor Hams, MD 09/02/19 [phone]

## 2019-09-01 NOTE — ED Triage Notes (Signed)
Patient states for a while he has not been sleeping well. States he "gets to hurting in his hip" and that makes him not sleep well and that "makes him nervous." Denies pain, N/V/D, SHOB, or other illness. States his "nerves are just about shot." Denies SI/HI. Patient is able to speak in complete sentences without difficulty.

## 2019-09-01 NOTE — ED Notes (Signed)
Of note, patient's wife passed away about a month ago and when asked if he has been depressed he states he thinks that is whats going on. Patient also states his appetite has not been very good since she passed away.

## 2019-09-02 DIAGNOSIS — F4323 Adjustment disorder with mixed anxiety and depressed mood: Secondary | ICD-10-CM

## 2019-09-02 MED ORDER — TRAZODONE HCL 50 MG PO TABS
50.0000 mg | ORAL_TABLET | Freq: Every day | ORAL | Status: DC
  Administered 2019-09-02: 01:00:00 50 mg via ORAL
  Filled 2019-09-02: qty 1

## 2019-09-02 MED ORDER — TRAZODONE HCL 50 MG PO TABS: 50.0000 mg | ORAL_TABLET | Freq: Every day | ORAL | 0 refills | Status: DC

## 2019-09-02 NOTE — ED Notes (Signed)
TTS at bedside. 

## 2019-09-02 NOTE — Consult Note (Signed)
[facility] Face-to-Face Psychiatry Consult   Reason for Consult:  Psych evaluation Referring Physician:  Dr. Owens Shark Patient Identification: [name] MRN:  [id number] Principal Diagnosis: Adjustment reaction with anxiety and depression Diagnosis:  Principal Problem:   Adjustment reaction with anxiety and depression   Total Time spent with patient: 45 minutes  Subjective:   [name] is a 78 y.o. male patient admitted with complaints of inability to sleep and anxiety. " Feels like Im going to shake all to pieces."  HPI: [name], 78 y.o., male patient seen face to face by this provider; chart reviewed and consulted with Dr. Owens Shark on 09/02/19.  On evaluation [name] reports  That has been having trouble sleeping and has been feeling anxious, like he going to "shake to death".  Report that his wife of 22 years died a month a go.  This is when he says the sleep issues began. "I think if I can get some rest, i'll feel better.  The pt denies SI, HI, and AVH. He is checked on daily by his grandson and a caretaker that comes in daily.  At this time pt does not to stay in the hospital and ahs contracted for safety.  Pt states he has no intention of hurting himself.    During evaluation [name] is laying in the hopsital bed; he is alert/oriented x 4; calm/cooperative; and mood congruent with affect.  Patient is speaking in a clear tone at moderate volume, and normal pace; with good eye contact.  His thought process is coherent and relevant; There is no indication that he is currently responding to internal/external stimuli or experiencing delusional thought content.  Patient denies suicidal/self-harm/homicidal ideation, psychosis, and paranoia.  Patient has remained calm throughout assessment and has answered questions appropriately.    HPI per EDP:   [name] is a 78 y.o. male with below list of previous medical conditions presents to the emergency department secondary to feelings of  anxiety with inability to sleep x1 month.  Patient states that his wife of 22 years died 65 month ago and ever since her death he has had difficulty sleeping with feelings of anxiety.  Patient denies any other symptoms.  Patient denies any suicidal ideation  Recommendation:  Discharge home.  The patient appears reasonably screened and/or stabilized for discharge and does not appear to have emergency medical/psychiatric concerns/conditions requiring further screening, evaluation, or treatment at this time prior to discharge.    Spoke with Dr. Owens Shark; informed of above recommendation and disposition  Past Psychiatric History: No    Risk to Self:  No Risk to Others:  No Prior Inpatient Therapy:  No Prior Outpatient Therapy:  No denies the use of therapy at this time.  Past Medical History:  Past Medical History:  Diagnosis Date  . Atypical chest pain    a. 05/2011 Neg MV; b. 04/2013 Neg MV; c. 04/2019 Echo: EF 55-60%, mild BAE. Degen MV dzs. Nl RV fxn; d. 06/2019 MV: EF 55%, small, mild, fixed apical and apical ant defect - probable artifact. No ischemia.  Marland Kitchen COPD (chronic obstructive pulmonary disease) ([place])   . Coronary artery calcification seen on CT scan    a. 07/2018 CT Chest - cor Ca2+.  . Diabetes mellitus without complication ([place])   . Erosive gastropathy    a. 06/2019 EGD  . GERD (gastroesophageal reflux disease)   . Hypertension   . Pulmonary nodules    a. 07/2018 Chest CT: RML and RLL nodules.  Marland Kitchen TIA (  transient ischemic attack)    History reviewed. No pertinent surgical history. Family History: No family history on file. Family Psychiatric  History: unknown Social History:  Social History   Substance and Sexual Activity  Alcohol Use No     Social History   Substance and Sexual Activity  Drug Use No    Social History   Socioeconomic History  . Marital status: Married    Spouse name: Not on file  . Number of children: Not on file  . Years of education: Not on file  .  Highest education level: Not on file  Occupational History  . Not on file  Tobacco Use  . Smoking status: Current Every Day Smoker    Packs/day: 0.50    Years: 60.00    Pack years: 30.00  . Smokeless tobacco: Never Used  . Tobacco comment: still smoking 1/2 ppd.  Substance and Sexual Activity  . Alcohol use: No  . Drug use: No  . Sexual activity: Not on file  Other Topics Concern  . Not on file  Social History Narrative   Lives locally by himself.  Children live locally but work and he says he is mostly alone.  Cont to smoke up to 1/2 ppd.  Does not routinely exercise.   Social Determinants of Health   Financial Resource Strain:   . Difficulty of Paying Living Expenses: Not on file  Food Insecurity:   . Worried About Charity fundraiser in the Last Year: Not on file  . Ran Out of Food in the Last Year: Not on file  Transportation Needs:   . Lack of Transportation (Medical): Not on file  . Lack of Transportation (Non-Medical): Not on file  Physical Activity:   . Days of Exercise per Week: Not on file  . Minutes of Exercise per Session: Not on file  Stress:   . Feeling of Stress : Not on file  Social Connections:   . Frequency of Communication with Friends and Family: Not on file  . Frequency of Social Gatherings with Friends and Family: Not on file  . Attends Religious Services: Not on file  . Active Member of Clubs or Organizations: Not on file  . Attends Archivist Meetings: Not on file  . Marital Status: Not on file   Additional Social History:    Allergies:   Allergies  Allergen Reactions  . Codeine   . Ivp Dye [Iodinated Diagnostic Agents] Hives and Other (See Comments)    dizzy  . Percocet [Oxycodone-Acetaminophen] Hives and Nausea Only    Labs:  Results for orders placed or performed during the hospital encounter of 09/01/19 (from the past 48 hour(s))  Comprehensive metabolic panel     Status: Abnormal   Collection Time: 09/01/19  7:31 PM   Result Value Ref Range   Sodium 132 (L) 135 - 145 mmol/L   Potassium 3.2 (L) 3.5 - 5.1 mmol/L   Chloride 95 (L) 98 - 111 mmol/L   CO2 23 22 - 32 mmol/L   Glucose, Bld 142 (H) 70 - 99 mg/dL   BUN 23 8 - 23 mg/dL   Creatinine, Ser 0.94 0.61 - 1.24 mg/dL   Calcium 8.4 (L) 8.9 - 10.3 mg/dL   Total Protein 6.8 6.5 - 8.1 g/dL   Albumin 3.6 3.5 - 5.0 g/dL   AST 25 15 - 41 U/L   ALT 15 0 - 44 U/L   Alkaline Phosphatase 68 38 - 126 U/L   Total  Bilirubin 0.4 0.3 - 1.2 mg/dL   GFR calc non Af Amer >60 >60 mL/min   GFR calc Af Amer >60 >60 mL/min   Anion gap 14 5 - 15    Comment: Performed at [facility], [address] [place], [place] [address]  CBC     Status: Abnormal   Collection Time: 09/01/19  7:31 PM  Result Value Ref Range   WBC 9.9 4.0 - 10.5 K/uL   RBC 4.44 4.22 - 5.81 MIL/uL   Hemoglobin 10.5 (L) 13.0 - 17.0 g/dL   HCT 31.9 (L) 39.0 - 52.0 %   MCV 71.8 (L) 80.0 - 100.0 fL   MCH 23.6 (L) 26.0 - 34.0 pg   MCHC 32.9 30.0 - 36.0 g/dL   RDW 16.6 (H) 11.5 - 15.5 %   Platelets 189 150 - 400 K/uL   nRBC 0.0 0.0 - 0.2 %    Comment: Performed at [facility], [address], [place], [place] [address]    Current Facility-Administered Medications  Medication Dose Route Frequency Provider Last Rate Last Admin  . traZODone (DESYREL) tablet 50 mg  50 mg Oral QHS Gregor Hams, MD       Current Outpatient Medications  Medication Sig Dispense Refill  . albuterol (PROAIR HFA) 108 (90 Base) MCG/ACT inhaler Inhale 2 puffs every 6 (six) hours as needed into the lungs.    Marland Kitchen amLODipine (NORVASC) 10 MG tablet Take 1 tablet (10 mg total) by mouth daily. 30 tablet 0  . aspirin EC 81 MG tablet Take 81 mg daily by mouth.    . budesonide-formoterol (SYMBICORT) 160-4.5 MCG/ACT inhaler Inhale 2 puffs into the lungs 2 (two) times daily.    . cyclobenzaprine (FLEXERIL) 5 MG tablet Take 1 tablet (5 mg total) by mouth 3 (three) times daily. (Patient not taking: Reported on  08/29/2019) 30 tablet 0  . feeding supplement, ENSURE ENLIVE, (ENSURE ENLIVE) LIQD Take 237 mLs by mouth 2 (two) times daily between meals. (Patient not taking: Reported on 07/27/2017) 237 mL 12  . gabapentin (NEURONTIN) 100 MG capsule Take 100 mg by mouth at bedtime.    Marland Kitchen lisinopril (PRINIVIL,ZESTRIL) 20 MG tablet Take 20 mg by mouth daily.    Marland Kitchen lovastatin (MEVACOR) 40 MG tablet Take 40 mg by mouth at bedtime.    Marland Kitchen MELATONIN PO Take 5 mg by mouth at bedtime as needed.    . metFORMIN (GLUCOPHAGE) 850 MG tablet Take 850 mg by mouth 2 (two) times daily with a meal.     . metoprolol succinate (TOPROL-XL) 25 MG 24 hr tablet Take 12.5 mg by mouth daily.    Marland Kitchen omeprazole (PRILOSEC) 20 MG capsule Take 40 mg by mouth 2 (two) times daily before a meal.     . tiotropium (SPIRIVA) 18 MCG inhalation capsule Place 1 capsule into inhaler and inhale daily.    . traZODone (DESYREL) 50 MG tablet Take 1 tablet (50 mg total) by mouth at bedtime. 3 tablet 0    Musculoskeletal: Strength & Muscle Tone: within normal limits Gait & Station: normal Patient leans: N/A  Psychiatric Specialty Exam: Physical Exam  Constitutional: He is oriented to person, place, and time. He appears well-developed.  HENT:  Head: Normocephalic.  Eyes: Pupils are equal, round, and reactive to light.  Respiratory: Effort normal.  Musculoskeletal:     Cervical back: Normal range of motion.  Neurological: He is alert and oriented to person, place, and time.  Skin: Skin is warm and  dry.  Psychiatric: His speech is normal and behavior is normal. Judgment and thought content normal. His mood appears anxious. Cognition and memory are normal. He exhibits a depressed mood.    Review of Systems  Psychiatric/Behavioral: Positive for sleep disturbance. Negative for suicidal ideas. The patient is nervous/anxious.   All other systems reviewed and are negative.   Blood pressure (!) 162/77, pulse (!) 106, temperature 98.6 F (37 C), temperature  source Oral, resp. rate 18, height 5\' 7"  (1.702 m), weight 66.7 kg, SpO2 96 %.Body mass index is 23.02 kg/m.  General Appearance: Casual  Eye Contact:  Good  Speech:  Normal Rate  Volume:  Normal  Mood:  Euthymic  Affect:  Congruent  Thought Process:  Coherent and Descriptions of Associations: Intact  Orientation:  Full (Time, Place, and Person)  Thought Content:  Logical  Suicidal Thoughts:  No  Homicidal Thoughts:  No  Memory:  Immediate;   Good  Judgement:  Good  Insight:  Good  Psychomotor Activity:  Normal  Concentration:  Concentration: Good  Recall:  Good  Fund of Knowledge:  Good  Language:  Good  Akathisia:  NA  Handed:  Right  AIMS (if indicated):     Assets:  Communication Skills Desire for Improvement Physical Health  ADL's:  Intact  Cognition:  WNL  Sleep:   Not currently sleeping     Treatment Plan Summary: Plan Discharge home and prescribeTrazodone 25mg  at bedtime for sleep  Disposition: No evidence of imminent risk to self or others at present.   Patient does not meet criteria for psychiatric inpatient admission. Supportive therapy provided about ongoing stressors. Discussed crisis plan, support from social network, calling 911, coming to the Emergency Department, and calling Suicide Hotline.  Deloria Lair, NP 09/02/2019 12:51 AM

## 2019-09-04 DIAGNOSIS — F5102 Adjustment insomnia: Secondary | ICD-10-CM | POA: Diagnosis not present

## 2019-09-06 DIAGNOSIS — Z122 Encounter for screening for malignant neoplasm of respiratory organs: Secondary | ICD-10-CM | POA: Diagnosis not present

## 2019-09-06 DIAGNOSIS — K219 Gastro-esophageal reflux disease without esophagitis: Secondary | ICD-10-CM | POA: Diagnosis not present

## 2019-09-06 DIAGNOSIS — K769 Liver disease, unspecified: Secondary | ICD-10-CM | POA: Diagnosis not present

## 2019-09-06 DIAGNOSIS — E042 Nontoxic multinodular goiter: Secondary | ICD-10-CM | POA: Diagnosis not present

## 2019-09-06 DIAGNOSIS — Z6379 Other stressful life events affecting family and household: Secondary | ICD-10-CM | POA: Diagnosis not present

## 2019-09-06 DIAGNOSIS — M48061 Spinal stenosis, lumbar region without neurogenic claudication: Secondary | ICD-10-CM | POA: Diagnosis not present

## 2019-09-06 DIAGNOSIS — Z87891 Personal history of nicotine dependence: Secondary | ICD-10-CM | POA: Diagnosis not present

## 2019-09-06 DIAGNOSIS — R1084 Generalized abdominal pain: Secondary | ICD-10-CM | POA: Diagnosis not present

## 2019-09-08 DIAGNOSIS — F4321 Adjustment disorder with depressed mood: Secondary | ICD-10-CM | POA: Insufficient documentation

## 2019-09-11 ENCOUNTER — Ambulatory Visit: Payer: Self-pay

## 2019-09-11 DIAGNOSIS — R110 Nausea: Secondary | ICD-10-CM | POA: Diagnosis not present

## 2019-09-11 DIAGNOSIS — I2510 Atherosclerotic heart disease of native coronary artery without angina pectoris: Secondary | ICD-10-CM | POA: Diagnosis not present

## 2019-09-11 DIAGNOSIS — R000 Tachycardia, unspecified: Secondary | ICD-10-CM | POA: Diagnosis not present

## 2019-09-11 DIAGNOSIS — N182 Chronic kidney disease, stage 2 (mild): Secondary | ICD-10-CM | POA: Diagnosis not present

## 2019-09-11 DIAGNOSIS — J449 Chronic obstructive pulmonary disease, unspecified: Secondary | ICD-10-CM | POA: Diagnosis not present

## 2019-09-11 DIAGNOSIS — Z91041 Radiographic dye allergy status: Secondary | ICD-10-CM | POA: Diagnosis not present

## 2019-09-11 DIAGNOSIS — R109 Unspecified abdominal pain: Secondary | ICD-10-CM | POA: Diagnosis not present

## 2019-09-11 DIAGNOSIS — I129 Hypertensive chronic kidney disease with stage 1 through stage 4 chronic kidney disease, or unspecified chronic kidney disease: Secondary | ICD-10-CM | POA: Diagnosis not present

## 2019-09-11 DIAGNOSIS — R1013 Epigastric pain: Secondary | ICD-10-CM | POA: Diagnosis not present

## 2019-09-11 DIAGNOSIS — Z86010 Personal history of colonic polyps: Secondary | ICD-10-CM | POA: Diagnosis not present

## 2019-09-11 DIAGNOSIS — E785 Hyperlipidemia, unspecified: Secondary | ICD-10-CM | POA: Diagnosis not present

## 2019-09-11 DIAGNOSIS — Z8673 Personal history of transient ischemic attack (TIA), and cerebral infarction without residual deficits: Secondary | ICD-10-CM | POA: Diagnosis not present

## 2019-09-11 DIAGNOSIS — Z885 Allergy status to narcotic agent status: Secondary | ICD-10-CM | POA: Diagnosis not present

## 2019-09-11 DIAGNOSIS — I719 Aortic aneurysm of unspecified site, without rupture: Secondary | ICD-10-CM | POA: Diagnosis not present

## 2019-09-11 DIAGNOSIS — E1122 Type 2 diabetes mellitus with diabetic chronic kidney disease: Secondary | ICD-10-CM | POA: Diagnosis not present

## 2019-09-11 DIAGNOSIS — Z7982 Long term (current) use of aspirin: Secondary | ICD-10-CM | POA: Diagnosis not present

## 2019-10-11 DIAGNOSIS — F5102 Adjustment insomnia: Secondary | ICD-10-CM | POA: Diagnosis not present

## 2019-10-11 DIAGNOSIS — I10 Essential (primary) hypertension: Secondary | ICD-10-CM | POA: Diagnosis not present

## 2019-10-11 DIAGNOSIS — E1122 Type 2 diabetes mellitus with diabetic chronic kidney disease: Secondary | ICD-10-CM | POA: Diagnosis not present

## 2019-10-11 DIAGNOSIS — J449 Chronic obstructive pulmonary disease, unspecified: Secondary | ICD-10-CM | POA: Diagnosis not present

## 2019-12-09 DIAGNOSIS — J449 Chronic obstructive pulmonary disease, unspecified: Secondary | ICD-10-CM | POA: Diagnosis not present

## 2019-12-09 DIAGNOSIS — M5432 Sciatica, left side: Secondary | ICD-10-CM | POA: Diagnosis not present

## 2020-01-06 ENCOUNTER — Encounter: Payer: Self-pay | Admitting: Emergency Medicine

## 2020-01-06 ENCOUNTER — Emergency Department
Admission: EM | Admit: 2020-01-06 | Discharge: 2020-01-07 | Disposition: A | Discharge status: Home / Self Care | Payer: Medicare Other | Attending: Emergency Medicine | Admitting: Emergency Medicine

## 2020-01-06 ENCOUNTER — Other Ambulatory Visit: Payer: Self-pay

## 2020-01-06 ENCOUNTER — Emergency Department: Payer: Medicare Other

## 2020-01-06 DIAGNOSIS — Z8673 Personal history of transient ischemic attack (TIA), and cerebral infarction without residual deficits: Secondary | ICD-10-CM | POA: Diagnosis not present

## 2020-01-06 DIAGNOSIS — I2510 Atherosclerotic heart disease of native coronary artery without angina pectoris: Secondary | ICD-10-CM | POA: Insufficient documentation

## 2020-01-06 DIAGNOSIS — Z79899 Other long term (current) drug therapy: Secondary | ICD-10-CM | POA: Diagnosis not present

## 2020-01-06 DIAGNOSIS — J449 Chronic obstructive pulmonary disease, unspecified: Secondary | ICD-10-CM | POA: Insufficient documentation

## 2020-01-06 DIAGNOSIS — R42 Dizziness and giddiness: Secondary | ICD-10-CM | POA: Insufficient documentation

## 2020-01-06 DIAGNOSIS — I10 Essential (primary) hypertension: Secondary | ICD-10-CM | POA: Insufficient documentation

## 2020-01-06 DIAGNOSIS — E119 Type 2 diabetes mellitus without complications: Secondary | ICD-10-CM | POA: Insufficient documentation

## 2020-01-06 DIAGNOSIS — E860 Dehydration: Secondary | ICD-10-CM | POA: Diagnosis not present

## 2020-01-06 DIAGNOSIS — F17210 Nicotine dependence, cigarettes, uncomplicated: Secondary | ICD-10-CM | POA: Insufficient documentation

## 2020-01-06 DIAGNOSIS — Z7982 Long term (current) use of aspirin: Secondary | ICD-10-CM | POA: Insufficient documentation

## 2020-01-06 DIAGNOSIS — Z7984 Long term (current) use of oral hypoglycemic drugs: Secondary | ICD-10-CM | POA: Insufficient documentation

## 2020-01-06 DIAGNOSIS — R0602 Shortness of breath: Secondary | ICD-10-CM | POA: Diagnosis not present

## 2020-01-06 LAB — CBC
HCT: 30.2 % — ABNORMAL LOW (ref 39.0–52.0)
Hemoglobin: 9.1 g/dL — ABNORMAL LOW (ref 13.0–17.0)
MCH: 22 pg — ABNORMAL LOW (ref 26.0–34.0)
MCHC: 30.1 g/dL (ref 30.0–36.0)
MCV: 73.1 fL — ABNORMAL LOW (ref 80.0–100.0)
Platelets: 234 10*3/uL (ref 150–400)
RBC: 4.13 MIL/uL — ABNORMAL LOW (ref 4.22–5.81)
RDW: 17.1 % — ABNORMAL HIGH (ref 11.5–15.5)
WBC: 6.7 10*3/uL (ref 4.0–10.5)
nRBC: 0 % (ref 0.0–0.2)

## 2020-01-06 LAB — BASIC METABOLIC PANEL
Anion gap: 6 (ref 5–15)
BUN: 18 mg/dL (ref 8–23)
CO2: 27 mmol/L (ref 22–32)
Calcium: 8.6 mg/dL — ABNORMAL LOW (ref 8.9–10.3)
Chloride: 100 mmol/L (ref 98–111)
Creatinine, Ser: 1.1 mg/dL (ref 0.61–1.24)
GFR calc Af Amer: >60 mL/min (ref >60)
GFR calc non Af Amer: >60 mL/min (ref >60)
Glucose, Bld: 133 mg/dL — ABNORMAL HIGH (ref 70–99)
Potassium: 3.8 mmol/L (ref 3.5–5.1)
Sodium: 133 mmol/L — ABNORMAL LOW (ref 135–145)

## 2020-01-06 LAB — URINALYSIS, COMPLETE (UACMP) WITH MICROSCOPIC
Bacteria, UA: NONE SEEN
Bilirubin Urine: NEGATIVE
Glucose, UA: NEGATIVE mg/dL
Hgb urine dipstick: NEGATIVE
Ketones, ur: NEGATIVE mg/dL
Leukocytes,Ua: NEGATIVE
Nitrite: NEGATIVE
Protein, ur: NEGATIVE mg/dL
Specific Gravity, Urine: 1.02 (ref 1.005–1.030)
pH: 6 (ref 5.0–8.0)

## 2020-01-06 LAB — GLUCOSE, CAPILLARY: Glucose-Capillary: 120 mg/dL — ABNORMAL HIGH (ref 70–99)

## 2020-01-06 LAB — TROPONIN I (HIGH SENSITIVITY): Troponin I (High Sensitivity): 53 ng/L — ABNORMAL HIGH (ref <18)

## 2020-01-06 MED ORDER — TRAMADOL HCL 50 MG PO TABS
50.0000 mg | ORAL_TABLET | Freq: Once | ORAL | Status: AC
  Administered 2020-01-06: 50 mg via ORAL
  Filled 2020-01-06: qty 1

## 2020-01-06 MED ORDER — SODIUM CHLORIDE 0.9% FLUSH
3.0000 mL | Freq: Once | INTRAVENOUS | Status: AC
  Administered 2020-01-06: 3 mL via INTRAVENOUS

## 2020-01-06 MED ORDER — IPRATROPIUM-ALBUTEROL 0.5-2.5 (3) MG/3ML IN SOLN
3.0000 mL | Freq: Once | RESPIRATORY_TRACT | Status: AC
  Administered 2020-01-06: 3 mL via RESPIRATORY_TRACT
  Filled 2020-01-06: qty 3

## 2020-01-06 MED ORDER — ONDANSETRON 4 MG PO TBDP
4.0000 mg | ORAL_TABLET | Freq: Once | ORAL | Status: AC
  Administered 2020-01-06: 4 mg via ORAL
  Filled 2020-01-06: qty 1

## 2020-01-06 MED ORDER — HYDROCODONE-ACETAMINOPHEN 5-325 MG PO TABS: 1.0000 | ORAL_TABLET | Freq: Once | ORAL | Status: DC

## 2020-01-06 NOTE — ED Triage Notes (Signed)
Pt presents to ED via POV with c/o SOB and dizziness. Pt states "all of a sudden I felt like I got a short of breath, real dizzy and like my stomach was real full of air". Pt endorses feeling like he is going to pass out of at this time. VSS upon arrival to ED.

## 2020-01-06 NOTE — ED Provider Notes (Signed)
[facility] Emergency Department Provider Note  Time seen: 9:42 PM  I have reviewed the triage vital signs and the nursing notes.   HISTORY  Chief Complaint Shortness of Breath and Dizziness   HPI [name] is a 79 y.o. male with a past medical history of COPD, diabetes, hypertension, presents to the emergency department for shortness of breath.  According to the patient he has been experiencing some shortness of breath for some time which she states weeks or months but states it was worse today.  Patient states he is feeling short of breath and feeling like he was going to pass out so he came to the emergency department.  Patient also states some dizziness which he describes as a sensation of felt like he was going to pass out.  States this has been an ongoing issue as well but felt worse tonight.  Patient states since arriving to the emergency department the patient feels much better.  Denies any pain currently.  States mild shortness of breath.  Denies any fever.  Largely negative review of systems otherwise.   Past Medical History:  Diagnosis Date  . Atypical chest pain    a. 05/2011 Neg MV; b. 04/2013 Neg MV; c. 04/2019 Echo: EF 55-60%, mild BAE. Degen MV dzs. Nl RV fxn; d. 06/2019 MV: EF 55%, small, mild, fixed apical and apical ant defect - probable artifact. No ischemia.  Marland Kitchen COPD (chronic obstructive pulmonary disease) ([place])   . Coronary artery calcification seen on CT scan    a. 07/2018 CT Chest - cor Ca2+.  . Diabetes mellitus without complication ([place])   . Erosive gastropathy    a. 06/2019 EGD  . GERD (gastroesophageal reflux disease)   . Hypertension   . Pulmonary nodules    a. 07/2018 Chest CT: RML and RLL nodules.  Marland Kitchen TIA (transient ischemic attack)     Patient Active Problem List   Diagnosis Date Noted  . Adjustment reaction with anxiety and depression 09/02/2019  . Chest pain with high risk of acute coronary syndrome 08/29/2019  . Near syncope  05/28/2016  . COPD exacerbation ([place]) 05/27/2016    History reviewed. No pertinent surgical history.  Prior to Admission medications   Medication Sig Start Date End Date Taking? Authorizing Provider  albuterol (PROAIR HFA) 108 (90 Base) MCG/ACT inhaler Inhale 2 puffs every 6 (six) hours as needed into the lungs. 12/07/15   [provider]  amLODipine (NORVASC) 10 MG tablet Take 1 tablet (10 mg total) by mouth daily. 05/30/16   Hower, Aaron Mose, MD  aspirin EC 81 MG tablet Take 81 mg daily by mouth. 11/16/06   [provider]  budesonide-formoterol (SYMBICORT) 160-4.5 MCG/ACT inhaler Inhale 2 puffs into the lungs 2 (two) times daily.    [provider]  cyclobenzaprine (FLEXERIL) 5 MG tablet Take 1 tablet (5 mg total) by mouth 3 (three) times daily. Patient not taking: Reported on 08/29/2019 05/30/16   Hower, Aaron Mose, MD  feeding supplement, ENSURE ENLIVE, (ENSURE ENLIVE) LIQD Take 237 mLs by mouth 2 (two) times daily between meals. Patient not taking: Reported on 07/27/2017 05/30/16   Hower, Aaron Mose, MD  gabapentin (NEURONTIN) 100 MG capsule Take 100 mg by mouth at bedtime. 08/06/19   [provider]  lisinopril (PRINIVIL,ZESTRIL) 20 MG tablet Take 20 mg by mouth daily.    [provider]  lovastatin (MEVACOR) 40 MG tablet Take 40 mg by mouth at bedtime.    [provider]  MELATONIN PO Take 5 mg by mouth at bedtime as needed.    [provider]  metFORMIN (GLUCOPHAGE) 850 MG tablet Take 850 mg by mouth 2 (two) times daily with a meal.     [provider]  metoprolol succinate (TOPROL-XL) 25 MG 24 hr tablet Take 12.5 mg by mouth daily. 08/02/19   [provider]  omeprazole (PRILOSEC) 20 MG capsule Take 40 mg by mouth 2 (two) times daily before a meal.     [provider]  tiotropium (SPIRIVA) 18 MCG inhalation capsule Place 1 capsule into inhaler and inhale daily. 12/07/15   [provider]  traZODone  (DESYREL) 50 MG tablet Take 1 tablet (50 mg total) by mouth at bedtime. 09/02/19   Gregor Hams, MD    Allergies  Allergen Reactions  . Codeine   . Ivp Dye [Iodinated Diagnostic Agents] Hives and Other (See Comments)    dizzy  . Percocet [Oxycodone-Acetaminophen] Hives and Nausea Only    History reviewed. No pertinent family history.  Social History Social History   Tobacco Use  . Smoking status: Current Every Day Smoker    Packs/day: 0.50    Years: 60.00    Pack years: 30.00  . Smokeless tobacco: Never Used  . Tobacco comment: still smoking 1/2 ppd.  Substance Use Topics  . Alcohol use: No  . Drug use: No    Review of Systems Constitutional: Negative for fever.  Intermittent dizziness. Cardiovascular: Negative for chest pain. Respiratory: Positive for shortness of breath. Gastrointestinal: Negative for abdominal pain Musculoskeletal: Negative for leg pain or swelling. Neurological: Negative for headache All other ROS negative  ____________________________________________   PHYSICAL EXAM:  VITAL SIGNS: ED Triage Vitals [01/06/20 1804]  Enc Vitals Group     BP (!) 144/71     Pulse Rate 87     Resp 20     Temp 98.2 F (36.8 C)     Temp Source Oral     SpO2 96 %     Weight 147 lb (66.7 kg)     Height 5\' 7"  (1.702 m)     Head Circumference      Peak Flow      Pain Score 0     Pain Loc      Pain Edu?      Excl. in [place]?    Constitutional: Alert and oriented. Well appearing and in no distress. Eyes: Normal exam ENT      Head: Normocephalic and atraumatic.      Mouth/Throat: Mucous membranes are moist. Cardiovascular: Normal rate, regular rhythm. Respiratory: Normal respiratory effort without tachypnea nor retractions. Breath sounds are clear  Gastrointestinal: Soft and nontender. No distention.   Musculoskeletal: Nontender with normal range of motion in all extremities.  No lower extremity edema or tenderness. Neurologic:  Normal speech and language.  No gross focal neurologic deficits Skin:  Skin is warm, dry and intact.  Psychiatric: Mood and affect are normal.   ____________________________________________    EKG  EKG viewed and interpreted by myself shows a normal sinus rhythm 89 bpm with a narrow QRS, normal axis, normal intervals, nonspecific ST changes.  ____________________________________________    RADIOLOGY  Chest x-ray is negative  ____________________________________________   INITIAL IMPRESSION / ASSESSMENT AND PLAN / ED COURSE  Pertinent labs & imaging results that were available during my care of the patient were reviewed by me and considered in my medical decision making (see chart for details).   Patient presents to  the emergency department for an intermittent dizziness and shortness of breath.  States these issues are ongoing but worse today.  Patient states since waiting in the waiting room he actually feels much better denies any shortness of breath or dizziness currently.  Denies any chest pain at any point.  Overall the patient appears well.  Chest x-ray is clear.  Overall clear lung sounds, however given history of COPD patient states improvement previously with breathing treatments we will dose a breathing treatment.  Patient's troponin is elevated to 53 however this is decreased from last known lab value.  Denies any chest pain.  We will repeat a troponin to ensure no significant change.  We will continue to closely monitor while awaiting results.  Overall the patient appears well with a reassuring physical exam.  [name] was evaluated in Emergency Department on 01/06/2020 for the symptoms described in the history of present illness. He was evaluated in the context of the global COVID-19 pandemic, which necessitated consideration that the patient might be at risk for infection with the SARS-CoV-2 virus that causes COVID-19. Institutional protocols and algorithms that pertain to the evaluation of patients at  risk for COVID-19 are in a state of rapid change based on information released by regulatory bodies including the CDC and federal and state organizations. These policies and algorithms were followed during the patient's care in the ED.  ____________________________________________   FINAL CLINICAL IMPRESSION(S) / ED DIAGNOSES  Dizziness Dyspnea   Harvest Cantave, MD 01/09/20 2035

## 2020-01-07 ENCOUNTER — Other Ambulatory Visit: Payer: Self-pay

## 2020-01-07 ENCOUNTER — Emergency Department
Admission: EM | Admit: 2020-01-07 | Discharge: 2020-01-07 | Disposition: A | Discharge status: Home / Self Care | Payer: Medicare Other | Source: Home / Self Care | Attending: Emergency Medicine | Admitting: Emergency Medicine

## 2020-01-07 ENCOUNTER — Emergency Department: Payer: Medicare Other

## 2020-01-07 DIAGNOSIS — Z743 Need for continuous supervision: Secondary | ICD-10-CM | POA: Diagnosis not present

## 2020-01-07 DIAGNOSIS — R0602 Shortness of breath: Secondary | ICD-10-CM | POA: Diagnosis not present

## 2020-01-07 DIAGNOSIS — R42 Dizziness and giddiness: Secondary | ICD-10-CM | POA: Diagnosis not present

## 2020-01-07 DIAGNOSIS — E860 Dehydration: Secondary | ICD-10-CM

## 2020-01-07 DIAGNOSIS — R531 Weakness: Secondary | ICD-10-CM | POA: Diagnosis not present

## 2020-01-07 LAB — BASIC METABOLIC PANEL
Anion gap: 9 (ref 5–15)
BUN: 16 mg/dL (ref 8–23)
CO2: 26 mmol/L (ref 22–32)
Calcium: 8.8 mg/dL — ABNORMAL LOW (ref 8.9–10.3)
Chloride: 99 mmol/L (ref 98–111)
Creatinine, Ser: 1.07 mg/dL (ref 0.61–1.24)
GFR calc Af Amer: >60 mL/min (ref >60)
GFR calc non Af Amer: >60 mL/min (ref >60)
Glucose, Bld: 107 mg/dL — ABNORMAL HIGH (ref 70–99)
Potassium: 4.1 mmol/L (ref 3.5–5.1)
Sodium: 134 mmol/L — ABNORMAL LOW (ref 135–145)

## 2020-01-07 LAB — CBC
HCT: 32.3 % — ABNORMAL LOW (ref 39.0–52.0)
Hemoglobin: 9.8 g/dL — ABNORMAL LOW (ref 13.0–17.0)
MCH: 21.9 pg — ABNORMAL LOW (ref 26.0–34.0)
MCHC: 30.3 g/dL (ref 30.0–36.0)
MCV: 72.1 fL — ABNORMAL LOW (ref 80.0–100.0)
Platelets: 245 10*3/uL (ref 150–400)
RBC: 4.48 MIL/uL (ref 4.22–5.81)
RDW: 17.3 % — ABNORMAL HIGH (ref 11.5–15.5)
WBC: 6.3 10*3/uL (ref 4.0–10.5)
nRBC: 0 % (ref 0.0–0.2)

## 2020-01-07 LAB — TROPONIN I (HIGH SENSITIVITY)
Troponin I (High Sensitivity): 55 ng/L — ABNORMAL HIGH (ref <18)
Troponin I (High Sensitivity): 52 ng/L — ABNORMAL HIGH (ref <18)

## 2020-01-07 MED ORDER — ONDANSETRON 4 MG PO TBDP
8.0000 mg | ORAL_TABLET | Freq: Once | ORAL | Status: AC
  Administered 2020-01-07: 8 mg via ORAL
  Filled 2020-01-07: qty 2

## 2020-01-07 MED ORDER — ONDANSETRON 4 MG PO TBDP: 4.0000 mg | ORAL_TABLET | Freq: Three times a day (TID) | ORAL | 0 refills | Status: AC | PRN

## 2020-01-07 MED ORDER — SODIUM CHLORIDE 0.9% FLUSH: 3.0000 mL | Freq: Once | INTRAVENOUS | Status: DC

## 2020-01-07 NOTE — Discharge Instructions (Addendum)
Return to the ER if you feel dizzy, if you pass out, if you have chest pain or shortness of breath. Otherwise follow up with your doctor on your appointmen in the next few days.

## 2020-01-07 NOTE — ED Provider Notes (Addendum)
[facility] Emergency Department Provider Note  ____________________________________________  Time seen: Approximately 2:03 PM  I have reviewed the triage vital signs and the nursing notes.   HISTORY  Chief Complaint Dizziness    HPI [name] is a 79 y.o. male with a history of COPD CAD diabetes hypertension who comes the ED complaining of nausea, abdominal "tightness,", decreased oral intake, and dizziness described as feeling weak and unsteady.  These are chronic issues for him but worse in the last 24 hours.  He notes that he came to the ED last night with shortness of breath, given a breathing treatment and felt better.  However, this morning he feels worse again.  He has not eaten recently but does feel hungry.  He denies abdominal pain vomiting diarrhea constipation.  Symptoms are intermittent, waxing and waning, no aggravating or alleviating factors.  No chest pain or shortness of breath currently.      Past Medical History:  Diagnosis Date  . Atypical chest pain    a. 05/2011 Neg MV; b. 04/2013 Neg MV; c. 04/2019 Echo: EF 55-60%, mild BAE. Degen MV dzs. Nl RV fxn; d. 06/2019 MV: EF 55%, small, mild, fixed apical and apical ant defect - probable artifact. No ischemia.  Marland Kitchen COPD (chronic obstructive pulmonary disease) ([place])   . Coronary artery calcification seen on CT scan    a. 07/2018 CT Chest - cor Ca2+.  . Diabetes mellitus without complication ([place])   . Erosive gastropathy    a. 06/2019 EGD  . GERD (gastroesophageal reflux disease)   . Hypertension   . Pulmonary nodules    a. 07/2018 Chest CT: RML and RLL nodules.  Marland Kitchen TIA (transient ischemic attack)      Patient Active Problem List   Diagnosis Date Noted  . Adjustment reaction with anxiety and depression 09/02/2019  . Chest pain with high risk of acute coronary syndrome 08/29/2019  . Near syncope 05/28/2016  . COPD exacerbation ([place]) 05/27/2016     History reviewed. No pertinent  surgical history.   Prior to Admission medications   Medication Sig Start Date End Date Taking? Authorizing Provider  albuterol (PROAIR HFA) 108 (90 Base) MCG/ACT inhaler Inhale 2 puffs every 6 (six) hours as needed into the lungs. 12/07/15   [provider]  amLODipine (NORVASC) 10 MG tablet Take 1 tablet (10 mg total) by mouth daily. 05/30/16   Hower, Aaron Mose, MD  aspirin EC 81 MG tablet Take 81 mg daily by mouth. 11/16/06   [provider]  budesonide-formoterol (SYMBICORT) 160-4.5 MCG/ACT inhaler Inhale 2 puffs into the lungs 2 (two) times daily.    [provider]  cyclobenzaprine (FLEXERIL) 5 MG tablet Take 1 tablet (5 mg total) by mouth 3 (three) times daily. Patient not taking: Reported on 08/29/2019 05/30/16   Hower, Aaron Mose, MD  feeding supplement, ENSURE ENLIVE, (ENSURE ENLIVE) LIQD Take 237 mLs by mouth 2 (two) times daily between meals. Patient not taking: Reported on 07/27/2017 05/30/16   Hower, Aaron Mose, MD  gabapentin (NEURONTIN) 100 MG capsule Take 100 mg by mouth at bedtime. 08/06/19   [provider]  lisinopril (PRINIVIL,ZESTRIL) 20 MG tablet Take 20 mg by mouth daily.    [provider]  lovastatin (MEVACOR) 40 MG tablet Take 40 mg by mouth at bedtime.    [provider]  MELATONIN PO Take 5 mg by mouth at bedtime as needed.    [provider]  metFORMIN (GLUCOPHAGE) 850 MG tablet Take  850 mg by mouth 2 (two) times daily with a meal.     [provider]  metoprolol succinate (TOPROL-XL) 25 MG 24 hr tablet Take 12.5 mg by mouth daily. 08/02/19   [provider]  omeprazole (PRILOSEC) 20 MG capsule Take 40 mg by mouth 2 (two) times daily before a meal.     [provider]  tiotropium (SPIRIVA) 18 MCG inhalation capsule Place 1 capsule into inhaler and inhale daily. 12/07/15   [provider]  traZODone (DESYREL) 50 MG tablet Take 1 tablet (50 mg total) by mouth at bedtime. 09/02/19    Gregor Hams, MD     Allergies Codeine, Ivp dye [iodinated diagnostic agents], and Percocet [oxycodone-acetaminophen]   No family history on file.  Social History Social History   Tobacco Use  . Smoking status: Current Every Day Smoker    Packs/day: 0.50    Years: 60.00    Pack years: 30.00  . Smokeless tobacco: Never Used  . Tobacco comment: still smoking 1/2 ppd.  Substance Use Topics  . Alcohol use: No  . Drug use: No    Review of Systems  Constitutional:   No fever or chills.  ENT:   No sore throat. No rhinorrhea. Cardiovascular:   No chest pain or syncope. Respiratory:   No dyspnea or cough. Gastrointestinal:   Negative for abdominal pain, vomiting and diarrhea.  Musculoskeletal:   Negative for focal pain or swelling All other systems reviewed and are negative except as documented above in ROS and HPI.  ____________________________________________   PHYSICAL EXAM:  VITAL SIGNS: ED Triage Vitals  Enc Vitals Group     BP 01/07/20 1203 132/76     Pulse Rate 01/07/20 1203 68     Resp 01/07/20 1203 16     Temp 01/07/20 1203 97.7 F (36.5 C)     Temp Source 01/07/20 1203 Oral     SpO2 01/07/20 1203 97 %     Weight 01/07/20 1115 147 lb (66.7 kg)     Height 01/07/20 1115 5\' 7"  (1.702 m)     Head Circumference --      Peak Flow --      Pain Score 01/07/20 1114 4     Pain Loc --      Pain Edu? --      Excl. in [place]? --     Vital signs reviewed, nursing assessments reviewed.   Constitutional:   Alert and oriented. Non-toxic appearance. Eyes:   Conjunctivae are normal. EOMI. PERRL. ENT      Head:   Normocephalic and atraumatic.      Nose:   Wearing a mask.      Mouth/Throat:   Wearing a mask.      Neck:   No meningismus. Full ROM. Hematological/Lymphatic/Immunilogical:   No cervical lymphadenopathy. Cardiovascular:   RRR. Symmetric bilateral radial and DP pulses.  No murmurs. Cap refill less than 2 seconds. Respiratory:   Normal respiratory effort  without tachypnea/retractions. Breath sounds are clear and equal bilaterally. No wheezes/rales/rhonchi. Gastrointestinal:   Soft and nontender. Non distended. There is no CVA tenderness.  No rebound, rigidity, or guarding. Musculoskeletal:   Normal range of motion in all extremities. No joint effusions.  No lower extremity tenderness.  No edema. Neurologic:   Normal speech and language.  Cranial nerves III through XII intact Motor grossly intact.  No drift in all 4 extremities Cerebellar function intact No acute focal neurologic deficits are appreciated.  NIH stroke scale  0 Skin:    Skin is warm, dry and intact. No rash noted.  No petechiae, purpura, or bullae.  ____________________________________________    LABS (pertinent positives/negatives) (all labs ordered are listed, but only abnormal results are displayed) Labs Reviewed  BASIC METABOLIC PANEL - Abnormal; Notable for the following components:      Result Value   Sodium 134 (*)    Glucose, Bld 107 (*)    Calcium 8.8 (*)    All other components within normal limits  CBC - Abnormal; Notable for the following components:   Hemoglobin 9.8 (*)    HCT 32.3 (*)    MCV 72.1 (*)    MCH 21.9 (*)    RDW 17.3 (*)    All other components within normal limits  URINALYSIS, COMPLETE (UACMP) WITH MICROSCOPIC  CBG MONITORING, ED  TROPONIN I (HIGH SENSITIVITY)   ____________________________________________   EKG  Interpreted by me Normal sinus rhythm rate of 71, normal axis and intervals.  Poor R wave progression.  Normal ST segments and T waves.  ____________________________________________    [id number]  DG Chest 2 View  Result Date: 01/06/2020 CLINICAL DATA:  Shortness of breath EXAM: CHEST - 2 VIEW COMPARISON:  08/29/2019 FINDINGS: Heart is normal size. Tortuous thoracic aorta. Hyperinflation of the lungs, stable. No confluent opacities or effusions. No acute bony abnormality. IMPRESSION: Mild hyperinflation.  No acute  cardiopulmonary disease. Electronically Signed   By: Rolm Baptise M.D.   On: 01/06/2020 19:03    ____________________________________________   PROCEDURES Procedures  ____________________________________________  DIFFERENTIAL DIAGNOSIS   Cerebral hemorrhage, dehydration, vertigo, fatigue  CLINICAL IMPRESSION / ASSESSMENT AND PLAN / ED COURSE  Medications ordered in the ED: Medications  sodium chloride flush (NS) 0.9 % injection 3 mL (has no administration in time range)  ondansetron (ZOFRAN-ODT) disintegrating tablet 8 mg (has no administration in time range)    Pertinent labs & imaging results that were available during my care of the patient were reviewed by me and considered in my medical decision making (see chart for details).  [name] was evaluated in Emergency Department on 01/07/2020 for the symptoms described in the history of present illness. He was evaluated in the context of the global COVID-19 pandemic, which necessitated consideration that the patient might be at risk for infection with the SARS-CoV-2 virus that causes COVID-19. Institutional protocols and algorithms that pertain to the evaluation of patients at risk for COVID-19 are in a state of rapid change based on information released by regulatory bodies including the CDC and federal and state organizations. These policies and algorithms were followed during the patient's care in the ED.   Patient presents with nausea, dizziness which is recurrent and worse over last 24 hours than his chronic state.  On exam he is nontoxic, no respiratory distress, vital signs are normal.  Exam is overall reassuring, he does not have any significant pain complaints.   Considering the patient's symptoms, medical history, and physical examination today, I have low suspicion for cholecystitis or biliary pathology, pancreatitis, perforation or bowel obstruction, hernia, intra-abdominal abscess, AAA or dissection, volvulus or  intussusception, mesenteric ischemia, or appendicitis.  I will check troponin to trend it given that it was about 55x2 yesterday.  I doubt ACS PE dissection.  Check CT head due to his recurrent symptoms, lack of recent neuroimaging.  Treat with p.o. Zofran, p.o. trial.  If CT head is reassuring, tolerating oral intake and feeling better, patient can be discharged home.  ----------------------------------------- 3:03 PM  on 01/07/2020 -----------------------------------------  Troponin again is stable at chronic baseline, labs unremarkable, CT head unremarkable.  Patient tolerating oral intake very well.  He reports his dizziness is better and he feels back to normal.  He stable for discharge to follow-up with primary care.  Prescription for Zofran as needed.     ____________________________________________   FINAL CLINICAL IMPRESSION(S) / ED DIAGNOSES    Final diagnoses:  Dizziness  Dehydration     ED Discharge Orders    None      Portions of this note were generated with dragon dictation software. Dictation errors may occur despite best attempts at proofreading.   Carrie Mew, MD 01/07/20 [place], MD 01/07/20 [phone]

## 2020-01-07 NOTE — ED Notes (Signed)
Pt urinated 200cc into urinal.

## 2020-01-07 NOTE — ED Notes (Signed)
Pt given snacks and drink. Given urinal per request.

## 2020-01-07 NOTE — ED Notes (Addendum)
See triage note. Pt reports nausea, SOB at times, and dizziness for past few days. Reports dec appetite for past few months. Denies vomiting. Denies fever or having had covid swab completed recently. States already had both covid shots. Pt in NAD. Resp reg/unlabored. Skin dry. Alert and resting calmly in bed. Reports history of COPD. Denies home oxygen but takes daily meds to address wheezing. Slight wheeze noted.

## 2020-01-07 NOTE — ED Notes (Signed)
Pt able to eat snacks and have some of his beverage without c/o nausea.

## 2020-01-07 NOTE — ED Provider Notes (Signed)
2nd troponin with no significant changes. Patient remains asymptomatic with no CP, SOB, or dizziness. Telemetry evaluated and showing no signs of dysrhythmias other than PVCs.  Patient has an appointment with his doctor coming up this week.  Recommended close follow-up with his doctor.  Discussed return precautions for recurrent chest pain, shortness of breath, or dizziness.  Patient will be discharged per plan left by Dr. Elenore Rota, [place], MD 01/07/20 [phone]

## 2020-01-07 NOTE — Discharge Instructions (Addendum)
Your CT scan of the head was okay, and your labs are all okay.  Take Zofran as needed for nausea, and be sure to eat regularly and drink fluids to stay hydrated.

## 2020-01-07 NOTE — ED Triage Notes (Addendum)
Pt comes into the ED via EMS from home with c/o dizziness with nausea since yesterday, pt was seen here last night and returns with same sx. VSS per EMS.

## 2020-01-08 DIAGNOSIS — E860 Dehydration: Secondary | ICD-10-CM | POA: Diagnosis not present

## 2020-01-08 DIAGNOSIS — Z7982 Long term (current) use of aspirin: Secondary | ICD-10-CM | POA: Diagnosis not present

## 2020-01-08 DIAGNOSIS — Z8249 Family history of ischemic heart disease and other diseases of the circulatory system: Secondary | ICD-10-CM | POA: Diagnosis not present

## 2020-01-08 DIAGNOSIS — R42 Dizziness and giddiness: Secondary | ICD-10-CM | POA: Diagnosis not present

## 2020-01-08 DIAGNOSIS — Z885 Allergy status to narcotic agent status: Secondary | ICD-10-CM | POA: Diagnosis not present

## 2020-01-08 DIAGNOSIS — Z888 Allergy status to other drugs, medicaments and biological substances status: Secondary | ICD-10-CM | POA: Diagnosis not present

## 2020-01-08 DIAGNOSIS — Z9181 History of falling: Secondary | ICD-10-CM | POA: Diagnosis not present

## 2020-01-08 DIAGNOSIS — Z833 Family history of diabetes mellitus: Secondary | ICD-10-CM | POA: Diagnosis not present

## 2020-01-08 DIAGNOSIS — E785 Hyperlipidemia, unspecified: Secondary | ICD-10-CM | POA: Diagnosis not present

## 2020-01-08 DIAGNOSIS — Z823 Family history of stroke: Secondary | ICD-10-CM | POA: Diagnosis not present

## 2020-01-08 DIAGNOSIS — J449 Chronic obstructive pulmonary disease, unspecified: Secondary | ICD-10-CM | POA: Diagnosis not present

## 2020-01-08 DIAGNOSIS — K219 Gastro-esophageal reflux disease without esophagitis: Secondary | ICD-10-CM | POA: Diagnosis not present

## 2020-01-08 DIAGNOSIS — E1122 Type 2 diabetes mellitus with diabetic chronic kidney disease: Secondary | ICD-10-CM | POA: Diagnosis not present

## 2020-01-08 DIAGNOSIS — I2510 Atherosclerotic heart disease of native coronary artery without angina pectoris: Secondary | ICD-10-CM | POA: Diagnosis not present

## 2020-01-08 DIAGNOSIS — E119 Type 2 diabetes mellitus without complications: Secondary | ICD-10-CM | POA: Diagnosis not present

## 2020-01-08 DIAGNOSIS — I10 Essential (primary) hypertension: Secondary | ICD-10-CM | POA: Diagnosis not present

## 2020-01-08 DIAGNOSIS — Z20822 Contact with and (suspected) exposure to covid-19: Secondary | ICD-10-CM | POA: Diagnosis not present

## 2020-01-08 DIAGNOSIS — Z8673 Personal history of transient ischemic attack (TIA), and cerebral infarction without residual deficits: Secondary | ICD-10-CM | POA: Diagnosis not present

## 2020-01-08 DIAGNOSIS — I4891 Unspecified atrial fibrillation: Secondary | ICD-10-CM | POA: Diagnosis not present

## 2020-01-08 DIAGNOSIS — R0602 Shortness of breath: Secondary | ICD-10-CM | POA: Diagnosis not present

## 2020-01-08 DIAGNOSIS — Z79899 Other long term (current) drug therapy: Secondary | ICD-10-CM | POA: Diagnosis not present

## 2020-01-08 DIAGNOSIS — R269 Unspecified abnormalities of gait and mobility: Secondary | ICD-10-CM | POA: Diagnosis not present

## 2020-01-08 DIAGNOSIS — Z91041 Radiographic dye allergy status: Secondary | ICD-10-CM | POA: Diagnosis not present

## 2020-01-08 DIAGNOSIS — N183 Chronic kidney disease, stage 3 unspecified: Secondary | ICD-10-CM | POA: Diagnosis not present

## 2020-01-08 DIAGNOSIS — R531 Weakness: Secondary | ICD-10-CM | POA: Diagnosis not present

## 2020-01-08 DIAGNOSIS — Z7984 Long term (current) use of oral hypoglycemic drugs: Secondary | ICD-10-CM | POA: Diagnosis not present

## 2020-01-17 DIAGNOSIS — E860 Dehydration: Secondary | ICD-10-CM | POA: Diagnosis not present

## 2020-01-17 DIAGNOSIS — R1013 Epigastric pain: Secondary | ICD-10-CM | POA: Diagnosis not present

## 2020-01-17 DIAGNOSIS — H538 Other visual disturbances: Secondary | ICD-10-CM | POA: Diagnosis not present

## 2020-01-17 DIAGNOSIS — H524 Presbyopia: Secondary | ICD-10-CM | POA: Diagnosis not present

## 2020-01-27 DIAGNOSIS — G8929 Other chronic pain: Secondary | ICD-10-CM | POA: Diagnosis not present

## 2020-01-27 DIAGNOSIS — R0600 Dyspnea, unspecified: Secondary | ICD-10-CM | POA: Diagnosis not present

## 2020-01-27 DIAGNOSIS — Z91041 Radiographic dye allergy status: Secondary | ICD-10-CM | POA: Diagnosis not present

## 2020-01-27 DIAGNOSIS — Z8673 Personal history of transient ischemic attack (TIA), and cerebral infarction without residual deficits: Secondary | ICD-10-CM | POA: Diagnosis not present

## 2020-01-27 DIAGNOSIS — R5383 Other fatigue: Secondary | ICD-10-CM | POA: Diagnosis not present

## 2020-01-27 DIAGNOSIS — I10 Essential (primary) hypertension: Secondary | ICD-10-CM | POA: Diagnosis not present

## 2020-01-27 DIAGNOSIS — E785 Hyperlipidemia, unspecified: Secondary | ICD-10-CM | POA: Diagnosis not present

## 2020-01-27 DIAGNOSIS — J449 Chronic obstructive pulmonary disease, unspecified: Secondary | ICD-10-CM | POA: Diagnosis not present

## 2020-01-27 DIAGNOSIS — G894 Chronic pain syndrome: Secondary | ICD-10-CM | POA: Diagnosis not present

## 2020-01-27 DIAGNOSIS — Z885 Allergy status to narcotic agent status: Secondary | ICD-10-CM | POA: Diagnosis not present

## 2020-01-27 DIAGNOSIS — Z20822 Contact with and (suspected) exposure to covid-19: Secondary | ICD-10-CM | POA: Diagnosis not present

## 2020-01-27 DIAGNOSIS — Z86010 Personal history of colonic polyps: Secondary | ICD-10-CM | POA: Diagnosis not present

## 2020-01-27 DIAGNOSIS — D649 Anemia, unspecified: Secondary | ICD-10-CM | POA: Diagnosis not present

## 2020-01-27 DIAGNOSIS — Z79899 Other long term (current) drug therapy: Secondary | ICD-10-CM | POA: Diagnosis not present

## 2020-01-27 DIAGNOSIS — I493 Ventricular premature depolarization: Secondary | ICD-10-CM | POA: Diagnosis not present

## 2020-01-27 DIAGNOSIS — Z7982 Long term (current) use of aspirin: Secondary | ICD-10-CM | POA: Diagnosis not present

## 2020-01-27 DIAGNOSIS — R0789 Other chest pain: Secondary | ICD-10-CM | POA: Diagnosis not present

## 2020-01-27 DIAGNOSIS — I2510 Atherosclerotic heart disease of native coronary artery without angina pectoris: Secondary | ICD-10-CM | POA: Diagnosis not present

## 2020-01-27 DIAGNOSIS — R0602 Shortness of breath: Secondary | ICD-10-CM | POA: Diagnosis not present

## 2020-01-27 DIAGNOSIS — E119 Type 2 diabetes mellitus without complications: Secondary | ICD-10-CM | POA: Diagnosis not present

## 2020-01-27 DIAGNOSIS — Z7984 Long term (current) use of oral hypoglycemic drugs: Secondary | ICD-10-CM | POA: Diagnosis not present

## 2020-01-27 DIAGNOSIS — R6881 Early satiety: Secondary | ICD-10-CM | POA: Diagnosis not present

## 2020-01-27 DIAGNOSIS — Z7901 Long term (current) use of anticoagulants: Secondary | ICD-10-CM | POA: Diagnosis not present

## 2020-01-27 DIAGNOSIS — K219 Gastro-esophageal reflux disease without esophagitis: Secondary | ICD-10-CM | POA: Diagnosis not present

## 2020-01-28 DIAGNOSIS — D649 Anemia, unspecified: Secondary | ICD-10-CM | POA: Diagnosis not present

## 2020-01-28 DIAGNOSIS — K219 Gastro-esophageal reflux disease without esophagitis: Secondary | ICD-10-CM | POA: Diagnosis not present

## 2020-01-28 DIAGNOSIS — R0600 Dyspnea, unspecified: Secondary | ICD-10-CM | POA: Diagnosis not present

## 2020-01-28 DIAGNOSIS — J449 Chronic obstructive pulmonary disease, unspecified: Secondary | ICD-10-CM | POA: Diagnosis not present

## 2020-02-06 DIAGNOSIS — E1122 Type 2 diabetes mellitus with diabetic chronic kidney disease: Secondary | ICD-10-CM | POA: Diagnosis not present

## 2020-02-06 DIAGNOSIS — N183 Chronic kidney disease, stage 3 unspecified: Secondary | ICD-10-CM | POA: Diagnosis not present

## 2020-02-09 DIAGNOSIS — G8929 Other chronic pain: Secondary | ICD-10-CM | POA: Diagnosis not present

## 2020-02-09 DIAGNOSIS — K219 Gastro-esophageal reflux disease without esophagitis: Secondary | ICD-10-CM | POA: Diagnosis not present

## 2020-02-09 DIAGNOSIS — E119 Type 2 diabetes mellitus without complications: Secondary | ICD-10-CM | POA: Diagnosis not present

## 2020-02-09 DIAGNOSIS — M79605 Pain in left leg: Secondary | ICD-10-CM | POA: Diagnosis not present

## 2020-02-09 DIAGNOSIS — R55 Syncope and collapse: Secondary | ICD-10-CM | POA: Diagnosis not present

## 2020-02-09 DIAGNOSIS — Z7409 Other reduced mobility: Secondary | ICD-10-CM | POA: Diagnosis not present

## 2020-02-09 DIAGNOSIS — E785 Hyperlipidemia, unspecified: Secondary | ICD-10-CM | POA: Diagnosis not present

## 2020-02-09 DIAGNOSIS — Z20822 Contact with and (suspected) exposure to covid-19: Secondary | ICD-10-CM | POA: Diagnosis not present

## 2020-02-09 DIAGNOSIS — R0789 Other chest pain: Secondary | ICD-10-CM | POA: Diagnosis not present

## 2020-02-09 DIAGNOSIS — Z8673 Personal history of transient ischemic attack (TIA), and cerebral infarction without residual deficits: Secondary | ICD-10-CM | POA: Diagnosis not present

## 2020-02-09 DIAGNOSIS — J449 Chronic obstructive pulmonary disease, unspecified: Secondary | ICD-10-CM | POA: Diagnosis not present

## 2020-02-09 DIAGNOSIS — E860 Dehydration: Secondary | ICD-10-CM | POA: Diagnosis not present

## 2020-02-09 DIAGNOSIS — Z86010 Personal history of colonic polyps: Secondary | ICD-10-CM | POA: Diagnosis not present

## 2020-02-09 DIAGNOSIS — R0602 Shortness of breath: Secondary | ICD-10-CM | POA: Diagnosis not present

## 2020-02-09 DIAGNOSIS — I10 Essential (primary) hypertension: Secondary | ICD-10-CM | POA: Diagnosis not present

## 2020-02-09 DIAGNOSIS — R000 Tachycardia, unspecified: Secondary | ICD-10-CM | POA: Diagnosis not present

## 2020-02-09 DIAGNOSIS — R0600 Dyspnea, unspecified: Secondary | ICD-10-CM | POA: Diagnosis not present

## 2020-02-09 DIAGNOSIS — I2510 Atherosclerotic heart disease of native coronary artery without angina pectoris: Secondary | ICD-10-CM | POA: Diagnosis not present

## 2020-02-09 DIAGNOSIS — Z9181 History of falling: Secondary | ICD-10-CM | POA: Diagnosis not present

## 2020-02-09 DIAGNOSIS — E1129 Type 2 diabetes mellitus with other diabetic kidney complication: Secondary | ICD-10-CM | POA: Diagnosis not present

## 2020-02-09 DIAGNOSIS — Z86718 Personal history of other venous thrombosis and embolism: Secondary | ICD-10-CM | POA: Diagnosis not present

## 2020-02-09 DIAGNOSIS — M5430 Sciatica, unspecified side: Secondary | ICD-10-CM | POA: Diagnosis not present

## 2020-02-09 DIAGNOSIS — K449 Diaphragmatic hernia without obstruction or gangrene: Secondary | ICD-10-CM | POA: Diagnosis not present

## 2020-02-09 DIAGNOSIS — Z9109 Other allergy status, other than to drugs and biological substances: Secondary | ICD-10-CM | POA: Diagnosis not present

## 2020-02-09 DIAGNOSIS — Z888 Allergy status to other drugs, medicaments and biological substances status: Secondary | ICD-10-CM | POA: Diagnosis not present

## 2020-02-10 DIAGNOSIS — R791 Abnormal coagulation profile: Secondary | ICD-10-CM | POA: Diagnosis not present

## 2020-02-10 DIAGNOSIS — I10 Essential (primary) hypertension: Secondary | ICD-10-CM | POA: Diagnosis not present

## 2020-02-10 DIAGNOSIS — F17210 Nicotine dependence, cigarettes, uncomplicated: Secondary | ICD-10-CM | POA: Diagnosis not present

## 2020-02-10 DIAGNOSIS — D509 Iron deficiency anemia, unspecified: Secondary | ICD-10-CM | POA: Diagnosis not present

## 2020-02-10 DIAGNOSIS — Z86718 Personal history of other venous thrombosis and embolism: Secondary | ICD-10-CM | POA: Diagnosis not present

## 2020-02-10 DIAGNOSIS — R000 Tachycardia, unspecified: Secondary | ICD-10-CM | POA: Diagnosis not present

## 2020-02-11 DIAGNOSIS — E782 Mixed hyperlipidemia: Secondary | ICD-10-CM | POA: Diagnosis not present

## 2020-02-11 DIAGNOSIS — R0600 Dyspnea, unspecified: Secondary | ICD-10-CM | POA: Diagnosis not present

## 2020-02-11 DIAGNOSIS — I10 Essential (primary) hypertension: Secondary | ICD-10-CM | POA: Diagnosis not present

## 2020-02-11 DIAGNOSIS — K2100 Gastro-esophageal reflux disease with esophagitis, without bleeding: Secondary | ICD-10-CM | POA: Diagnosis not present

## 2020-02-11 DIAGNOSIS — E1129 Type 2 diabetes mellitus with other diabetic kidney complication: Secondary | ICD-10-CM | POA: Diagnosis not present

## 2020-02-12 DIAGNOSIS — R110 Nausea: Secondary | ICD-10-CM | POA: Diagnosis not present

## 2020-02-12 DIAGNOSIS — R531 Weakness: Secondary | ICD-10-CM | POA: Diagnosis not present

## 2020-02-12 DIAGNOSIS — I10 Essential (primary) hypertension: Secondary | ICD-10-CM | POA: Diagnosis not present

## 2020-02-12 DIAGNOSIS — I639 Cerebral infarction, unspecified: Secondary | ICD-10-CM | POA: Diagnosis not present

## 2020-02-12 DIAGNOSIS — K219 Gastro-esophageal reflux disease without esophagitis: Secondary | ICD-10-CM | POA: Diagnosis not present

## 2020-02-12 DIAGNOSIS — Z885 Allergy status to narcotic agent status: Secondary | ICD-10-CM | POA: Diagnosis not present

## 2020-02-12 DIAGNOSIS — E119 Type 2 diabetes mellitus without complications: Secondary | ICD-10-CM | POA: Diagnosis not present

## 2020-02-12 DIAGNOSIS — E785 Hyperlipidemia, unspecified: Secondary | ICD-10-CM | POA: Diagnosis not present

## 2020-02-12 DIAGNOSIS — Z7984 Long term (current) use of oral hypoglycemic drugs: Secondary | ICD-10-CM | POA: Diagnosis not present

## 2020-02-12 DIAGNOSIS — R0602 Shortness of breath: Secondary | ICD-10-CM | POA: Diagnosis not present

## 2020-02-12 DIAGNOSIS — J449 Chronic obstructive pulmonary disease, unspecified: Secondary | ICD-10-CM | POA: Diagnosis not present

## 2020-02-12 DIAGNOSIS — R519 Headache, unspecified: Secondary | ICD-10-CM | POA: Diagnosis not present

## 2020-02-12 DIAGNOSIS — Z79899 Other long term (current) drug therapy: Secondary | ICD-10-CM | POA: Diagnosis not present

## 2020-02-12 DIAGNOSIS — Z91041 Radiographic dye allergy status: Secondary | ICD-10-CM | POA: Diagnosis not present

## 2020-02-12 DIAGNOSIS — R930 Abnormal findings on diagnostic imaging of skull and head, not elsewhere classified: Secondary | ICD-10-CM | POA: Diagnosis not present

## 2020-02-12 DIAGNOSIS — Z8673 Personal history of transient ischemic attack (TIA), and cerebral infarction without residual deficits: Secondary | ICD-10-CM | POA: Diagnosis not present

## 2020-02-12 DIAGNOSIS — R42 Dizziness and giddiness: Secondary | ICD-10-CM | POA: Diagnosis not present

## 2020-02-12 DIAGNOSIS — I2510 Atherosclerotic heart disease of native coronary artery without angina pectoris: Secondary | ICD-10-CM | POA: Diagnosis not present

## 2020-02-13 DIAGNOSIS — I10 Essential (primary) hypertension: Secondary | ICD-10-CM | POA: Diagnosis not present

## 2020-02-13 DIAGNOSIS — Z7984 Long term (current) use of oral hypoglycemic drugs: Secondary | ICD-10-CM | POA: Diagnosis not present

## 2020-02-13 DIAGNOSIS — G8929 Other chronic pain: Secondary | ICD-10-CM | POA: Diagnosis not present

## 2020-02-13 DIAGNOSIS — G319 Degenerative disease of nervous system, unspecified: Secondary | ICD-10-CM | POA: Diagnosis not present

## 2020-02-13 DIAGNOSIS — R1310 Dysphagia, unspecified: Secondary | ICD-10-CM | POA: Diagnosis not present

## 2020-02-13 DIAGNOSIS — I2510 Atherosclerotic heart disease of native coronary artery without angina pectoris: Secondary | ICD-10-CM | POA: Diagnosis not present

## 2020-02-13 DIAGNOSIS — E1122 Type 2 diabetes mellitus with diabetic chronic kidney disease: Secondary | ICD-10-CM | POA: Diagnosis not present

## 2020-02-13 DIAGNOSIS — J449 Chronic obstructive pulmonary disease, unspecified: Secondary | ICD-10-CM | POA: Diagnosis not present

## 2020-02-13 DIAGNOSIS — I6523 Occlusion and stenosis of bilateral carotid arteries: Secondary | ICD-10-CM | POA: Diagnosis not present

## 2020-02-13 DIAGNOSIS — M5136 Other intervertebral disc degeneration, lumbar region: Secondary | ICD-10-CM | POA: Diagnosis not present

## 2020-02-13 DIAGNOSIS — D509 Iron deficiency anemia, unspecified: Secondary | ICD-10-CM | POA: Diagnosis not present

## 2020-02-13 DIAGNOSIS — N182 Chronic kidney disease, stage 2 (mild): Secondary | ICD-10-CM | POA: Diagnosis not present

## 2020-02-13 DIAGNOSIS — M48061 Spinal stenosis, lumbar region without neurogenic claudication: Secondary | ICD-10-CM | POA: Diagnosis not present

## 2020-02-14 DIAGNOSIS — M5432 Sciatica, left side: Secondary | ICD-10-CM | POA: Diagnosis not present

## 2020-02-14 DIAGNOSIS — R1310 Dysphagia, unspecified: Secondary | ICD-10-CM | POA: Diagnosis not present

## 2020-02-14 DIAGNOSIS — E1122 Type 2 diabetes mellitus with diabetic chronic kidney disease: Secondary | ICD-10-CM | POA: Diagnosis not present

## 2020-02-14 DIAGNOSIS — M48061 Spinal stenosis, lumbar region without neurogenic claudication: Secondary | ICD-10-CM | POA: Diagnosis not present

## 2020-02-18 DIAGNOSIS — M48061 Spinal stenosis, lumbar region without neurogenic claudication: Secondary | ICD-10-CM | POA: Diagnosis not present

## 2020-02-18 DIAGNOSIS — R1310 Dysphagia, unspecified: Secondary | ICD-10-CM | POA: Diagnosis not present

## 2020-02-18 DIAGNOSIS — J449 Chronic obstructive pulmonary disease, unspecified: Secondary | ICD-10-CM | POA: Diagnosis not present

## 2020-02-18 DIAGNOSIS — G8929 Other chronic pain: Secondary | ICD-10-CM | POA: Diagnosis not present

## 2020-02-18 DIAGNOSIS — I2510 Atherosclerotic heart disease of native coronary artery without angina pectoris: Secondary | ICD-10-CM | POA: Diagnosis not present

## 2020-02-18 DIAGNOSIS — E1122 Type 2 diabetes mellitus with diabetic chronic kidney disease: Secondary | ICD-10-CM | POA: Diagnosis not present

## 2020-02-18 DIAGNOSIS — I10 Essential (primary) hypertension: Secondary | ICD-10-CM | POA: Diagnosis not present

## 2020-02-18 DIAGNOSIS — G319 Degenerative disease of nervous system, unspecified: Secondary | ICD-10-CM | POA: Diagnosis not present

## 2020-02-18 DIAGNOSIS — M5136 Other intervertebral disc degeneration, lumbar region: Secondary | ICD-10-CM | POA: Diagnosis not present

## 2020-02-18 DIAGNOSIS — I6523 Occlusion and stenosis of bilateral carotid arteries: Secondary | ICD-10-CM | POA: Diagnosis not present

## 2020-02-18 DIAGNOSIS — N182 Chronic kidney disease, stage 2 (mild): Secondary | ICD-10-CM | POA: Diagnosis not present

## 2020-02-18 DIAGNOSIS — D509 Iron deficiency anemia, unspecified: Secondary | ICD-10-CM | POA: Diagnosis not present

## 2020-02-18 DIAGNOSIS — Z7984 Long term (current) use of oral hypoglycemic drugs: Secondary | ICD-10-CM | POA: Diagnosis not present

## 2020-02-19 DIAGNOSIS — I10 Essential (primary) hypertension: Secondary | ICD-10-CM | POA: Diagnosis not present

## 2020-02-19 DIAGNOSIS — D509 Iron deficiency anemia, unspecified: Secondary | ICD-10-CM | POA: Diagnosis not present

## 2020-02-19 DIAGNOSIS — R6881 Early satiety: Secondary | ICD-10-CM | POA: Diagnosis not present

## 2020-02-19 DIAGNOSIS — G319 Degenerative disease of nervous system, unspecified: Secondary | ICD-10-CM | POA: Diagnosis not present

## 2020-02-19 DIAGNOSIS — I2510 Atherosclerotic heart disease of native coronary artery without angina pectoris: Secondary | ICD-10-CM | POA: Diagnosis not present

## 2020-02-19 DIAGNOSIS — G8929 Other chronic pain: Secondary | ICD-10-CM | POA: Diagnosis not present

## 2020-02-19 DIAGNOSIS — J449 Chronic obstructive pulmonary disease, unspecified: Secondary | ICD-10-CM | POA: Diagnosis not present

## 2020-02-19 DIAGNOSIS — N182 Chronic kidney disease, stage 2 (mild): Secondary | ICD-10-CM | POA: Diagnosis not present

## 2020-02-19 DIAGNOSIS — I6523 Occlusion and stenosis of bilateral carotid arteries: Secondary | ICD-10-CM | POA: Diagnosis not present

## 2020-02-19 DIAGNOSIS — Z7984 Long term (current) use of oral hypoglycemic drugs: Secondary | ICD-10-CM | POA: Diagnosis not present

## 2020-02-19 DIAGNOSIS — R1310 Dysphagia, unspecified: Secondary | ICD-10-CM | POA: Diagnosis not present

## 2020-02-19 DIAGNOSIS — M48061 Spinal stenosis, lumbar region without neurogenic claudication: Secondary | ICD-10-CM | POA: Diagnosis not present

## 2020-02-19 DIAGNOSIS — M5136 Other intervertebral disc degeneration, lumbar region: Secondary | ICD-10-CM | POA: Diagnosis not present

## 2020-02-19 DIAGNOSIS — R1013 Epigastric pain: Secondary | ICD-10-CM | POA: Diagnosis not present

## 2020-02-19 DIAGNOSIS — E1122 Type 2 diabetes mellitus with diabetic chronic kidney disease: Secondary | ICD-10-CM | POA: Diagnosis not present

## 2020-02-20 DIAGNOSIS — N182 Chronic kidney disease, stage 2 (mild): Secondary | ICD-10-CM | POA: Diagnosis not present

## 2020-02-20 DIAGNOSIS — E1122 Type 2 diabetes mellitus with diabetic chronic kidney disease: Secondary | ICD-10-CM | POA: Diagnosis not present

## 2020-02-20 DIAGNOSIS — M48061 Spinal stenosis, lumbar region without neurogenic claudication: Secondary | ICD-10-CM | POA: Diagnosis not present

## 2020-02-20 DIAGNOSIS — G8929 Other chronic pain: Secondary | ICD-10-CM | POA: Diagnosis not present

## 2020-02-20 DIAGNOSIS — D509 Iron deficiency anemia, unspecified: Secondary | ICD-10-CM | POA: Diagnosis not present

## 2020-02-20 DIAGNOSIS — I10 Essential (primary) hypertension: Secondary | ICD-10-CM | POA: Diagnosis not present

## 2020-02-20 DIAGNOSIS — I6523 Occlusion and stenosis of bilateral carotid arteries: Secondary | ICD-10-CM | POA: Diagnosis not present

## 2020-02-20 DIAGNOSIS — Z7984 Long term (current) use of oral hypoglycemic drugs: Secondary | ICD-10-CM | POA: Diagnosis not present

## 2020-02-20 DIAGNOSIS — M5136 Other intervertebral disc degeneration, lumbar region: Secondary | ICD-10-CM | POA: Diagnosis not present

## 2020-02-20 DIAGNOSIS — G319 Degenerative disease of nervous system, unspecified: Secondary | ICD-10-CM | POA: Diagnosis not present

## 2020-02-20 DIAGNOSIS — J449 Chronic obstructive pulmonary disease, unspecified: Secondary | ICD-10-CM | POA: Diagnosis not present

## 2020-02-20 DIAGNOSIS — R1310 Dysphagia, unspecified: Secondary | ICD-10-CM | POA: Diagnosis not present

## 2020-02-20 DIAGNOSIS — I2510 Atherosclerotic heart disease of native coronary artery without angina pectoris: Secondary | ICD-10-CM | POA: Diagnosis not present

## 2020-02-21 DIAGNOSIS — N182 Chronic kidney disease, stage 2 (mild): Secondary | ICD-10-CM | POA: Diagnosis not present

## 2020-02-21 DIAGNOSIS — I2510 Atherosclerotic heart disease of native coronary artery without angina pectoris: Secondary | ICD-10-CM | POA: Diagnosis not present

## 2020-02-21 DIAGNOSIS — R6881 Early satiety: Secondary | ICD-10-CM | POA: Insufficient documentation

## 2020-02-21 DIAGNOSIS — M48061 Spinal stenosis, lumbar region without neurogenic claudication: Secondary | ICD-10-CM | POA: Diagnosis not present

## 2020-02-21 DIAGNOSIS — M5136 Other intervertebral disc degeneration, lumbar region: Secondary | ICD-10-CM | POA: Diagnosis not present

## 2020-02-21 DIAGNOSIS — J449 Chronic obstructive pulmonary disease, unspecified: Secondary | ICD-10-CM | POA: Diagnosis not present

## 2020-02-21 DIAGNOSIS — G319 Degenerative disease of nervous system, unspecified: Secondary | ICD-10-CM | POA: Diagnosis not present

## 2020-02-21 DIAGNOSIS — G8929 Other chronic pain: Secondary | ICD-10-CM | POA: Diagnosis not present

## 2020-02-21 DIAGNOSIS — E1122 Type 2 diabetes mellitus with diabetic chronic kidney disease: Secondary | ICD-10-CM | POA: Diagnosis not present

## 2020-02-21 DIAGNOSIS — I10 Essential (primary) hypertension: Secondary | ICD-10-CM | POA: Diagnosis not present

## 2020-02-21 DIAGNOSIS — Z7984 Long term (current) use of oral hypoglycemic drugs: Secondary | ICD-10-CM | POA: Diagnosis not present

## 2020-02-21 DIAGNOSIS — D509 Iron deficiency anemia, unspecified: Secondary | ICD-10-CM | POA: Diagnosis not present

## 2020-02-21 DIAGNOSIS — R1310 Dysphagia, unspecified: Secondary | ICD-10-CM | POA: Diagnosis not present

## 2020-02-21 DIAGNOSIS — I6523 Occlusion and stenosis of bilateral carotid arteries: Secondary | ICD-10-CM | POA: Diagnosis not present

## 2020-02-24 DIAGNOSIS — G8929 Other chronic pain: Secondary | ICD-10-CM | POA: Diagnosis not present

## 2020-02-24 DIAGNOSIS — M48061 Spinal stenosis, lumbar region without neurogenic claudication: Secondary | ICD-10-CM | POA: Diagnosis not present

## 2020-02-24 DIAGNOSIS — N182 Chronic kidney disease, stage 2 (mild): Secondary | ICD-10-CM | POA: Diagnosis not present

## 2020-02-24 DIAGNOSIS — D509 Iron deficiency anemia, unspecified: Secondary | ICD-10-CM | POA: Diagnosis not present

## 2020-02-24 DIAGNOSIS — I10 Essential (primary) hypertension: Secondary | ICD-10-CM | POA: Diagnosis not present

## 2020-02-24 DIAGNOSIS — J449 Chronic obstructive pulmonary disease, unspecified: Secondary | ICD-10-CM | POA: Diagnosis not present

## 2020-02-24 DIAGNOSIS — I2510 Atherosclerotic heart disease of native coronary artery without angina pectoris: Secondary | ICD-10-CM | POA: Diagnosis not present

## 2020-02-24 DIAGNOSIS — E1122 Type 2 diabetes mellitus with diabetic chronic kidney disease: Secondary | ICD-10-CM | POA: Diagnosis not present

## 2020-02-24 DIAGNOSIS — I6523 Occlusion and stenosis of bilateral carotid arteries: Secondary | ICD-10-CM | POA: Diagnosis not present

## 2020-02-24 DIAGNOSIS — M5136 Other intervertebral disc degeneration, lumbar region: Secondary | ICD-10-CM | POA: Diagnosis not present

## 2020-02-24 DIAGNOSIS — R1310 Dysphagia, unspecified: Secondary | ICD-10-CM | POA: Diagnosis not present

## 2020-02-24 DIAGNOSIS — Z7984 Long term (current) use of oral hypoglycemic drugs: Secondary | ICD-10-CM | POA: Diagnosis not present

## 2020-02-24 DIAGNOSIS — G319 Degenerative disease of nervous system, unspecified: Secondary | ICD-10-CM | POA: Diagnosis not present

## 2020-02-25 DIAGNOSIS — I10 Essential (primary) hypertension: Secondary | ICD-10-CM | POA: Diagnosis not present

## 2020-02-25 DIAGNOSIS — J449 Chronic obstructive pulmonary disease, unspecified: Secondary | ICD-10-CM | POA: Diagnosis not present

## 2020-02-25 DIAGNOSIS — N182 Chronic kidney disease, stage 2 (mild): Secondary | ICD-10-CM | POA: Diagnosis not present

## 2020-02-25 DIAGNOSIS — E1122 Type 2 diabetes mellitus with diabetic chronic kidney disease: Secondary | ICD-10-CM | POA: Diagnosis not present

## 2020-02-25 DIAGNOSIS — D509 Iron deficiency anemia, unspecified: Secondary | ICD-10-CM | POA: Diagnosis not present

## 2020-02-25 DIAGNOSIS — I6523 Occlusion and stenosis of bilateral carotid arteries: Secondary | ICD-10-CM | POA: Diagnosis not present

## 2020-02-25 DIAGNOSIS — G319 Degenerative disease of nervous system, unspecified: Secondary | ICD-10-CM | POA: Diagnosis not present

## 2020-02-25 DIAGNOSIS — M5136 Other intervertebral disc degeneration, lumbar region: Secondary | ICD-10-CM | POA: Diagnosis not present

## 2020-02-25 DIAGNOSIS — I2510 Atherosclerotic heart disease of native coronary artery without angina pectoris: Secondary | ICD-10-CM | POA: Diagnosis not present

## 2020-02-25 DIAGNOSIS — G8929 Other chronic pain: Secondary | ICD-10-CM | POA: Diagnosis not present

## 2020-02-25 DIAGNOSIS — R1310 Dysphagia, unspecified: Secondary | ICD-10-CM | POA: Diagnosis not present

## 2020-02-25 DIAGNOSIS — M48061 Spinal stenosis, lumbar region without neurogenic claudication: Secondary | ICD-10-CM | POA: Diagnosis not present

## 2020-02-25 DIAGNOSIS — Z7984 Long term (current) use of oral hypoglycemic drugs: Secondary | ICD-10-CM | POA: Diagnosis not present

## 2020-02-27 DIAGNOSIS — Z970 Presence of artificial eye: Secondary | ICD-10-CM | POA: Diagnosis not present

## 2020-02-27 DIAGNOSIS — H538 Other visual disturbances: Secondary | ICD-10-CM | POA: Diagnosis not present

## 2020-02-27 DIAGNOSIS — E119 Type 2 diabetes mellitus without complications: Secondary | ICD-10-CM | POA: Diagnosis not present

## 2020-02-28 DIAGNOSIS — M48061 Spinal stenosis, lumbar region without neurogenic claudication: Secondary | ICD-10-CM | POA: Diagnosis not present

## 2020-02-28 DIAGNOSIS — N182 Chronic kidney disease, stage 2 (mild): Secondary | ICD-10-CM | POA: Diagnosis not present

## 2020-02-28 DIAGNOSIS — I10 Essential (primary) hypertension: Secondary | ICD-10-CM | POA: Diagnosis not present

## 2020-02-28 DIAGNOSIS — G319 Degenerative disease of nervous system, unspecified: Secondary | ICD-10-CM | POA: Diagnosis not present

## 2020-02-28 DIAGNOSIS — D509 Iron deficiency anemia, unspecified: Secondary | ICD-10-CM | POA: Diagnosis not present

## 2020-02-28 DIAGNOSIS — G8929 Other chronic pain: Secondary | ICD-10-CM | POA: Diagnosis not present

## 2020-02-28 DIAGNOSIS — J449 Chronic obstructive pulmonary disease, unspecified: Secondary | ICD-10-CM | POA: Diagnosis not present

## 2020-02-28 DIAGNOSIS — Z7984 Long term (current) use of oral hypoglycemic drugs: Secondary | ICD-10-CM | POA: Diagnosis not present

## 2020-02-28 DIAGNOSIS — E1122 Type 2 diabetes mellitus with diabetic chronic kidney disease: Secondary | ICD-10-CM | POA: Diagnosis not present

## 2020-02-28 DIAGNOSIS — M5136 Other intervertebral disc degeneration, lumbar region: Secondary | ICD-10-CM | POA: Diagnosis not present

## 2020-02-28 DIAGNOSIS — R1310 Dysphagia, unspecified: Secondary | ICD-10-CM | POA: Diagnosis not present

## 2020-02-28 DIAGNOSIS — I2510 Atherosclerotic heart disease of native coronary artery without angina pectoris: Secondary | ICD-10-CM | POA: Diagnosis not present

## 2020-02-28 DIAGNOSIS — I6523 Occlusion and stenosis of bilateral carotid arteries: Secondary | ICD-10-CM | POA: Diagnosis not present

## 2020-02-29 IMAGING — CR DG CHEST 2V
1 series · 3 of 3 positions shown · non-contrast
Comparison: 07/27/2017

CLINICAL DATA: diabetic emergency, but pts blood sugar was 139. Pt
reported feeling dizzy upon standing and was given 500ml NS pta.
Protocol initiated Smoker. COPD, CAD, DM, TIA, HTN

EXAM:
CHEST - 2 VIEW

[Series 1: dg chest 2 view · 0.14mm/px · 3 of 3 slices shown]
[im 1/3]
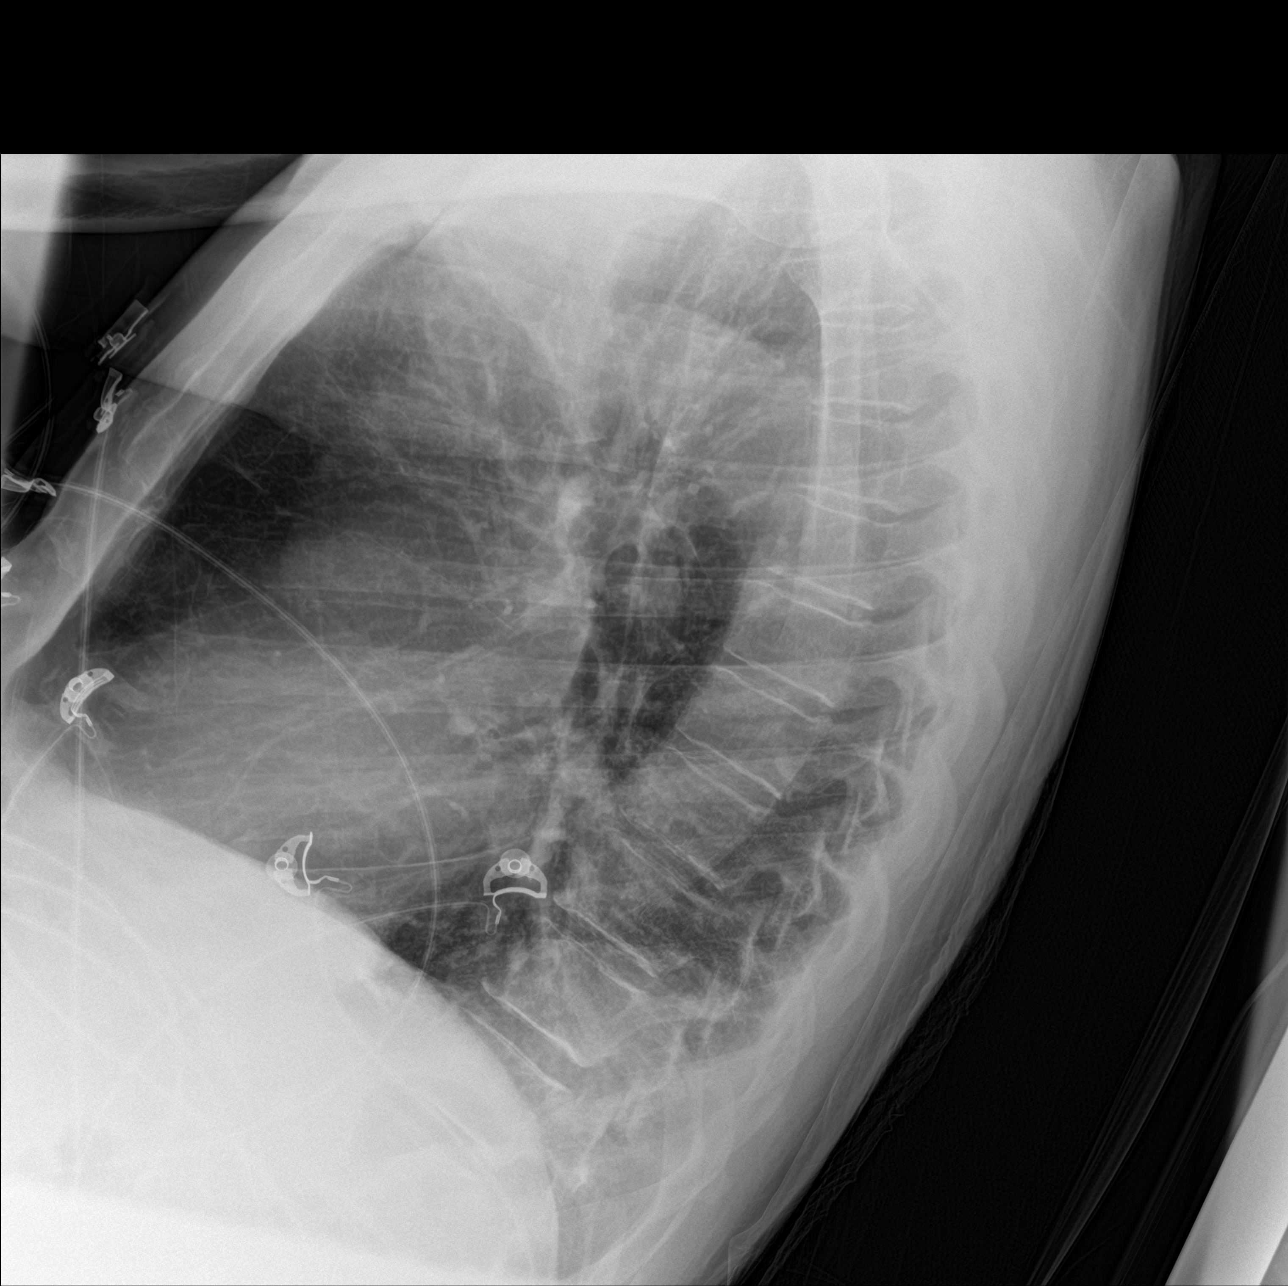
[im 2/3]
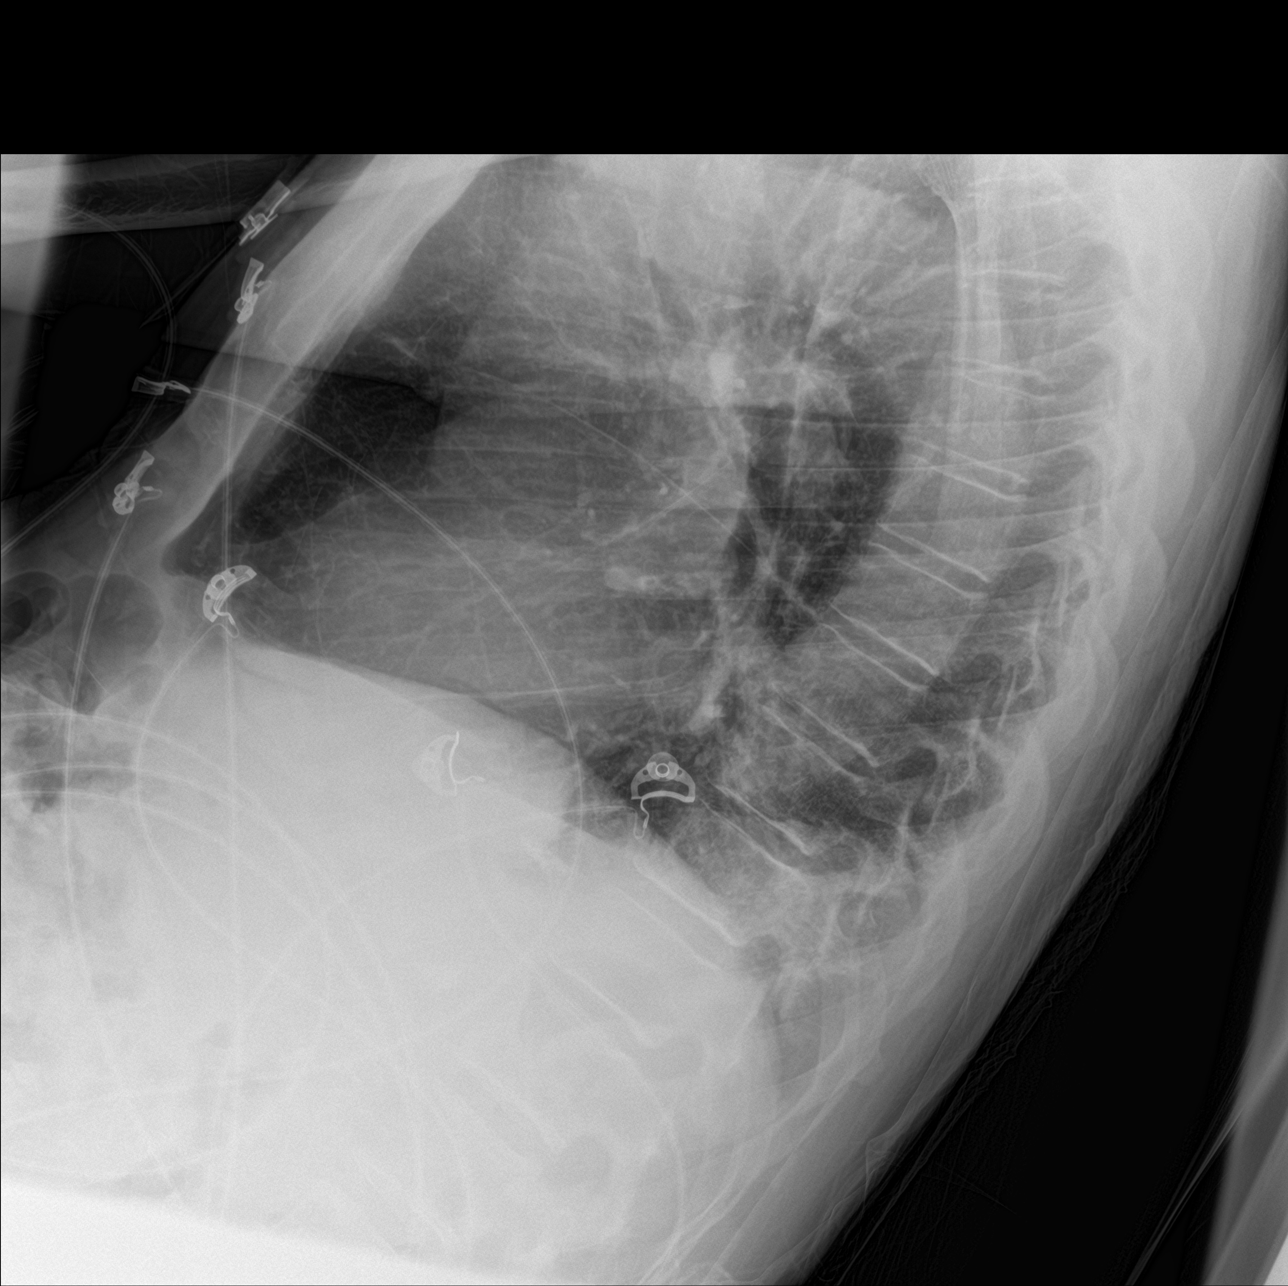
[im 3/3]
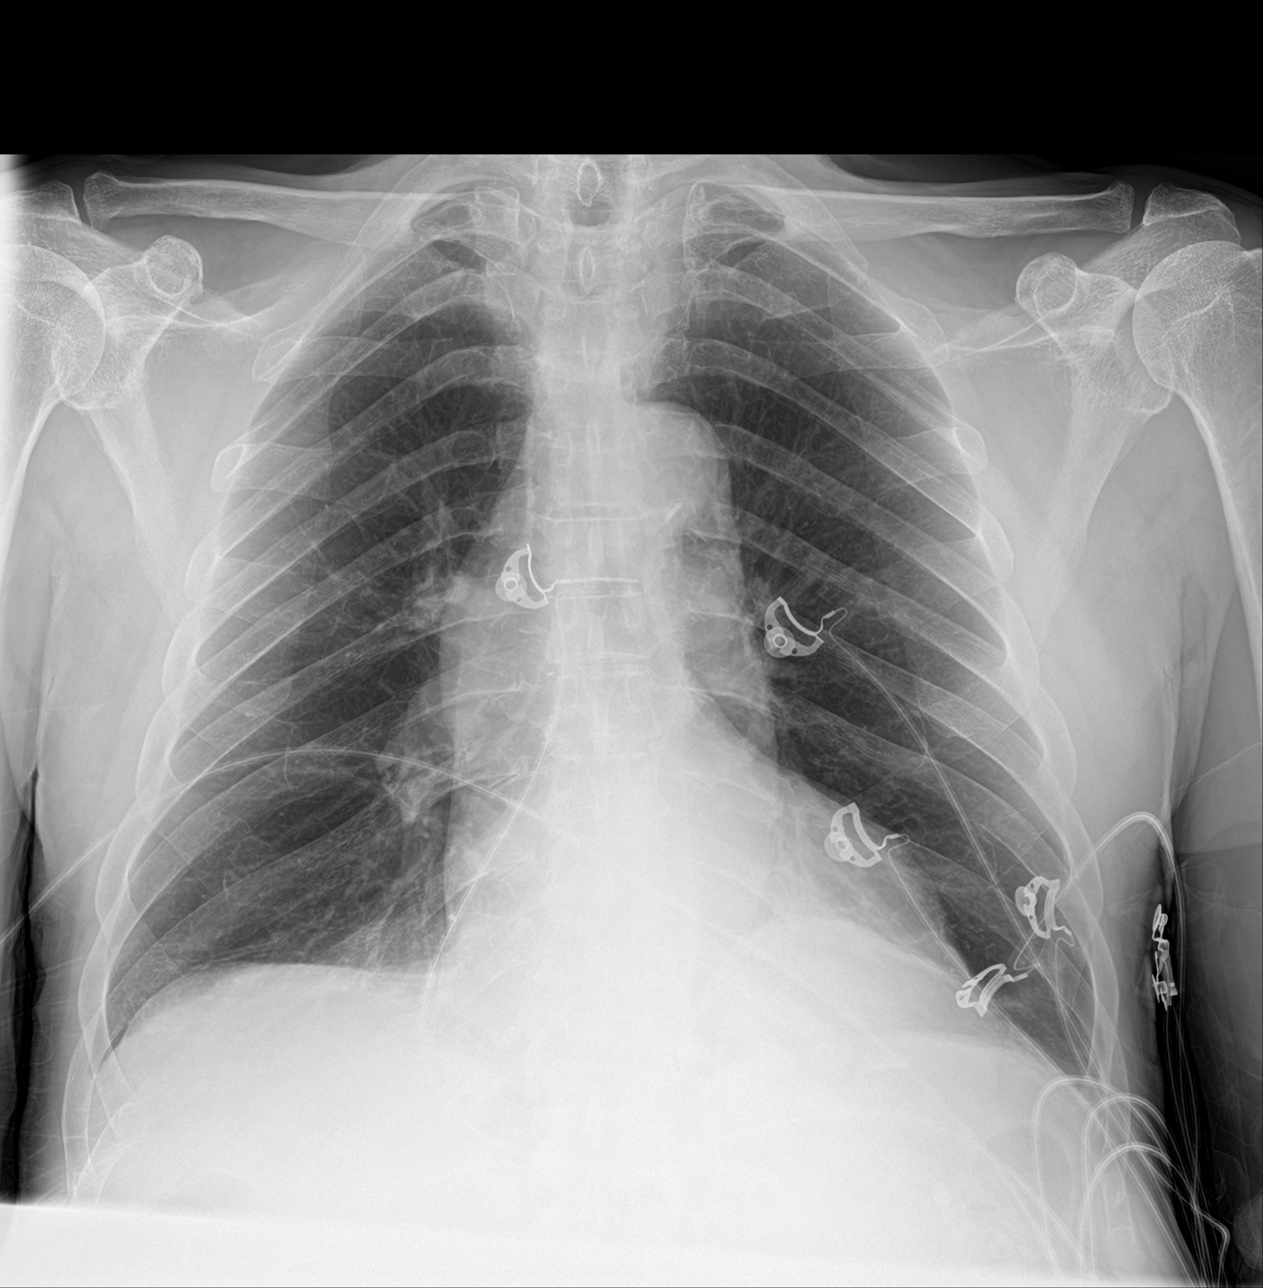

[3 of 3 positions shown; findings below may reference images not displayed]

FINDINGS: Lungs clear, mildly hyperinflated.

Heart size and mediastinal contours are within normal limits. Aortic
Atherosclerosis (DC5C5-170.0) and mild tortuosity.

No effusion. Stable focal eventration of the left diaphragmatic
leaflet.

Visualized bones unremarkable.
IMPRESSION: No acute cardiopulmonary disease.

## 2020-03-03 DIAGNOSIS — G319 Degenerative disease of nervous system, unspecified: Secondary | ICD-10-CM | POA: Diagnosis not present

## 2020-03-03 DIAGNOSIS — N182 Chronic kidney disease, stage 2 (mild): Secondary | ICD-10-CM | POA: Diagnosis not present

## 2020-03-03 DIAGNOSIS — I6523 Occlusion and stenosis of bilateral carotid arteries: Secondary | ICD-10-CM | POA: Diagnosis not present

## 2020-03-03 DIAGNOSIS — R1310 Dysphagia, unspecified: Secondary | ICD-10-CM | POA: Diagnosis not present

## 2020-03-03 DIAGNOSIS — I10 Essential (primary) hypertension: Secondary | ICD-10-CM | POA: Diagnosis not present

## 2020-03-03 DIAGNOSIS — Z7984 Long term (current) use of oral hypoglycemic drugs: Secondary | ICD-10-CM | POA: Diagnosis not present

## 2020-03-03 DIAGNOSIS — M5136 Other intervertebral disc degeneration, lumbar region: Secondary | ICD-10-CM | POA: Diagnosis not present

## 2020-03-03 DIAGNOSIS — G8929 Other chronic pain: Secondary | ICD-10-CM | POA: Diagnosis not present

## 2020-03-03 DIAGNOSIS — M48061 Spinal stenosis, lumbar region without neurogenic claudication: Secondary | ICD-10-CM | POA: Diagnosis not present

## 2020-03-03 DIAGNOSIS — J449 Chronic obstructive pulmonary disease, unspecified: Secondary | ICD-10-CM | POA: Diagnosis not present

## 2020-03-03 DIAGNOSIS — I2510 Atherosclerotic heart disease of native coronary artery without angina pectoris: Secondary | ICD-10-CM | POA: Diagnosis not present

## 2020-03-03 DIAGNOSIS — D509 Iron deficiency anemia, unspecified: Secondary | ICD-10-CM | POA: Diagnosis not present

## 2020-03-03 DIAGNOSIS — E1122 Type 2 diabetes mellitus with diabetic chronic kidney disease: Secondary | ICD-10-CM | POA: Diagnosis not present

## 2020-03-04 DIAGNOSIS — Z7984 Long term (current) use of oral hypoglycemic drugs: Secondary | ICD-10-CM | POA: Diagnosis not present

## 2020-03-04 DIAGNOSIS — I6523 Occlusion and stenosis of bilateral carotid arteries: Secondary | ICD-10-CM | POA: Diagnosis not present

## 2020-03-04 DIAGNOSIS — I10 Essential (primary) hypertension: Secondary | ICD-10-CM | POA: Diagnosis not present

## 2020-03-04 DIAGNOSIS — E1122 Type 2 diabetes mellitus with diabetic chronic kidney disease: Secondary | ICD-10-CM | POA: Diagnosis not present

## 2020-03-04 DIAGNOSIS — R1310 Dysphagia, unspecified: Secondary | ICD-10-CM | POA: Diagnosis not present

## 2020-03-04 DIAGNOSIS — M48061 Spinal stenosis, lumbar region without neurogenic claudication: Secondary | ICD-10-CM | POA: Diagnosis not present

## 2020-03-04 DIAGNOSIS — N182 Chronic kidney disease, stage 2 (mild): Secondary | ICD-10-CM | POA: Diagnosis not present

## 2020-03-04 DIAGNOSIS — J449 Chronic obstructive pulmonary disease, unspecified: Secondary | ICD-10-CM | POA: Diagnosis not present

## 2020-03-04 DIAGNOSIS — G319 Degenerative disease of nervous system, unspecified: Secondary | ICD-10-CM | POA: Diagnosis not present

## 2020-03-04 DIAGNOSIS — I2510 Atherosclerotic heart disease of native coronary artery without angina pectoris: Secondary | ICD-10-CM | POA: Diagnosis not present

## 2020-03-04 DIAGNOSIS — M5136 Other intervertebral disc degeneration, lumbar region: Secondary | ICD-10-CM | POA: Diagnosis not present

## 2020-03-04 DIAGNOSIS — G8929 Other chronic pain: Secondary | ICD-10-CM | POA: Diagnosis not present

## 2020-03-04 DIAGNOSIS — D509 Iron deficiency anemia, unspecified: Secondary | ICD-10-CM | POA: Diagnosis not present

## 2020-03-05 DIAGNOSIS — K3189 Other diseases of stomach and duodenum: Secondary | ICD-10-CM | POA: Diagnosis not present

## 2020-03-05 DIAGNOSIS — R1013 Epigastric pain: Secondary | ICD-10-CM | POA: Diagnosis not present

## 2020-03-05 DIAGNOSIS — J449 Chronic obstructive pulmonary disease, unspecified: Secondary | ICD-10-CM | POA: Diagnosis not present

## 2020-03-05 DIAGNOSIS — E785 Hyperlipidemia, unspecified: Secondary | ICD-10-CM | POA: Diagnosis not present

## 2020-03-05 DIAGNOSIS — D509 Iron deficiency anemia, unspecified: Secondary | ICD-10-CM | POA: Diagnosis not present

## 2020-03-05 DIAGNOSIS — K219 Gastro-esophageal reflux disease without esophagitis: Secondary | ICD-10-CM | POA: Diagnosis not present

## 2020-03-05 DIAGNOSIS — Z86010 Personal history of colonic polyps: Secondary | ICD-10-CM | POA: Diagnosis not present

## 2020-03-05 DIAGNOSIS — I2510 Atherosclerotic heart disease of native coronary artery without angina pectoris: Secondary | ICD-10-CM | POA: Diagnosis not present

## 2020-03-05 DIAGNOSIS — I10 Essential (primary) hypertension: Secondary | ICD-10-CM | POA: Diagnosis not present

## 2020-03-05 DIAGNOSIS — E119 Type 2 diabetes mellitus without complications: Secondary | ICD-10-CM | POA: Diagnosis not present

## 2020-03-05 DIAGNOSIS — Z79899 Other long term (current) drug therapy: Secondary | ICD-10-CM | POA: Diagnosis not present

## 2020-03-06 DIAGNOSIS — G8929 Other chronic pain: Secondary | ICD-10-CM | POA: Diagnosis not present

## 2020-03-06 DIAGNOSIS — M5136 Other intervertebral disc degeneration, lumbar region: Secondary | ICD-10-CM | POA: Diagnosis not present

## 2020-03-06 DIAGNOSIS — I10 Essential (primary) hypertension: Secondary | ICD-10-CM | POA: Diagnosis not present

## 2020-03-06 DIAGNOSIS — E1122 Type 2 diabetes mellitus with diabetic chronic kidney disease: Secondary | ICD-10-CM | POA: Diagnosis not present

## 2020-03-06 DIAGNOSIS — I6523 Occlusion and stenosis of bilateral carotid arteries: Secondary | ICD-10-CM | POA: Diagnosis not present

## 2020-03-06 DIAGNOSIS — J449 Chronic obstructive pulmonary disease, unspecified: Secondary | ICD-10-CM | POA: Diagnosis not present

## 2020-03-06 DIAGNOSIS — R1310 Dysphagia, unspecified: Secondary | ICD-10-CM | POA: Diagnosis not present

## 2020-03-06 DIAGNOSIS — I2510 Atherosclerotic heart disease of native coronary artery without angina pectoris: Secondary | ICD-10-CM | POA: Diagnosis not present

## 2020-03-06 DIAGNOSIS — Z7984 Long term (current) use of oral hypoglycemic drugs: Secondary | ICD-10-CM | POA: Diagnosis not present

## 2020-03-06 DIAGNOSIS — G319 Degenerative disease of nervous system, unspecified: Secondary | ICD-10-CM | POA: Diagnosis not present

## 2020-03-06 DIAGNOSIS — N182 Chronic kidney disease, stage 2 (mild): Secondary | ICD-10-CM | POA: Diagnosis not present

## 2020-03-06 DIAGNOSIS — M48061 Spinal stenosis, lumbar region without neurogenic claudication: Secondary | ICD-10-CM | POA: Diagnosis not present

## 2020-03-06 DIAGNOSIS — D509 Iron deficiency anemia, unspecified: Secondary | ICD-10-CM | POA: Diagnosis not present

## 2020-03-10 DIAGNOSIS — N182 Chronic kidney disease, stage 2 (mild): Secondary | ICD-10-CM | POA: Diagnosis not present

## 2020-03-10 DIAGNOSIS — G319 Degenerative disease of nervous system, unspecified: Secondary | ICD-10-CM | POA: Diagnosis not present

## 2020-03-10 DIAGNOSIS — I2510 Atherosclerotic heart disease of native coronary artery without angina pectoris: Secondary | ICD-10-CM | POA: Diagnosis not present

## 2020-03-10 DIAGNOSIS — D509 Iron deficiency anemia, unspecified: Secondary | ICD-10-CM | POA: Diagnosis not present

## 2020-03-10 DIAGNOSIS — Z7984 Long term (current) use of oral hypoglycemic drugs: Secondary | ICD-10-CM | POA: Diagnosis not present

## 2020-03-10 DIAGNOSIS — I10 Essential (primary) hypertension: Secondary | ICD-10-CM | POA: Diagnosis not present

## 2020-03-10 DIAGNOSIS — R1310 Dysphagia, unspecified: Secondary | ICD-10-CM | POA: Diagnosis not present

## 2020-03-10 DIAGNOSIS — G8929 Other chronic pain: Secondary | ICD-10-CM | POA: Diagnosis not present

## 2020-03-10 DIAGNOSIS — E1122 Type 2 diabetes mellitus with diabetic chronic kidney disease: Secondary | ICD-10-CM | POA: Diagnosis not present

## 2020-03-10 DIAGNOSIS — I6523 Occlusion and stenosis of bilateral carotid arteries: Secondary | ICD-10-CM | POA: Diagnosis not present

## 2020-03-10 DIAGNOSIS — M5136 Other intervertebral disc degeneration, lumbar region: Secondary | ICD-10-CM | POA: Diagnosis not present

## 2020-03-10 DIAGNOSIS — J449 Chronic obstructive pulmonary disease, unspecified: Secondary | ICD-10-CM | POA: Diagnosis not present

## 2020-03-10 DIAGNOSIS — M48061 Spinal stenosis, lumbar region without neurogenic claudication: Secondary | ICD-10-CM | POA: Diagnosis not present

## 2020-03-11 DIAGNOSIS — G319 Degenerative disease of nervous system, unspecified: Secondary | ICD-10-CM | POA: Diagnosis not present

## 2020-03-11 DIAGNOSIS — N182 Chronic kidney disease, stage 2 (mild): Secondary | ICD-10-CM | POA: Diagnosis not present

## 2020-03-11 DIAGNOSIS — M48061 Spinal stenosis, lumbar region without neurogenic claudication: Secondary | ICD-10-CM | POA: Diagnosis not present

## 2020-03-11 DIAGNOSIS — Z7984 Long term (current) use of oral hypoglycemic drugs: Secondary | ICD-10-CM | POA: Diagnosis not present

## 2020-03-11 DIAGNOSIS — E1122 Type 2 diabetes mellitus with diabetic chronic kidney disease: Secondary | ICD-10-CM | POA: Diagnosis not present

## 2020-03-11 DIAGNOSIS — I2510 Atherosclerotic heart disease of native coronary artery without angina pectoris: Secondary | ICD-10-CM | POA: Diagnosis not present

## 2020-03-11 DIAGNOSIS — R1310 Dysphagia, unspecified: Secondary | ICD-10-CM | POA: Diagnosis not present

## 2020-03-11 DIAGNOSIS — I6523 Occlusion and stenosis of bilateral carotid arteries: Secondary | ICD-10-CM | POA: Diagnosis not present

## 2020-03-11 DIAGNOSIS — D509 Iron deficiency anemia, unspecified: Secondary | ICD-10-CM | POA: Diagnosis not present

## 2020-03-11 DIAGNOSIS — G8929 Other chronic pain: Secondary | ICD-10-CM | POA: Diagnosis not present

## 2020-03-11 DIAGNOSIS — I10 Essential (primary) hypertension: Secondary | ICD-10-CM | POA: Diagnosis not present

## 2020-03-11 DIAGNOSIS — M5136 Other intervertebral disc degeneration, lumbar region: Secondary | ICD-10-CM | POA: Diagnosis not present

## 2020-03-11 DIAGNOSIS — J449 Chronic obstructive pulmonary disease, unspecified: Secondary | ICD-10-CM | POA: Diagnosis not present

## 2020-03-12 DIAGNOSIS — N183 Chronic kidney disease, stage 3 unspecified: Secondary | ICD-10-CM | POA: Diagnosis not present

## 2020-03-12 DIAGNOSIS — E1122 Type 2 diabetes mellitus with diabetic chronic kidney disease: Secondary | ICD-10-CM | POA: Diagnosis not present

## 2020-03-12 DIAGNOSIS — I2510 Atherosclerotic heart disease of native coronary artery without angina pectoris: Secondary | ICD-10-CM | POA: Diagnosis not present

## 2020-03-12 DIAGNOSIS — D509 Iron deficiency anemia, unspecified: Secondary | ICD-10-CM | POA: Diagnosis not present

## 2020-03-12 DIAGNOSIS — I10 Essential (primary) hypertension: Secondary | ICD-10-CM | POA: Diagnosis not present

## 2020-03-12 DIAGNOSIS — J449 Chronic obstructive pulmonary disease, unspecified: Secondary | ICD-10-CM | POA: Diagnosis not present

## 2020-03-12 DIAGNOSIS — I6523 Occlusion and stenosis of bilateral carotid arteries: Secondary | ICD-10-CM | POA: Diagnosis not present

## 2020-03-14 DIAGNOSIS — N182 Chronic kidney disease, stage 2 (mild): Secondary | ICD-10-CM | POA: Diagnosis not present

## 2020-03-14 DIAGNOSIS — M48061 Spinal stenosis, lumbar region without neurogenic claudication: Secondary | ICD-10-CM | POA: Diagnosis not present

## 2020-03-14 DIAGNOSIS — M5136 Other intervertebral disc degeneration, lumbar region: Secondary | ICD-10-CM | POA: Diagnosis not present

## 2020-03-14 DIAGNOSIS — G8929 Other chronic pain: Secondary | ICD-10-CM | POA: Diagnosis not present

## 2020-03-14 DIAGNOSIS — I2510 Atherosclerotic heart disease of native coronary artery without angina pectoris: Secondary | ICD-10-CM | POA: Diagnosis not present

## 2020-03-14 DIAGNOSIS — E1122 Type 2 diabetes mellitus with diabetic chronic kidney disease: Secondary | ICD-10-CM | POA: Diagnosis not present

## 2020-03-14 DIAGNOSIS — D509 Iron deficiency anemia, unspecified: Secondary | ICD-10-CM | POA: Diagnosis not present

## 2020-03-14 DIAGNOSIS — G319 Degenerative disease of nervous system, unspecified: Secondary | ICD-10-CM | POA: Diagnosis not present

## 2020-03-14 DIAGNOSIS — Z7984 Long term (current) use of oral hypoglycemic drugs: Secondary | ICD-10-CM | POA: Diagnosis not present

## 2020-03-14 DIAGNOSIS — I6523 Occlusion and stenosis of bilateral carotid arteries: Secondary | ICD-10-CM | POA: Diagnosis not present

## 2020-03-14 DIAGNOSIS — R1310 Dysphagia, unspecified: Secondary | ICD-10-CM | POA: Diagnosis not present

## 2020-03-14 DIAGNOSIS — J449 Chronic obstructive pulmonary disease, unspecified: Secondary | ICD-10-CM | POA: Diagnosis not present

## 2020-03-14 DIAGNOSIS — I10 Essential (primary) hypertension: Secondary | ICD-10-CM | POA: Diagnosis not present

## 2020-03-16 DIAGNOSIS — M48061 Spinal stenosis, lumbar region without neurogenic claudication: Secondary | ICD-10-CM | POA: Diagnosis not present

## 2020-03-16 DIAGNOSIS — E1122 Type 2 diabetes mellitus with diabetic chronic kidney disease: Secondary | ICD-10-CM | POA: Diagnosis not present

## 2020-03-16 DIAGNOSIS — J449 Chronic obstructive pulmonary disease, unspecified: Secondary | ICD-10-CM | POA: Diagnosis not present

## 2020-03-16 DIAGNOSIS — D509 Iron deficiency anemia, unspecified: Secondary | ICD-10-CM | POA: Diagnosis not present

## 2020-03-16 DIAGNOSIS — Z7984 Long term (current) use of oral hypoglycemic drugs: Secondary | ICD-10-CM | POA: Diagnosis not present

## 2020-03-16 DIAGNOSIS — I2510 Atherosclerotic heart disease of native coronary artery without angina pectoris: Secondary | ICD-10-CM | POA: Diagnosis not present

## 2020-03-16 DIAGNOSIS — R1310 Dysphagia, unspecified: Secondary | ICD-10-CM | POA: Diagnosis not present

## 2020-03-16 DIAGNOSIS — G319 Degenerative disease of nervous system, unspecified: Secondary | ICD-10-CM | POA: Diagnosis not present

## 2020-03-16 DIAGNOSIS — I10 Essential (primary) hypertension: Secondary | ICD-10-CM | POA: Diagnosis not present

## 2020-03-16 DIAGNOSIS — N182 Chronic kidney disease, stage 2 (mild): Secondary | ICD-10-CM | POA: Diagnosis not present

## 2020-03-16 DIAGNOSIS — I6523 Occlusion and stenosis of bilateral carotid arteries: Secondary | ICD-10-CM | POA: Diagnosis not present

## 2020-03-16 DIAGNOSIS — M5136 Other intervertebral disc degeneration, lumbar region: Secondary | ICD-10-CM | POA: Diagnosis not present

## 2020-03-16 DIAGNOSIS — G8929 Other chronic pain: Secondary | ICD-10-CM | POA: Diagnosis not present

## 2020-03-17 DIAGNOSIS — G8929 Other chronic pain: Secondary | ICD-10-CM | POA: Diagnosis not present

## 2020-03-17 DIAGNOSIS — M48061 Spinal stenosis, lumbar region without neurogenic claudication: Secondary | ICD-10-CM | POA: Diagnosis not present

## 2020-03-17 DIAGNOSIS — D509 Iron deficiency anemia, unspecified: Secondary | ICD-10-CM | POA: Diagnosis not present

## 2020-03-17 DIAGNOSIS — I10 Essential (primary) hypertension: Secondary | ICD-10-CM | POA: Diagnosis not present

## 2020-03-17 DIAGNOSIS — E1122 Type 2 diabetes mellitus with diabetic chronic kidney disease: Secondary | ICD-10-CM | POA: Diagnosis not present

## 2020-03-17 DIAGNOSIS — I2510 Atherosclerotic heart disease of native coronary artery without angina pectoris: Secondary | ICD-10-CM | POA: Diagnosis not present

## 2020-03-17 DIAGNOSIS — G319 Degenerative disease of nervous system, unspecified: Secondary | ICD-10-CM | POA: Diagnosis not present

## 2020-03-17 DIAGNOSIS — J449 Chronic obstructive pulmonary disease, unspecified: Secondary | ICD-10-CM | POA: Diagnosis not present

## 2020-03-17 DIAGNOSIS — N182 Chronic kidney disease, stage 2 (mild): Secondary | ICD-10-CM | POA: Diagnosis not present

## 2020-03-17 DIAGNOSIS — M5136 Other intervertebral disc degeneration, lumbar region: Secondary | ICD-10-CM | POA: Diagnosis not present

## 2020-03-17 DIAGNOSIS — R1310 Dysphagia, unspecified: Secondary | ICD-10-CM | POA: Diagnosis not present

## 2020-03-17 DIAGNOSIS — Z7984 Long term (current) use of oral hypoglycemic drugs: Secondary | ICD-10-CM | POA: Diagnosis not present

## 2020-03-17 DIAGNOSIS — I6523 Occlusion and stenosis of bilateral carotid arteries: Secondary | ICD-10-CM | POA: Diagnosis not present

## 2020-03-18 DIAGNOSIS — R6881 Early satiety: Secondary | ICD-10-CM | POA: Diagnosis not present

## 2020-03-18 DIAGNOSIS — D509 Iron deficiency anemia, unspecified: Secondary | ICD-10-CM | POA: Diagnosis not present

## 2020-03-20 DIAGNOSIS — R109 Unspecified abdominal pain: Secondary | ICD-10-CM | POA: Diagnosis not present

## 2020-03-20 DIAGNOSIS — K5989 Other specified functional intestinal disorders: Secondary | ICD-10-CM | POA: Diagnosis not present

## 2020-03-20 DIAGNOSIS — R6881 Early satiety: Secondary | ICD-10-CM | POA: Diagnosis not present

## 2020-03-20 DIAGNOSIS — R110 Nausea: Secondary | ICD-10-CM | POA: Diagnosis not present

## 2020-03-22 ENCOUNTER — Other Ambulatory Visit: Payer: Self-pay

## 2020-03-22 ENCOUNTER — Encounter: Payer: Self-pay | Admitting: Emergency Medicine

## 2020-03-22 ENCOUNTER — Inpatient Hospital Stay
Admission: EM | Admit: 2020-03-22 | Discharge: 2020-03-24 | DRG: 282 | Disposition: A | Discharge status: Home / Self Care | Payer: Medicare Other | Attending: Internal Medicine | Admitting: Internal Medicine

## 2020-03-22 ENCOUNTER — Observation Stay
Admit: 2020-03-22 | Discharge: 2020-03-22 | Disposition: A | Discharge status: Home / Self Care | Payer: Medicare Other | Attending: Internal Medicine | Admitting: Internal Medicine

## 2020-03-22 ENCOUNTER — Emergency Department: Payer: Medicare Other

## 2020-03-22 DIAGNOSIS — Z20822 Contact with and (suspected) exposure to covid-19: Secondary | ICD-10-CM | POA: Diagnosis not present

## 2020-03-22 DIAGNOSIS — G459 Transient cerebral ischemic attack, unspecified: Secondary | ICD-10-CM | POA: Diagnosis not present

## 2020-03-22 DIAGNOSIS — K219 Gastro-esophageal reflux disease without esophagitis: Secondary | ICD-10-CM | POA: Diagnosis present

## 2020-03-22 DIAGNOSIS — Z833 Family history of diabetes mellitus: Secondary | ICD-10-CM

## 2020-03-22 DIAGNOSIS — Z7951 Long term (current) use of inhaled steroids: Secondary | ICD-10-CM

## 2020-03-22 DIAGNOSIS — Z7984 Long term (current) use of oral hypoglycemic drugs: Secondary | ICD-10-CM

## 2020-03-22 DIAGNOSIS — I214 Non-ST elevation (NSTEMI) myocardial infarction: Secondary | ICD-10-CM | POA: Diagnosis not present

## 2020-03-22 DIAGNOSIS — J449 Chronic obstructive pulmonary disease, unspecified: Secondary | ICD-10-CM | POA: Diagnosis present

## 2020-03-22 DIAGNOSIS — E785 Hyperlipidemia, unspecified: Secondary | ICD-10-CM | POA: Diagnosis not present

## 2020-03-22 DIAGNOSIS — I200 Unstable angina: Secondary | ICD-10-CM | POA: Diagnosis not present

## 2020-03-22 DIAGNOSIS — I2510 Atherosclerotic heart disease of native coronary artery without angina pectoris: Secondary | ICD-10-CM | POA: Diagnosis present

## 2020-03-22 DIAGNOSIS — Z888 Allergy status to other drugs, medicaments and biological substances status: Secondary | ICD-10-CM | POA: Diagnosis not present

## 2020-03-22 DIAGNOSIS — K319 Disease of stomach and duodenum, unspecified: Secondary | ICD-10-CM | POA: Diagnosis present

## 2020-03-22 DIAGNOSIS — I208 Other forms of angina pectoris: Secondary | ICD-10-CM | POA: Diagnosis not present

## 2020-03-22 DIAGNOSIS — E119 Type 2 diabetes mellitus without complications: Secondary | ICD-10-CM | POA: Diagnosis not present

## 2020-03-22 DIAGNOSIS — R079 Chest pain, unspecified: Secondary | ICD-10-CM | POA: Diagnosis present

## 2020-03-22 DIAGNOSIS — R7989 Other specified abnormal findings of blood chemistry: Secondary | ICD-10-CM | POA: Diagnosis present

## 2020-03-22 DIAGNOSIS — Z885 Allergy status to narcotic agent status: Secondary | ICD-10-CM | POA: Diagnosis not present

## 2020-03-22 DIAGNOSIS — I25110 Atherosclerotic heart disease of native coronary artery with unstable angina pectoris: Secondary | ICD-10-CM | POA: Diagnosis not present

## 2020-03-22 DIAGNOSIS — F17210 Nicotine dependence, cigarettes, uncomplicated: Secondary | ICD-10-CM | POA: Diagnosis present

## 2020-03-22 DIAGNOSIS — Z886 Allergy status to analgesic agent status: Secondary | ICD-10-CM

## 2020-03-22 DIAGNOSIS — Z91041 Radiographic dye allergy status: Secondary | ICD-10-CM | POA: Diagnosis not present

## 2020-03-22 DIAGNOSIS — R0602 Shortness of breath: Secondary | ICD-10-CM | POA: Diagnosis not present

## 2020-03-22 DIAGNOSIS — I10 Essential (primary) hypertension: Secondary | ICD-10-CM | POA: Diagnosis present

## 2020-03-22 DIAGNOSIS — R778 Other specified abnormalities of plasma proteins: Secondary | ICD-10-CM | POA: Diagnosis not present

## 2020-03-22 DIAGNOSIS — R072 Precordial pain: Secondary | ICD-10-CM | POA: Diagnosis not present

## 2020-03-22 DIAGNOSIS — Z720 Tobacco use: Secondary | ICD-10-CM

## 2020-03-22 DIAGNOSIS — Z8673 Personal history of transient ischemic attack (TIA), and cerebral infarction without residual deficits: Secondary | ICD-10-CM

## 2020-03-22 DIAGNOSIS — Z79899 Other long term (current) drug therapy: Secondary | ICD-10-CM | POA: Diagnosis not present

## 2020-03-22 DIAGNOSIS — I209 Angina pectoris, unspecified: Secondary | ICD-10-CM | POA: Diagnosis not present

## 2020-03-22 LAB — CBC
HCT: 34.5 % — ABNORMAL LOW (ref 39.0–52.0)
Hemoglobin: 10.5 g/dL — ABNORMAL LOW (ref 13.0–17.0)
MCH: 23.2 pg — ABNORMAL LOW (ref 26.0–34.0)
MCHC: 30.4 g/dL (ref 30.0–36.0)
MCV: 76.3 fL — ABNORMAL LOW (ref 80.0–100.0)
Platelets: 211 10*3/uL (ref 150–400)
RBC: 4.52 MIL/uL (ref 4.22–5.81)
RDW: 19.1 % — ABNORMAL HIGH (ref 11.5–15.5)
WBC: 6.2 10*3/uL (ref 4.0–10.5)
nRBC: 0 % (ref 0.0–0.2)

## 2020-03-22 LAB — HEPARIN LEVEL (UNFRACTIONATED)
Heparin Unfractionated: 0.33 IU/mL (ref 0.30–0.70)
Heparin Unfractionated: 0.35 IU/mL (ref 0.30–0.70)

## 2020-03-22 LAB — BASIC METABOLIC PANEL
Anion gap: 7 (ref 5–15)
BUN: 20 mg/dL (ref 8–23)
CO2: 28 mmol/L (ref 22–32)
Calcium: 8.5 mg/dL — ABNORMAL LOW (ref 8.9–10.3)
Chloride: 104 mmol/L (ref 98–111)
Creatinine, Ser: 1.02 mg/dL (ref 0.61–1.24)
GFR calc Af Amer: >60 mL/min (ref >60)
GFR calc non Af Amer: >60 mL/min (ref >60)
Glucose, Bld: 102 mg/dL — ABNORMAL HIGH (ref 70–99)
Potassium: 4.1 mmol/L (ref 3.5–5.1)
Sodium: 139 mmol/L (ref 135–145)

## 2020-03-22 LAB — SARS CORONAVIRUS 2 BY RT PCR (HOSPITAL ORDER, PERFORMED IN ~~LOC~~ HOSPITAL LAB): SARS Coronavirus 2: NEGATIVE

## 2020-03-22 LAB — GLUCOSE, CAPILLARY
Glucose-Capillary: 117 mg/dL — ABNORMAL HIGH (ref 70–99)
Glucose-Capillary: 84 mg/dL (ref 70–99)
Glucose-Capillary: 81 mg/dL (ref 70–99)

## 2020-03-22 LAB — TROPONIN I (HIGH SENSITIVITY)
Troponin I (High Sensitivity): 116 ng/L (ref <18)
Troponin I (High Sensitivity): 103 ng/L (ref <18)
Troponin I (High Sensitivity): 102 ng/L (ref <18)
Troponin I (High Sensitivity): 110 ng/L (ref <18)
Troponin I (High Sensitivity): 112 ng/L (ref <18)

## 2020-03-22 LAB — PROTIME-INR
INR: 1.1 (ref 0.8–1.2)
Prothrombin Time: 13.5 seconds (ref 11.4–15.2)

## 2020-03-22 LAB — URINE DRUG SCREEN, QUALITATIVE (ARMC ONLY)
Amphetamines, Ur Screen: NOT DETECTED
Barbiturates, Ur Screen: NOT DETECTED
Benzodiazepine, Ur Scrn: NOT DETECTED
Cannabinoid 50 Ng, Ur ~~LOC~~: NOT DETECTED
Cocaine Metabolite,Ur ~~LOC~~: NOT DETECTED
MDMA (Ecstasy)Ur Screen: NOT DETECTED
Methadone Scn, Ur: NOT DETECTED
Opiate, Ur Screen: NOT DETECTED
Phencyclidine (PCP) Ur S: NOT DETECTED
Tricyclic, Ur Screen: NOT DETECTED

## 2020-03-22 LAB — HEMOGLOBIN A1C
Hgb A1c MFr Bld: 6.2 % — ABNORMAL HIGH (ref 4.8–5.6)
Mean Plasma Glucose: 131.24 mg/dL

## 2020-03-22 LAB — ECHOCARDIOGRAM COMPLETE
Height: 68 in
Weight: 2272 oz

## 2020-03-22 LAB — APTT: aPTT: 27 seconds (ref 24–36)

## 2020-03-22 MED ORDER — PANTOPRAZOLE SODIUM 40 MG PO TBEC
40.0000 mg | DELAYED_RELEASE_TABLET | Freq: Every day | ORAL | Status: DC
  Administered 2020-03-23 – 2020-03-24 (×2): 40 mg via ORAL
  Filled 2020-03-22 (×2): qty 1

## 2020-03-22 MED ORDER — AMLODIPINE BESYLATE 10 MG PO TABS
10.0000 mg | ORAL_TABLET | Freq: Every day | ORAL | Status: DC
  Administered 2020-03-22 – 2020-03-24 (×3): 10 mg via ORAL
  Filled 2020-03-22 (×3): qty 1

## 2020-03-22 MED ORDER — HEPARIN (PORCINE) 25000 UT/250ML-% IV SOLN
850.0000 [IU]/h | INTRAVENOUS | Status: DC
  Administered 2020-03-22 – 2020-03-23 (×2): 900 [IU]/h via INTRAVENOUS
  Administered 2020-03-24: 850 [IU]/h via INTRAVENOUS
  Filled 2020-03-22 (×3): qty 250

## 2020-03-22 MED ORDER — TRAZODONE HCL 50 MG PO TABS
50.0000 mg | ORAL_TABLET | Freq: Every day | ORAL | Status: DC
  Administered 2020-03-22 – 2020-03-23 (×2): 50 mg via ORAL
  Filled 2020-03-22 (×2): qty 1

## 2020-03-22 MED ORDER — METOPROLOL SUCCINATE ER 25 MG PO TB24
12.5000 mg | ORAL_TABLET | Freq: Every day | ORAL | Status: DC
  Administered 2020-03-22 – 2020-03-24 (×3): 12.5 mg via ORAL
  Filled 2020-03-22 (×3): qty 1

## 2020-03-22 MED ORDER — HEPARIN BOLUS VIA INFUSION
3800.0000 [IU] | Freq: Once | INTRAVENOUS | Status: AC
  Administered 2020-03-22: 3800 [IU] via INTRAVENOUS
  Filled 2020-03-22: qty 3800

## 2020-03-22 MED ORDER — ASPIRIN 81 MG PO CHEW
324.0000 mg | CHEWABLE_TABLET | Freq: Once | ORAL | Status: AC
  Administered 2020-03-22: 324 mg via ORAL
  Filled 2020-03-22: qty 4

## 2020-03-22 MED ORDER — ATORVASTATIN CALCIUM 20 MG PO TABS
40.0000 mg | ORAL_TABLET | Freq: Every day | ORAL | Status: DC
  Administered 2020-03-22 – 2020-03-24 (×3): 40 mg via ORAL
  Filled 2020-03-22 (×3): qty 2

## 2020-03-22 MED ORDER — MORPHINE SULFATE (PF) 2 MG/ML IV SOLN
1.0000 mg | INTRAVENOUS | Status: DC | PRN
  Administered 2020-03-22 – 2020-03-24 (×5): 1 mg via INTRAVENOUS
  Filled 2020-03-22 (×5): qty 1

## 2020-03-22 MED ORDER — ONDANSETRON HCL 4 MG/2ML IJ SOLN
4.0000 mg | Freq: Three times a day (TID) | INTRAMUSCULAR | Status: DC | PRN
  Administered 2020-03-22: 4 mg via INTRAVENOUS
  Filled 2020-03-22: qty 2

## 2020-03-22 MED ORDER — INSULIN ASPART 100 UNIT/ML ~~LOC~~ SOLN: 0.0000 [IU] | Freq: Every day | SUBCUTANEOUS | Status: DC

## 2020-03-22 MED ORDER — INSULIN ASPART 100 UNIT/ML ~~LOC~~ SOLN
0.0000 [IU] | Freq: Three times a day (TID) | SUBCUTANEOUS | Status: DC
  Administered 2020-03-23: 1 [IU] via SUBCUTANEOUS
  Filled 2020-03-22: qty 1

## 2020-03-22 MED ORDER — MOMETASONE FURO-FORMOTEROL FUM 200-5 MCG/ACT IN AERO
2.0000 | INHALATION_SPRAY | Freq: Two times a day (BID) | RESPIRATORY_TRACT | Status: DC
  Administered 2020-03-22 – 2020-03-24 (×4): 2 via RESPIRATORY_TRACT
  Filled 2020-03-22: qty 8.8

## 2020-03-22 MED ORDER — ALBUTEROL SULFATE (2.5 MG/3ML) 0.083% IN NEBU: 2.5000 mg | INHALATION_SOLUTION | RESPIRATORY_TRACT | Status: DC | PRN

## 2020-03-22 MED ORDER — FERROUS SULFATE 325 (65 FE) MG PO TABS
325.0000 mg | ORAL_TABLET | ORAL | Status: DC
  Administered 2020-03-23: 325 mg via ORAL
  Filled 2020-03-22 (×2): qty 1

## 2020-03-22 MED ORDER — ACETAMINOPHEN 325 MG PO TABS: 650.0000 mg | ORAL_TABLET | Freq: Four times a day (QID) | ORAL | Status: DC | PRN

## 2020-03-22 MED ORDER — ALPRAZOLAM 0.25 MG PO TABS
0.2500 mg | ORAL_TABLET | Freq: Three times a day (TID) | ORAL | Status: DC | PRN
  Administered 2020-03-22 – 2020-03-23 (×3): 0.25 mg via ORAL
  Filled 2020-03-22 (×3): qty 1

## 2020-03-22 MED ORDER — NITROGLYCERIN 0.4 MG SL SUBL
0.4000 mg | SUBLINGUAL_TABLET | SUBLINGUAL | Status: DC | PRN
  Administered 2020-03-22: 0.4 mg via SUBLINGUAL
  Filled 2020-03-22: qty 1

## 2020-03-22 MED ORDER — GABAPENTIN 100 MG PO CAPS
100.0000 mg | ORAL_CAPSULE | Freq: Two times a day (BID) | ORAL | Status: DC
  Administered 2020-03-22 – 2020-03-23 (×3): 100 mg via ORAL
  Filled 2020-03-22 (×3): qty 1

## 2020-03-22 MED ORDER — HYDRALAZINE HCL 20 MG/ML IJ SOLN: 5.0000 mg | INTRAMUSCULAR | Status: DC | PRN

## 2020-03-22 MED ORDER — NICOTINE 21 MG/24HR TD PT24
21.0000 mg | MEDICATED_PATCH | Freq: Every day | TRANSDERMAL | Status: DC
  Administered 2020-03-22 – 2020-03-24 (×3): 21 mg via TRANSDERMAL
  Filled 2020-03-22 (×3): qty 1

## 2020-03-22 NOTE — Progress Notes (Signed)
*  PRELIMINARY RESULTS* Echocardiogram 2D Echocardiogram has been performed.  [name] [name] 03/22/2020, 3:21 PM

## 2020-03-22 NOTE — Progress Notes (Signed)
ANTICOAGULATION CONSULT NOTE - Initial Consult  Pharmacy Consult for heparin Indication: chest pain/ACS  Allergies  Allergen Reactions  . Lisinopril Swelling    Lip and check swelling  . Aspirin     Held due to anemia and fall risk.  . Codeine   . Ivp Dye [Iodinated Diagnostic Agents] Hives and Other (See Comments)    dizzy  . Percocet [Oxycodone-Acetaminophen] Hives and Nausea Only    Patient Measurements: Height: 5\' 8"  (172.7 cm) Weight: 64.4 kg (142 lb) IBW/kg (Calculated) : 68.4 Heparin Dosing Weight: 64.4 kg  Vital Signs: Temp: 97.8 F (36.6 C) (07/04 1122) Temp Source: Oral (07/04 0906) BP: 163/92 (07/04 1122) Pulse Rate: 83 (07/04 1122)  Labs: Recent Labs    03/22/20 0442 03/22/20 0442 03/22/20 0625 03/22/20 0708 03/22/20 0804 03/22/20 1054 03/22/20 1442  HGB 10.5*  --   --   --   --   --   --   HCT 34.5*  --   --   --   --   --   --   PLT 211  --   --   --   --   --   --   APTT  --   --   --  27  --   --   --   LABPROT  --   --   --  13.5  --   --   --   INR  --   --   --  1.1  --   --   --   HEPARINUNFRC  --   --   --   --   --   --  0.33  CREATININE 1.02  --   --   --   --   --   --   TROPONINIHS 116*   < > 103*  --  102* 110*  --    < > = values in this interval not displayed.    Estimated Creatinine Clearance: 55.2 mL/min (by C-G formula based on SCr of 1.02 mg/dL).   Medical History: Past Medical History:  Diagnosis Date  . Atypical chest pain    a. 05/2011 Neg MV; b. 04/2013 Neg MV; c. 04/2019 Echo: EF 55-60%, mild BAE. Degen MV dzs. Nl RV fxn; d. 06/2019 MV: EF 55%, small, mild, fixed apical and apical ant defect - probable artifact. No ischemia.  Marland Kitchen COPD (chronic obstructive pulmonary disease) ([place])   . Coronary artery calcification seen on CT scan    a. 07/2018 CT Chest - cor Ca2+.  . Diabetes mellitus without complication ([place])   . Erosive gastropathy    a. 06/2019 EGD  . GERD (gastroesophageal reflux disease)   . Hypertension   .  Pulmonary nodules    a. 07/2018 Chest CT: RML and RLL nodules.  Marland Kitchen TIA (transient ischemic attack)     Medications:  Scheduled:  . insulin aspart  0-5 Units Subcutaneous QHS  . insulin aspart  0-9 Units Subcutaneous TID WC  . nicotine  21 mg Transdermal Daily    Assessment: Patient w/ h/o CAD, HTN, PVC's arrives w/ troponin of 116 w/ EKG showing SR w/ PVCs. Baseline CBC WNL, aPTT/INR pending. Patient not on PTA anticoagulation. Patient is being started on heparin for management of NSTEMI.  7/4 AM: Heparin infusion started @ 900 units/hr  Goal of Therapy:  Heparin level 0.3-0.7 units/ml Monitor platelets by anticoagulation protocol: Yes   Plan:  7/4 @ 1442 HL: 0.33; Level is therapeutic  x 1. Will continue current heparin infusion rate of 900 units/hr and will recheck confirmatory  anti-Xa in 6 hours.  Will monitor daily CBC's and will adjust rate per anti-Xa levels.  Pernell Dupre, PharmD, BCPS Clinical Pharmacist 03/22/2020 3:16 PM

## 2020-03-22 NOTE — Progress Notes (Signed)
ANTICOAGULATION CONSULT NOTE - Initial Consult  Pharmacy Consult for heparin Indication: chest pain/ACS  Allergies  Allergen Reactions  . Codeine   . Ivp Dye [Iodinated Diagnostic Agents] Hives and Other (See Comments)    dizzy  . Percocet [Oxycodone-Acetaminophen] Hives and Nausea Only    Patient Measurements: Height: 5\' 8"  (172.7 cm) Weight: 64.4 kg (142 lb) IBW/kg (Calculated) : 68.4 Heparin Dosing Weight: 64.4 kg  Vital Signs: Temp: 97.7 F (36.5 C) (07/04 0439) Temp Source: Oral (07/04 0439) BP: 166/95 (07/04 0619) Pulse Rate: 93 (07/04 0619)  Labs: Recent Labs    03/22/20 0442  HGB 10.5*  HCT 34.5*  PLT 211  CREATININE 1.02  TROPONINIHS 116*    Estimated Creatinine Clearance: 55.2 mL/min (by C-G formula based on SCr of 1.02 mg/dL).   Medical History: Past Medical History:  Diagnosis Date  . Atypical chest pain    a. 05/2011 Neg MV; b. 04/2013 Neg MV; c. 04/2019 Echo: EF 55-60%, mild BAE. Degen MV dzs. Nl RV fxn; d. 06/2019 MV: EF 55%, small, mild, fixed apical and apical ant defect - probable artifact. No ischemia.  Marland Kitchen COPD (chronic obstructive pulmonary disease) ([place])   . Coronary artery calcification seen on CT scan    a. 07/2018 CT Chest - cor Ca2+.  . Diabetes mellitus without complication ([place])   . Erosive gastropathy    a. 06/2019 EGD  . GERD (gastroesophageal reflux disease)   . Hypertension   . Pulmonary nodules    a. 07/2018 Chest CT: RML and RLL nodules.  Marland Kitchen TIA (transient ischemic attack)     Medications:  Scheduled:  . aspirin  324 mg Oral Once  . heparin  3,800 Units Intravenous Once    Assessment: Patient w/ h/o CAD, HTN, PVC's arrives w/ troponin of 116 w/ EKG showing SR w/ PVCs. Baseline CBC WNL, aPTT/INR pending. Patient not on PTA anticoagulation. Patient is being started on heparin for management of NSTEMI.  Goal of Therapy:  Heparin level 0.3-0.7 units/ml Monitor platelets by anticoagulation protocol: Yes   Plan:  Will bolus  heparin 3800 units IV x 1 Will start rate at 900 units/hr and will check anti-Xa at 1500. Will monitor daily CBC's and will adjust rate per anti-Xa levels.  Tobie Lords, PharmD, BCPS Clinical Pharmacist 03/22/2020,6:50 AM

## 2020-03-22 NOTE — ED Notes (Signed)
Called receiving floor. Left name and number. Awaiting return call.

## 2020-03-22 NOTE — Progress Notes (Signed)
ANTICOAGULATION CONSULT NOTE - Initial Consult  Pharmacy Consult for heparin Indication: chest pain/ACS  Allergies  Allergen Reactions  . Lisinopril Swelling    Lip and check swelling  . Aspirin     Held due to anemia and fall risk.  . Codeine   . Ivp Dye [Iodinated Diagnostic Agents] Hives and Other (See Comments)    dizzy  . Percocet [Oxycodone-Acetaminophen] Hives and Nausea Only    Patient Measurements: Height: 5\' 8"  (172.7 cm) Weight: 64.4 kg (142 lb) IBW/kg (Calculated) : 68.4 Heparin Dosing Weight: 64.4 kg  Vital Signs: Temp: 98.4 F (36.9 C) (07/04 2027) Temp Source: Oral (07/04 2027) BP: 149/87 (07/04 2027) Pulse Rate: 85 (07/04 2027)  Labs: Recent Labs    03/22/20 0442 03/22/20 0625 03/22/20 0708 03/22/20 0804 03/22/20 1054 03/22/20 1442 03/22/20 2055  HGB 10.5*  --   --   --   --   --   --   HCT 34.5*  --   --   --   --   --   --   PLT 211  --   --   --   --   --   --   APTT  --   --  27  --   --   --   --   LABPROT  --   --  13.5  --   --   --   --   INR  --   --  1.1  --   --   --   --   HEPARINUNFRC  --   --   --   --   --  0.33 0.35  CREATININE 1.02  --   --   --   --   --   --   TROPONINIHS 116*   < >  --  102* 110* 112*  --    < > = values in this interval not displayed.    Estimated Creatinine Clearance: 55.2 mL/min (by C-G formula based on SCr of 1.02 mg/dL).   Medical History: Past Medical History:  Diagnosis Date  . Atypical chest pain    a. 05/2011 Neg MV; b. 04/2013 Neg MV; c. 04/2019 Echo: EF 55-60%, mild BAE. Degen MV dzs. Nl RV fxn; d. 06/2019 MV: EF 55%, small, mild, fixed apical and apical ant defect - probable artifact. No ischemia.  Marland Kitchen COPD (chronic obstructive pulmonary disease) ([place])   . Coronary artery calcification seen on CT scan    a. 07/2018 CT Chest - cor Ca2+.  . Diabetes mellitus without complication ([place])   . Erosive gastropathy    a. 06/2019 EGD  . GERD (gastroesophageal reflux disease)   . Hypertension   .  Pulmonary nodules    a. 07/2018 Chest CT: RML and RLL nodules.  Marland Kitchen TIA (transient ischemic attack)     Medications:  Scheduled:  . amLODipine  10 mg Oral Daily  . atorvastatin  40 mg Oral Daily  . [START ON 03/23/2020] ferrous sulfate  325 mg Oral QODAY  . gabapentin  100 mg Oral BID  . insulin aspart  0-5 Units Subcutaneous QHS  . insulin aspart  0-9 Units Subcutaneous TID WC  . metoprolol succinate  12.5 mg Oral Daily  . mometasone-formoterol  2 puff Inhalation BID  . nicotine  21 mg Transdermal Daily  . pantoprazole  40 mg Oral Daily  . traZODone  50 mg Oral QHS    Assessment: Patient w/ h/o CAD, HTN, PVC's  arrives w/ troponin of 116 w/ EKG showing SR w/ PVCs. Baseline CBC WNL, aPTT/INR pending. Patient not on PTA anticoagulation. Patient is being started on heparin for management of NSTEMI.  7/4 AM: Heparin infusion started @ 900 units/hr  Goal of Therapy:  Heparin level 0.3-0.7 units/ml Monitor platelets by anticoagulation protocol: Yes   Plan:  7/4 @ 2100 HL: 0.35; therapeutic. Will continue current heparin infusion rate of 900 units/hr and will recheck HL w/ am labs.  Will monitor daily CBC's and will adjust rate per anti-Xa levels.  Tobie Lords, PharmD, BCPS Clinical Pharmacist 03/22/2020 11:59 PM

## 2020-03-22 NOTE — Consult Note (Signed)
CARDIOLOGY CONSULT NOTE               Patient IDKerby Pineda MRN: [id number] DOB/AGE: 02/20/1941 79 y.o.  Admit date: 03/22/2020 Referring Physician Dr [name]  hospitalist Primary Physician Dr [name] Primary Cardiologist none Reason for Consultation unstable angina  HPI: Patient presents with a history of recurrent chest pain symptoms patient has had what appears to be significant angina he has a history of hypertension diabetes COPD TIA GERD erosive gastric gastroscopy smoking patient had borderline troponins.  Patient was found to have hypertension.  Patient now presents for further evaluation after being evaluated and admitted to the emergency room with anginal symptoms.  History of diabetes coronary disease COPD now here for further evaluation and assessment  Review of systems complete and found to be negative unless listed above     Past Medical History:  Diagnosis Date  . Atypical chest pain    a. 05/2011 Neg MV; b. 04/2013 Neg MV; c. 04/2019 Echo: EF 55-60%, mild BAE. Degen MV dzs. Nl RV fxn; d. 06/2019 MV: EF 55%, small, mild, fixed apical and apical ant defect - probable artifact. No ischemia.  Marland Kitchen COPD (chronic obstructive pulmonary disease) ([place])   . Coronary artery calcification seen on CT scan    a. 07/2018 CT Chest - cor Ca2+.  . Diabetes mellitus without complication ([place])   . Erosive gastropathy    a. 06/2019 EGD  . GERD (gastroesophageal reflux disease)   . Hypertension   . Pulmonary nodules    a. 07/2018 Chest CT: RML and RLL nodules.  Marland Kitchen TIA (transient ischemic attack)     History reviewed. No pertinent surgical history.  Medications Prior to Admission  Medication Sig Dispense Refill Last Dose  . Acetaminophen 500 MG capsule Take by mouth.   Past Month at PRN  . albuterol (PROAIR HFA) 108 (90 Base) MCG/ACT inhaler Inhale 2 puffs every 6 (six) hours as needed into the lungs.     Marland Kitchen amLODipine (NORVASC) 10 MG tablet Take 1 tablet (10 mg total) by  mouth daily. 30 tablet 0   . atorvastatin (LIPITOR) 40 MG tablet Take 40 mg by mouth daily.     . budesonide-formoterol (SYMBICORT) 160-4.5 MCG/ACT inhaler Inhale 2 puffs into the lungs 2 (two) times daily.     . busPIRone (BUSPAR) 10 MG tablet Take 10 mg by mouth 2 (two) times daily as needed.    at PRN  . escitalopram (LEXAPRO) 10 MG tablet Take 10 mg by mouth daily.     . ferrous sulfate 325 (65 FE) MG tablet Take 325 mg by mouth every other day.     . gabapentin (NEURONTIN) 100 MG capsule Take 100 mg by mouth 2 (two) times daily.      . metFORMIN (GLUCOPHAGE) 850 MG tablet Take 850 mg by mouth daily with breakfast.      . metoprolol succinate (TOPROL-XL) 25 MG 24 hr tablet Take 12.5 mg by mouth daily.     . ondansetron (ZOFRAN ODT) 4 MG disintegrating tablet Take 1 tablet (4 mg total) by mouth every 8 (eight) hours as needed for nausea or vomiting. 20 tablet 0   . pantoprazole (PROTONIX) 40 MG tablet Take 40 mg by mouth daily.     . traZODone (DESYREL) 50 MG tablet Take 1 tablet (50 mg total) by mouth at bedtime. 3 tablet 0   . MELATONIN PO Take 5 mg by mouth at bedtime as needed.  Social History   Socioeconomic History  . Marital status: Widowed    Spouse name: Not on file  . Number of children: Not on file  . Years of education: Not on file  . Highest education level: Not on file  Occupational History  . Not on file  Tobacco Use  . Smoking status: Current Every Day Smoker    Packs/day: 0.50    Years: 60.00    Pack years: 30.00  . Smokeless tobacco: Never Used  . Tobacco comment: still smoking 1/2 ppd.  Substance and Sexual Activity  . Alcohol use: Yes  . Drug use: No  . Sexual activity: Not on file  Other Topics Concern  . Not on file  Social History Narrative   Lives locally by himself.  Children live locally but work and he says he is mostly alone.  Cont to smoke up to 1/2 ppd.  Does not routinely exercise.   Social Determinants of Health   Financial Resource  Strain:   . Difficulty of Paying Living Expenses:   Food Insecurity:   . Worried About Charity fundraiser in the Last Year:   . Arboriculturist in the Last Year:   Transportation Needs:   . Film/video editor (Medical):   Marland Kitchen Lack of Transportation (Non-Medical):   Physical Activity:   . Days of Exercise per Week:   . Minutes of Exercise per Session:   Stress:   . Feeling of Stress :   Social Connections:   . Frequency of Communication with Friends and Family:   . Frequency of Social Gatherings with Friends and Family:   . Attends Religious Services:   . Active Member of Clubs or Organizations:   . Attends Archivist Meetings:   Marland Kitchen Marital Status:   Intimate Partner Violence:   . Fear of Current or Ex-Partner:   . Emotionally Abused:   Marland Kitchen Physically Abused:   . Sexually Abused:     Family History  Problem Relation Age of Onset  . Diabetes Mellitus II Mother   . Diabetes Mellitus II Father   . Diabetes Mellitus II Sister   . Diabetes Mellitus II Brother       Review of systems complete and found to be negative unless listed above      PHYSICAL EXAM  General: Well developed, well nourished, in no acute distress HEENT:  Normocephalic and atramatic Neck:  No JVD.  Lungs: Clear bilaterally to auscultation and percussion. Heart: HRRR . Normal S1 and S2 without gallops or murmurs.  Abdomen: Bowel sounds are positive, abdomen soft and non-tender  Msk:  Back normal, normal gait. Normal strength and tone for age. Extremities: No clubbing, cyanosis or edema.   Neuro: Alert and oriented X 3. Psych:  Good affect, responds appropriately  Labs:   Lab Results  Component Value Date   WBC 6.2 03/22/2020   HGB 10.5 (L) 03/22/2020   HCT 34.5 (L) 03/22/2020   MCV 76.3 (L) 03/22/2020   PLT 211 03/22/2020    Recent Labs  Lab 03/22/20 0442  NA 139  K 4.1  CL 104  CO2 28  BUN 20  CREATININE 1.02  CALCIUM 8.5*  GLUCOSE 102*   Lab Results  Component Value  Date   CKTOTAL 173 10/15/2013   CKMB 1.9 10/16/2013   TROPONINI <0.03 03/15/2018    Lab Results  Component Value Date   CHOL 96 10/16/2013   CHOL 107 04/26/2013   Lab Results  Component Value Date   HDL 30 (L) 10/16/2013   HDL 37 (L) 04/26/2013   Lab Results  Component Value Date   LDLCALC 50 10/16/2013   LDLCALC 50 04/26/2013   Lab Results  Component Value Date   TRIG 81 10/16/2013   TRIG 100 04/26/2013   No results found for: CHOLHDL No results found for: LDLDIRECT    Radiology: DG Chest 2 View  Result Date: 03/22/2020 CLINICAL DATA:  79 year old male with history of chest pain and shortness of breath. EXAM: CHEST - 2 VIEW COMPARISON:  Chest x-ray 01/06/2020. FINDINGS: Eventration of the left hemidiaphragm posteriorly. Lung volumes are normal. No consolidative airspace disease. No pleural effusions. No pneumothorax. No pulmonary nodule or mass noted. Pulmonary vasculature and the cardiomediastinal silhouette are within normal limits. Atherosclerosis in the thoracic aorta. IMPRESSION: 1. No radiographic evidence of acute cardiopulmonary disease. 2. Aortic atherosclerosis. 3. Eventration of the left hemidiaphragm. Electronically Signed   By: Vinnie Langton M.D.   On: 03/22/2020 05:43    EKG: Normal sinus rhythm nonspecific ST-T wave changes heart rate of 55  ASSESSMENT AND PLAN:  Unstable angina with persistent chest pain Probable coronary disease Diabetes type 2 COPD Hypertension Transient ischemic attack Demand ischemia GERD Smoking . Plan Agree with admit rule out for microinfarction follow-up EKGs and troponins Agree with echocardiogram for assessment of left ventricular function wall motion Continue diabetes management and control Hold Metformin prior to cardiac cath Inhalers will be helpful for COPD type symptoms Including hypertension management and control Advised to refrain from tobacco abuse Continue omeprazole therapy for reflux symptoms Consider  cardiac cath prior to discharge   Signed: Yolonda Kida MD 03/22/2020, 1:06 PM

## 2020-03-22 NOTE — ED Provider Notes (Addendum)
[facility] Emergency Department Provider Note    First MD Initiated Contact with Patient 03/22/20 [phone]     (approximate)  I have reviewed the triage vital signs and the nursing notes.   HISTORY  Chief Complaint Chest Pain    HPI [name] is a 79 y.o. male presents to the ER for evaluation of midsternal nonradiating chest pain associate with some nausea.  Describes the pain as dull pressure in the middle of his chest.  Awoke him from sleep.  Does have some associated shortness of breath.  No pain when taking deep inspiration.  Not currently on any anticoagulation.  Does have a history of high blood pressure.    Past Medical History:  Diagnosis Date  . Atypical chest pain    a. 05/2011 Neg MV; b. 04/2013 Neg MV; c. 04/2019 Echo: EF 55-60%, mild BAE. Degen MV dzs. Nl RV fxn; d. 06/2019 MV: EF 55%, small, mild, fixed apical and apical ant defect - probable artifact. No ischemia.  Marland Kitchen COPD (chronic obstructive pulmonary disease) ([place])   . Coronary artery calcification seen on CT scan    a. 07/2018 CT Chest - cor Ca2+.  . Diabetes mellitus without complication ([place])   . Erosive gastropathy    a. 06/2019 EGD  . GERD (gastroesophageal reflux disease)   . Hypertension   . Pulmonary nodules    a. 07/2018 Chest CT: RML and RLL nodules.  Marland Kitchen TIA (transient ischemic attack)    No family history on file. History reviewed. No pertinent surgical history. Patient Active Problem List   Diagnosis Date Noted  . Adjustment reaction with anxiety and depression 09/02/2019  . Chest pain with high risk of acute coronary syndrome 08/29/2019  . Near syncope 05/28/2016  . COPD exacerbation ([place]) 05/27/2016      Prior to Admission medications   Medication Sig Start Date End Date Taking? Authorizing Provider  albuterol (PROAIR HFA) 108 (90 Base) MCG/ACT inhaler Inhale 2 puffs every 6 (six) hours as needed into the lungs. 12/07/15   [provider]  amLODipine  (NORVASC) 10 MG tablet Take 1 tablet (10 mg total) by mouth daily. 05/30/16   Hower, Aaron Mose, MD  aspirin EC 81 MG tablet Take 81 mg daily by mouth. 11/16/06   [provider]  budesonide-formoterol (SYMBICORT) 160-4.5 MCG/ACT inhaler Inhale 2 puffs into the lungs 2 (two) times daily.    [provider]  cyclobenzaprine (FLEXERIL) 5 MG tablet Take 1 tablet (5 mg total) by mouth 3 (three) times daily. Patient not taking: Reported on 08/29/2019 05/30/16   Hower, Aaron Mose, MD  feeding supplement, ENSURE ENLIVE, (ENSURE ENLIVE) LIQD Take 237 mLs by mouth 2 (two) times daily between meals. Patient not taking: Reported on 07/27/2017 05/30/16   Hower, Aaron Mose, MD  gabapentin (NEURONTIN) 100 MG capsule Take 100 mg by mouth at bedtime. 08/06/19   [provider]  lisinopril (PRINIVIL,ZESTRIL) 20 MG tablet Take 20 mg by mouth daily.    [provider]  lovastatin (MEVACOR) 40 MG tablet Take 40 mg by mouth at bedtime.    [provider]  MELATONIN PO Take 5 mg by mouth at bedtime as needed.    [provider]  metFORMIN (GLUCOPHAGE) 850 MG tablet Take 850 mg by mouth 2 (two) times daily with a meal.     [provider]  metoprolol succinate (TOPROL-XL) 25 MG 24 hr tablet Take 12.5 mg by mouth daily. 08/02/19   [provider]  omeprazole (PRILOSEC) 20 MG capsule Take 40 mg by mouth 2 (two) times daily before a meal.     [provider]  ondansetron (ZOFRAN ODT) 4 MG disintegrating tablet Take 1 tablet (4 mg total) by mouth every 8 (eight) hours as needed for nausea or vomiting. 01/07/20   Carrie Mew, MD  tiotropium (SPIRIVA) 18 MCG inhalation capsule Place 1 capsule into inhaler and inhale daily. 12/07/15   [provider]  traZODone (DESYREL) 50 MG tablet Take 1 tablet (50 mg total) by mouth at bedtime. 09/02/19   Gregor Hams, MD    Allergies Codeine, Ivp dye [iodinated diagnostic agents], and Percocet  [oxycodone-acetaminophen]    Social History Social History   Tobacco Use  . Smoking status: Current Every Day Smoker    Packs/day: 0.50    Years: 60.00    Pack years: 30.00  . Smokeless tobacco: Never Used  . Tobacco comment: still smoking 1/2 ppd.  Substance Use Topics  . Alcohol use: Yes  . Drug use: No    Review of Systems Patient denies headaches, rhinorrhea, blurry vision, numbness, shortness of breath, chest pain, edema, cough, abdominal pain, nausea, vomiting, diarrhea, dysuria, fevers, rashes or hallucinations unless otherwise stated above in HPI. ____________________________________________   PHYSICAL EXAM:  VITAL SIGNS: Vitals:   03/22/20 0545 03/22/20 0619  BP: (!) 157/87 (!) 166/95  Pulse: 87 93  Resp: 20 16  Temp:    SpO2:  96%    Constitutional: Alert and oriented.  Eyes: Conjunctivae are normal.  Head: Atraumatic. Nose: No congestion/rhinnorhea. Mouth/Throat: Mucous membranes are moist.   Neck: No stridor. Painless ROM.  Cardiovascular: Normal rate, regular rhythm. Grossly normal heart sounds.  Good peripheral circulation. Respiratory: Normal respiratory effort.  No retractions. Lungs CTAB. Gastrointestinal: Soft and nontender. No distention. No abdominal bruits. No CVA tenderness. Genitourinary:  Musculoskeletal: No lower extremity tenderness nor edema.  No joint effusions. Neurologic:  Normal speech and language. No gross focal neurologic deficits are appreciated. No facial droop Skin:  Skin is warm, dry and intact. No rash noted. Psychiatric: Mood and affect are normal. Speech and behavior are normal.  ____________________________________________   LABS (all labs ordered are listed, but only abnormal results are displayed)  Results for orders placed or performed during the hospital encounter of 03/22/20 (from the past 24 hour(s))  Basic metabolic panel     Status: Abnormal   Collection Time: 03/22/20  4:42 AM  Result Value Ref Range    Sodium 139 135 - 145 mmol/L   Potassium 4.1 3.5 - 5.1 mmol/L   Chloride 104 98 - 111 mmol/L   CO2 28 22 - 32 mmol/L   Glucose, Bld 102 (H) 70 - 99 mg/dL   BUN 20 8 - 23 mg/dL   Creatinine, Ser 1.02 0.61 - 1.24 mg/dL   Calcium 8.5 (L) 8.9 - 10.3 mg/dL   GFR calc non Af Amer >60 >60 mL/min   GFR calc Af Amer >60 >60 mL/min   Anion gap 7 5 - 15  CBC     Status: Abnormal   Collection Time: 03/22/20  4:42 AM  Result Value Ref Range   WBC 6.2 4.0 - 10.5 K/uL   RBC 4.52 4.22 - 5.81 MIL/uL   Hemoglobin 10.5 (L) 13.0 - 17.0 g/dL   HCT 34.5 (L) 39 - 52 %   MCV 76.3 (L) 80.0 - 100.0 fL   MCH 23.2 (L) 26.0 - 34.0 pg   MCHC 30.4 30.0 -  36.0 g/dL   RDW 19.1 (H) 11.5 - 15.5 %   Platelets 211 150 - 400 K/uL   nRBC 0.0 0.0 - 0.2 %  Troponin I (High Sensitivity)     Status: Abnormal   Collection Time: 03/22/20  4:42 AM  Result Value Ref Range   Troponin I (High Sensitivity) 116 (HH) <18 ng/L   ____________________________________________  EKG My review and personal interpretation at Time: 4:37   Indication: chest pain  Rate: 95  Rhythm: sinus Axis: normal intervals Other: inferolateral twi that are new as compared to previous ____________________________________________  RADIOLOGY  I personally reviewed all radiographic images ordered to evaluate for the above acute complaints and reviewed radiology reports and findings.  These findings were personally discussed with the patient.  Please see medical record for radiology report.  ____________________________________________   PROCEDURES  Procedure(s) performed:  .Critical Care Performed by: Merlyn Lot, MD Authorized by: Merlyn Lot, MD   Critical care provider statement:    Critical care time (minutes):  15   Critical care time was exclusive of:  Separately billable procedures and treating other patients   Critical care was necessary to treat or prevent imminent or life-threatening deterioration of the following  conditions:  Cardiac failure   Critical care was time spent personally by me on the following activities:  Development of treatment plan with patient or surrogate, discussions with consultants, evaluation of patient's response to treatment, examination of patient, obtaining history from patient or surrogate, ordering and performing treatments and interventions, ordering and review of laboratory studies, ordering and review of radiographic studies, pulse oximetry, re-evaluation of patient's condition and review of old charts      Critical Care performed: yes ____________________________________________   INITIAL IMPRESSION / [place] / ED COURSE  Pertinent labs & imaging results that were available during my care of the patient were reviewed by me and considered in my medical decision making (see chart for details).   DDX: ACS, pericarditis,, pe, dissection, pna, bronchitis, costochondritis   [name] is a 79 y.o. who presents to the ED with symptoms as described above.  Patient is nontoxic-appearing but history is concerning for ACS.   Abdominal exam is soft and benign.  + h/o COPD but no hopoxia and no significant wheeze on exam. His EKG does show some T wave inversions that are more prominent than on previous EKGs and his initial troponin is 119.  He is hypertensive therefore will give nitro as well as aspirin.  Pain improved with nitro.  Will give heparin.  Have a lower suspicion for PE or dissection.  He does have a contrast allergy as well.  There is no pulse differential and no mediastinal widening.  Denies any pain shooting through to his back. Clinically this is concerning for ACS vs hypertensive urgency among the aboe differential.  Given elevated trop and ekg changes will heparinize and discuss with hospitalist for admission.     The patient was evaluated in Emergency Department today for the symptoms described in the history of present illness. He/she was evaluated in  the context of the global COVID-19 pandemic, which necessitated consideration that the patient might be at risk for infection with the SARS-CoV-2 virus that causes COVID-19. Institutional protocols and algorithms that pertain to the evaluation of patients at risk for COVID-19 are in a state of rapid change based on information released by regulatory bodies including the CDC and federal and state organizations. These policies and algorithms were followed during the patient's  care in the ED.  As part of my medical decision making, I reviewed the following data within the [place] notes reviewed and incorporated, Labs reviewed, notes from prior ED visits and [place] Controlled Substance Database   ____________________________________________   FINAL CLINICAL IMPRESSION(S) / ED DIAGNOSES  Final diagnoses:  Unstable angina ([place])      NEW MEDICATIONS STARTED DURING THIS VISIT:  New Prescriptions   No medications on file     Note:  This document was prepared using Dragon voice recognition software and may include unintentional dictation errors.    Merlyn Lot, MD 03/22/20 [id number]    Merlyn Lot, MD 03/22/20 [phone]

## 2020-03-22 NOTE — ED Notes (Signed)
Pt had call light in while sitting in subwait, pt reports feeling like he is going to pass out, VSS at this time, pt reports he can't stop shaking either.  Pt placed in recliner for comfort.  Will continue to monitor.

## 2020-03-22 NOTE — ED Triage Notes (Signed)
Patient with complaint of left side chest pain, shortness of breath and nausea times one hour.

## 2020-03-22 NOTE — H&P (Signed)
History and Physical    [name] WSF:[id number] DOB: [birth year] DOA: 03/22/2020  Referring MD/NP/PA:   PCP: Joanie Coddington, MD   Patient coming from:  The patient is coming from home.  At baseline, pt is independent for most of ADL.        Chief Complaint: chest pain  HPI: [name] is a 79 y.o. male with medical history significant of hypertension, diabetes mellitus, COPD, TIA, GERD, CAD, erosive gastropathy, pulmonologist, tobacco abuse, who presents with chest pain.  Patient states that his chest pain started in the early morning when he woke up from sleeping.  It is located in the left side of chest, constant, sharp and dull, 8 out of 10 in severity initially, currently 4 out of 10 severity, nonradiating.  It is not pleuritic, not aggravated by deep breaths.  It is associated with shortness of breath, no fever or chills.  Patient states that he has mild cough due to COPD, which has not changed.  Patient has nausea, but no vomiting, diarrhea, abdominal pain, symptoms of UTI or unilateral weakness.   ED Course: pt was found to have trop 116-->103, WBC 6.2, pending COVID-19 PCR, electrolytes renal function okay, temperature normal, blood pressure 166/95, heart rate 93, RR 20, oxygen saturation 96% on room air.  Chest x-ray negative.  Patient is placed on progressive bed for observation.  Cardiology, Dr. Clayborn Bigness is consulted.  Review of Systems:   General: no fevers, chills, no body weight gain, has fatigue HEENT: no blurry vision, hearing changes or sore throat Respiratory: has dyspnea, coughing, no wheezing CV: no chest pain, no palpitations GI: has nausea, no vomiting, abdominal pain, diarrhea, constipation GU: no dysuria, burning on urination, increased urinary frequency, hematuria  Ext: no leg edema Neuro: no unilateral weakness, numbness, or tingling, no vision change or hearing loss Skin: no rash, no skin tear. MSK: No muscle spasm, no deformity, no limitation of range of  movement in spin Heme: No easy bruising.  Travel history: No recent long distant travel.  Allergy:  Allergies  Allergen Reactions  . Lisinopril Swelling    Lip and check swelling  . Aspirin     Held due to anemia and fall risk.  . Codeine   . Ivp Dye [Iodinated Diagnostic Agents] Hives and Other (See Comments)    dizzy  . Percocet [Oxycodone-Acetaminophen] Hives and Nausea Only    Past Medical History:  Diagnosis Date  . Atypical chest pain    a. 05/2011 Neg MV; b. 04/2013 Neg MV; c. 04/2019 Echo: EF 55-60%, mild BAE. Degen MV dzs. Nl RV fxn; d. 06/2019 MV: EF 55%, small, mild, fixed apical and apical ant defect - probable artifact. No ischemia.  Marland Kitchen COPD (chronic obstructive pulmonary disease) ([place])   . Coronary artery calcification seen on CT scan    a. 07/2018 CT Chest - cor Ca2+.  . Diabetes mellitus without complication ([place])   . Erosive gastropathy    a. 06/2019 EGD  . GERD (gastroesophageal reflux disease)   . Hypertension   . Pulmonary nodules    a. 07/2018 Chest CT: RML and RLL nodules.  Marland Kitchen TIA (transient ischemic attack)     History reviewed. No pertinent surgical history.  Social History:  reports that he has been smoking. He has a 30.00 pack-year smoking history. He has never used smokeless tobacco. He reports current alcohol use. He reports that he does not use drugs.  Family History:  Family History  Problem Relation Age of Onset  .  Diabetes Mellitus II Mother   . Diabetes Mellitus II Father   . Diabetes Mellitus II Sister   . Diabetes Mellitus II Brother      Prior to Admission medications   Medication Sig Start Date End Date Taking? Authorizing Provider  albuterol (PROAIR HFA) 108 (90 Base) MCG/ACT inhaler Inhale 2 puffs every 6 (six) hours as needed into the lungs. 12/07/15   [provider]  amLODipine (NORVASC) 10 MG tablet Take 1 tablet (10 mg total) by mouth daily. 05/30/16   Hower, Aaron Mose, MD  aspirin EC 81 MG tablet Take 81 mg daily by  mouth. 11/16/06   [provider]  budesonide-formoterol (SYMBICORT) 160-4.5 MCG/ACT inhaler Inhale 2 puffs into the lungs 2 (two) times daily.    [provider]  cyclobenzaprine (FLEXERIL) 5 MG tablet Take 1 tablet (5 mg total) by mouth 3 (three) times daily. Patient not taking: Reported on 08/29/2019 05/30/16   Hower, Aaron Mose, MD  feeding supplement, ENSURE ENLIVE, (ENSURE ENLIVE) LIQD Take 237 mLs by mouth 2 (two) times daily between meals. Patient not taking: Reported on 07/27/2017 05/30/16   Hower, Aaron Mose, MD  gabapentin (NEURONTIN) 100 MG capsule Take 100 mg by mouth at bedtime. 08/06/19   [provider]  lisinopril (PRINIVIL,ZESTRIL) 20 MG tablet Take 20 mg by mouth daily.    [provider]  lovastatin (MEVACOR) 40 MG tablet Take 40 mg by mouth at bedtime.    [provider]  MELATONIN PO Take 5 mg by mouth at bedtime as needed.    [provider]  metFORMIN (GLUCOPHAGE) 850 MG tablet Take 850 mg by mouth 2 (two) times daily with a meal.     [provider]  metoprolol succinate (TOPROL-XL) 25 MG 24 hr tablet Take 12.5 mg by mouth daily. 08/02/19   [provider]  omeprazole (PRILOSEC) 20 MG capsule Take 40 mg by mouth 2 (two) times daily before a meal.     [provider]  ondansetron (ZOFRAN ODT) 4 MG disintegrating tablet Take 1 tablet (4 mg total) by mouth every 8 (eight) hours as needed for nausea or vomiting. 01/07/20   Carrie Mew, MD  tiotropium (SPIRIVA) 18 MCG inhalation capsule Place 1 capsule into inhaler and inhale daily. 12/07/15   [provider]  traZODone (DESYREL) 50 MG tablet Take 1 tablet (50 mg total) by mouth at bedtime. 09/02/19   Gregor Hams, MD    Physical Exam: Vitals:   03/22/20 0800 03/22/20 0906 03/22/20 1122 03/22/20 1540  BP: (!) 164/81 (!) 157/85 (!) 163/92 (!) 161/93  Pulse: 91 84 83 74  Resp: 16 16 16 17   Temp:  98 F (36.7 C) 97.8 F (36.6 C) 97.9 F  (36.6 C)  TempSrc:  Oral    SpO2: 96% 97% 96% 98%  Weight:      Height:       General: Not in acute distress HEENT:       Eyes: PERRL, EOMI, no scleral icterus.       ENT: No discharge from the ears and nose, no pharynx injection, no tonsillar enlargement.        Neck: No JVD, no bruit, no mass felt. Heme: No neck lymph node enlargement. Cardiac: S1/S2, RRR, No murmurs, No gallops or rubs. Respiratory: No rales, wheezing, rhonchi or rubs. GI: Soft, nondistended, nontender, no rebound pain, no organomegaly, BS present. GU: No hematuria Ext: No pitting leg edema bilaterally. 2+DP/PT pulse bilaterally. Musculoskeletal: No joint  deformities, No joint redness or warmth, no limitation of ROM in spin. Skin: No rashes.  Neuro: Alert, oriented X3, cranial nerves II-XII grossly intact, moves all extremities normally. Psych: Patient is not psychotic, no suicidal or hemocidal ideation.  Labs on Admission: I have personally reviewed following labs and imaging studies  CBC: Recent Labs  Lab 03/22/20 0442  WBC 6.2  HGB 10.5*  HCT 34.5*  MCV 76.3*  PLT 992   Basic Metabolic Panel: Recent Labs  Lab 03/22/20 0442  NA 139  K 4.1  CL 104  CO2 28  GLUCOSE 102*  BUN 20  CREATININE 1.02  CALCIUM 8.5*   GFR: Estimated Creatinine Clearance: 55.2 mL/min (by C-G formula based on SCr of 1.02 mg/dL). Liver Function Tests: No results for input(s): AST, ALT, ALKPHOS, BILITOT, PROT, ALBUMIN in the last 168 hours. No results for input(s): LIPASE, AMYLASE in the last 168 hours. No results for input(s): AMMONIA in the last 168 hours. Coagulation Profile: Recent Labs  Lab 03/22/20 0708  INR 1.1   Cardiac Enzymes: No results for input(s): CKTOTAL, CKMB, CKMBINDEX, TROPONINI in the last 168 hours. BNP (last 3 results) No results for input(s): PROBNP in the last 8760 hours. HbA1C: Recent Labs    03/22/20 0445  HGBA1C 6.2*   CBG: Recent Labs  Lab 03/22/20 1124 03/22/20 1541  GLUCAP  117* 84   Lipid Profile: No results for input(s): CHOL, HDL, LDLCALC, TRIG, CHOLHDL, LDLDIRECT in the last 72 hours. Thyroid Function Tests: No results for input(s): TSH, T4TOTAL, FREET4, T3FREE, THYROIDAB in the last 72 hours. Anemia Panel: No results for input(s): VITAMINB12, FOLATE, FERRITIN, TIBC, IRON, RETICCTPCT in the last 72 hours. Urine analysis:    Component Value Date/Time   COLORURINE YELLOW (A) 01/06/2020 2251   APPEARANCEUR CLEAR (A) 01/06/2020 2251   APPEARANCEUR Clear 10/16/2013 0435   LABSPEC 1.020 01/06/2020 2251   LABSPEC 1.015 10/16/2013 0435   PHURINE 6.0 01/06/2020 2251   GLUCOSEU NEGATIVE 01/06/2020 2251   GLUCOSEU Negative 10/16/2013 0435   HGBUR NEGATIVE 01/06/2020 2251   BILIRUBINUR NEGATIVE 01/06/2020 2251   BILIRUBINUR Negative 10/16/2013 [place] 01/06/2020 2251   PROTEINUR NEGATIVE 01/06/2020 2251   NITRITE NEGATIVE 01/06/2020 2251   LEUKOCYTESUR NEGATIVE 01/06/2020 2251   LEUKOCYTESUR Negative 10/16/2013 0435   Sepsis Labs: @LABRCNTIP (procalcitonin:4,lacticidven:4) ) Recent Results (from the past 240 hour(s))  SARS Coronavirus 2 by RT PCR (hospital order, performed in [facility] hospital lab) Nasopharyngeal Nasopharyngeal Swab     Status: None   Collection Time: 03/22/20  7:08 AM   Specimen: Nasopharyngeal Swab  Result Value Ref Range Status   SARS Coronavirus 2 NEGATIVE NEGATIVE Final    Comment: (NOTE) SARS-CoV-2 target nucleic acids are NOT DETECTED.  The SARS-CoV-2 RNA is generally detectable in upper and lower respiratory specimens during the acute phase of infection. The lowest concentration of SARS-CoV-2 viral copies this assay can detect is 250 copies / mL. A negative result does not preclude SARS-CoV-2 infection and should not be used as the sole basis for treatment or other patient management decisions.  A negative result may occur with improper specimen collection / handling, submission of specimen other than  nasopharyngeal swab, presence of viral mutation(s) within the areas targeted by this assay, and inadequate number of viral copies (<250 copies / mL). A negative result must be combined with clinical observations, patient history, and epidemiological information.  Fact Sheet for Patients:   StrictlyIdeas.no  Fact Sheet for Healthcare Providers: BankingDealers.co.za  This test is not yet approved or  cleared by the [place] and has been authorized for detection and/or diagnosis of SARS-CoV-2 by FDA under an Emergency Use Authorization (EUA).  This EUA will remain in effect (meaning this test can be used) for the duration of the COVID-19 declaration under Section 564(b)(1) of the Act, 21 U.S.C. section 360bbb-3(b)(1), unless the authorization is terminated or revoked sooner.  Performed at [facility], [address] [place], [place] [address]      Radiological Exams on Admission: DG Chest 2 View  Result Date: 03/22/2020 CLINICAL DATA:  79 year old male with history of chest pain and shortness of breath. EXAM: CHEST - 2 VIEW COMPARISON:  Chest x-ray 01/06/2020. FINDINGS: Eventration of the left hemidiaphragm posteriorly. Lung volumes are normal. No consolidative airspace disease. No pleural effusions. No pneumothorax. No pulmonary nodule or mass noted. Pulmonary vasculature and the cardiomediastinal silhouette are within normal limits. Atherosclerosis in the thoracic aorta. IMPRESSION: 1. No radiographic evidence of acute cardiopulmonary disease. 2. Aortic atherosclerosis. 3. Eventration of the left hemidiaphragm. Electronically Signed   By: Vinnie Langton M.D.   On: 03/22/2020 05:43   ECHOCARDIOGRAM COMPLETE  Result Date: 03/22/2020    ECHOCARDIOGRAM REPORT   Patient Name:   [name] Date of Exam: 03/22/2020 Medical Rec #:  [id number]    Height:       68.0 in Accession #:    [id number]   Weight:       142.0 lb Date of  Birth:  August 25, 1941    BSA:          1.767 m Patient Age:    88 years     BP:           163/92 mmHg Patient Gender: M            HR:           82 bpm. Exam Location:  [facility] Procedure: 2D Echo Indications:     Chest Pain R07.9  History:         Patient has prior history of Echocardiogram examinations, most                  recent 05/28/2016.  Sonographer:     Arville Go RDCS Referring Phys:  Unknown Foley Yamaira Spinner Diagnosing Phys: Yolonda Kida MD IMPRESSIONS  1. Left ventricular ejection fraction, by estimation, is 50 to 55%. The left ventricle has low normal function. The left ventricle has no regional wall motion abnormalities. Left ventricular diastolic parameters are consistent with Grade III diastolic dysfunction (restrictive).  2. Right ventricular systolic function is normal. The right ventricular size is normal.  3. The mitral valve is normal in structure. No evidence of mitral valve regurgitation.  4. The aortic valve is normal in structure. Aortic valve regurgitation is not visualized. FINDINGS  Left Ventricle: Left ventricular ejection fraction, by estimation, is 50 to 55%. The left ventricle has low normal function. The left ventricle has no regional wall motion abnormalities. The left ventricular internal cavity size was normal in size. There is no left ventricular hypertrophy. Left ventricular diastolic parameters are consistent with Grade III diastolic dysfunction (restrictive). Right Ventricle: The right ventricular size is normal. No increase in right ventricular wall thickness. Right ventricular systolic function is normal. Left Atrium: Left atrial size was normal in size. Right Atrium: Right atrial size was normal in size. Pericardium: There is no evidence of pericardial effusion. Mitral Valve: The mitral valve is normal in structure.  No evidence of mitral valve regurgitation. Tricuspid Valve: The tricuspid valve is normal in structure. Tricuspid valve regurgitation is not demonstrated. Aortic Valve:  The aortic valve is normal in structure. Aortic valve regurgitation is not visualized. Aortic valve peak gradient measures 3.6 mmHg. Pulmonic Valve: The pulmonic valve was grossly normal. Pulmonic valve regurgitation is not visualized. Aorta: The aortic root is normal in size and structure. IAS/Shunts: No atrial level shunt detected by color flow Doppler.  LEFT VENTRICLE PLAX 2D LVIDd:         5.08 cm  Diastology LVIDs:         3.50 cm  LV e' lateral:   6.31 cm/s LV PW:         1.37 cm  LV E/e' lateral: 9.2 LV IVS:        1.28 cm LVOT diam:     2.10 cm LV SV:         54 LV SV Index:   31 LVOT Area:     3.46 cm  RIGHT VENTRICLE RV S prime:     13.40 cm/s TAPSE (M-mode): 2.2 cm LEFT ATRIUM           Index       RIGHT ATRIUM           Index LA diam:      3.10 cm 1.75 cm/m  RA Area:     14.90 cm LA Vol (A2C): 13.4 ml 7.58 ml/m  RA Volume:   38.00 ml  21.51 ml/m LA Vol (A4C): 24.4 ml 13.81 ml/m  AORTIC VALVE                PULMONIC VALVE AV Area (Vmax): 2.74 cm    PV Vmax:       0.91 m/s AV Vmax:        95.00 cm/s  PV Peak grad:  3.3 mmHg AV Peak Grad:   3.6 mmHg LVOT Vmax:      75.10 cm/s LVOT Vmean:     44.800 cm/s LVOT VTI:       0.157 m  AORTA Ao Root diam: 3.60 cm Ao Asc diam:  3.60 cm MITRAL VALVE MV Area (PHT): 4.68 cm     SHUNTS MV Decel Time: 162 msec     Systemic VTI:  0.16 m MV E velocity: 58.30 cm/s   Systemic Diam: 2.10 cm MV A velocity: 102.00 cm/s MV E/A ratio:  0.57 Dwayne Prince Rome MD Electronically signed by Yolonda Kida MD Signature Date/Time: 03/22/2020/3:39:36 PM    Final      EKG: Independently reviewed.  Sinus rhythm, QTC 480, occasional PAC, T wave inversion in lead III/aVF and V5-V6    Assessment/Plan Principal Problem:   Chest pain Active Problems:   COPD (chronic obstructive pulmonary disease) (HCC)   Diabetes mellitus without complication (HCC)   GERD (gastroesophageal reflux disease)   Hypertension   TIA (transient ischemic attack)   Elevated troponin   CAD  (coronary artery disease)   HLD (hyperlipidemia)   Chest pain and elevated troponin:  Trop 116 -->103 -->102 -->112. Dr. Clayborn Bigness is consultred.  - place to progressive unit for observation - IV heparin started in Ed - Trend Trop - Repeat EKG in the am  - prn Nitroglycerin, Morphine, and aspirin, Lipitor - Risk factor stratification: will check FLP and A1C  - check UDS - 2d echo  COPD (chronic obstructive pulmonary disease) ([place]): Stable -Bronchodilators   Diabetes mellitus without complication ([place]): Recent A1c 7.0.  Patient is taking Metformin at home for -Sliding scale insulin  GERD (gastroesophageal reflux disease) -Protonix  Hypertension -IV hydralazine as needed -Amlodipine, metoprolol  TIA (transient ischemic attack) -Aspirin, Lipitor  HLD: -Lipitor       DVT ppx: on IV Heparin   Code Status: Full code Family Communication:  Yes, patient's son by phone Disposition Plan:  Anticipate discharge back to previous environment Consults called:  Dr. Clayborn Bigness Admission status:   progressive unit for obs    Status is: Observation  The patient remains OBS appropriate and will d/c before 2 midnights.  Dispo: The patient is from: Home              Anticipated d/c is to: Home              Anticipated d/c date is: 1 day              Patient currently is not medically stable to d/c.           Date of Service 03/22/2020    Ivor Costa [facility]   If 7PM-7AM, please contact night-coverage [email or web address] 03/22/2020, 5:40 PM

## 2020-03-23 DIAGNOSIS — Z7951 Long term (current) use of inhaled steroids: Secondary | ICD-10-CM | POA: Diagnosis not present

## 2020-03-23 DIAGNOSIS — K219 Gastro-esophageal reflux disease without esophagitis: Secondary | ICD-10-CM | POA: Diagnosis present

## 2020-03-23 DIAGNOSIS — Z888 Allergy status to other drugs, medicaments and biological substances status: Secondary | ICD-10-CM | POA: Diagnosis not present

## 2020-03-23 DIAGNOSIS — E119 Type 2 diabetes mellitus without complications: Secondary | ICD-10-CM | POA: Diagnosis not present

## 2020-03-23 DIAGNOSIS — Z7984 Long term (current) use of oral hypoglycemic drugs: Secondary | ICD-10-CM | POA: Diagnosis not present

## 2020-03-23 DIAGNOSIS — J449 Chronic obstructive pulmonary disease, unspecified: Secondary | ICD-10-CM | POA: Diagnosis not present

## 2020-03-23 DIAGNOSIS — Z8673 Personal history of transient ischemic attack (TIA), and cerebral infarction without residual deficits: Secondary | ICD-10-CM | POA: Diagnosis not present

## 2020-03-23 DIAGNOSIS — I25110 Atherosclerotic heart disease of native coronary artery with unstable angina pectoris: Secondary | ICD-10-CM | POA: Diagnosis present

## 2020-03-23 DIAGNOSIS — I209 Angina pectoris, unspecified: Secondary | ICD-10-CM | POA: Diagnosis not present

## 2020-03-23 DIAGNOSIS — I214 Non-ST elevation (NSTEMI) myocardial infarction: Secondary | ICD-10-CM | POA: Diagnosis not present

## 2020-03-23 DIAGNOSIS — Z79899 Other long term (current) drug therapy: Secondary | ICD-10-CM | POA: Diagnosis not present

## 2020-03-23 DIAGNOSIS — Z833 Family history of diabetes mellitus: Secondary | ICD-10-CM | POA: Diagnosis not present

## 2020-03-23 DIAGNOSIS — Z886 Allergy status to analgesic agent status: Secondary | ICD-10-CM | POA: Diagnosis not present

## 2020-03-23 DIAGNOSIS — F17210 Nicotine dependence, cigarettes, uncomplicated: Secondary | ICD-10-CM | POA: Diagnosis present

## 2020-03-23 DIAGNOSIS — Z91041 Radiographic dye allergy status: Secondary | ICD-10-CM | POA: Diagnosis not present

## 2020-03-23 DIAGNOSIS — Z885 Allergy status to narcotic agent status: Secondary | ICD-10-CM | POA: Diagnosis not present

## 2020-03-23 DIAGNOSIS — E785 Hyperlipidemia, unspecified: Secondary | ICD-10-CM | POA: Diagnosis present

## 2020-03-23 DIAGNOSIS — K319 Disease of stomach and duodenum, unspecified: Secondary | ICD-10-CM | POA: Diagnosis present

## 2020-03-23 DIAGNOSIS — I10 Essential (primary) hypertension: Secondary | ICD-10-CM | POA: Diagnosis not present

## 2020-03-23 DIAGNOSIS — Z20822 Contact with and (suspected) exposure to covid-19: Secondary | ICD-10-CM | POA: Diagnosis present

## 2020-03-23 DIAGNOSIS — R079 Chest pain, unspecified: Secondary | ICD-10-CM | POA: Diagnosis not present

## 2020-03-23 LAB — GLUCOSE, CAPILLARY
Glucose-Capillary: 108 mg/dL — ABNORMAL HIGH (ref 70–99)
Glucose-Capillary: 116 mg/dL — ABNORMAL HIGH (ref 70–99)
Glucose-Capillary: 149 mg/dL — ABNORMAL HIGH (ref 70–99)
Glucose-Capillary: 139 mg/dL — ABNORMAL HIGH (ref 70–99)

## 2020-03-23 LAB — BASIC METABOLIC PANEL
Anion gap: 7 (ref 5–15)
BUN: 22 mg/dL (ref 8–23)
CO2: 28 mmol/L (ref 22–32)
Calcium: 8.6 mg/dL — ABNORMAL LOW (ref 8.9–10.3)
Chloride: 100 mmol/L (ref 98–111)
Creatinine, Ser: 0.86 mg/dL (ref 0.61–1.24)
GFR calc Af Amer: >60 mL/min (ref >60)
GFR calc non Af Amer: >60 mL/min (ref >60)
Glucose, Bld: 104 mg/dL — ABNORMAL HIGH (ref 70–99)
Potassium: 4.1 mmol/L (ref 3.5–5.1)
Sodium: 135 mmol/L (ref 135–145)

## 2020-03-23 LAB — FIBRIN DERIVATIVES D-DIMER (ARMC ONLY): Fibrin derivatives D-dimer (ARMC): 1232.5 ng/mL (FEU) — ABNORMAL HIGH (ref 0.00–499.00)

## 2020-03-23 LAB — CBC
HCT: 34.3 % — ABNORMAL LOW (ref 39.0–52.0)
Hemoglobin: 10.7 g/dL — ABNORMAL LOW (ref 13.0–17.0)
MCH: 23.5 pg — ABNORMAL LOW (ref 26.0–34.0)
MCHC: 31.2 g/dL (ref 30.0–36.0)
MCV: 75.2 fL — ABNORMAL LOW (ref 80.0–100.0)
Platelets: 196 10*3/uL (ref 150–400)
RBC: 4.56 MIL/uL (ref 4.22–5.81)
RDW: 19.1 % — ABNORMAL HIGH (ref 11.5–15.5)
WBC: 7.6 10*3/uL (ref 4.0–10.5)
nRBC: 0 % (ref 0.0–0.2)

## 2020-03-23 LAB — HEPARIN LEVEL (UNFRACTIONATED): Heparin Unfractionated: 0.38 IU/mL (ref 0.30–0.70)

## 2020-03-23 LAB — LIPID PANEL
Cholesterol: 88 mg/dL (ref 0–200)
HDL: 48 mg/dL (ref >40)
LDL Cholesterol: 30 mg/dL (ref 0–99)
Total CHOL/HDL Ratio: 1.8 RATIO
Triglycerides: 50 mg/dL (ref <150)
VLDL: 10 mg/dL (ref 0–40)

## 2020-03-23 NOTE — Progress Notes (Signed)
ANTICOAGULATION CONSULT NOTE - Initial Consult  Pharmacy Consult for heparin Indication: chest pain/ACS  Allergies  Allergen Reactions  . Lisinopril Swelling    Lip and check swelling  . Aspirin     Held due to anemia and fall risk.  . Codeine   . Ivp Dye [Iodinated Diagnostic Agents] Hives and Other (See Comments)    dizzy  . Percocet [Oxycodone-Acetaminophen] Hives and Nausea Only    Patient Measurements: Height: 5\' 8"  (172.7 cm) Weight: 64.4 kg (142 lb) IBW/kg (Calculated) : 68.4 Heparin Dosing Weight: 64.4 kg  Vital Signs: Temp: 97.8 F (36.6 C) (07/05 0758) Temp Source: Oral (07/05 0533) BP: 134/81 (07/05 0758) Pulse Rate: 73 (07/05 0758)  Labs: Recent Labs    03/22/20 0442 03/22/20 3762 03/22/20 0708 03/22/20 0804 03/22/20 1054 03/22/20 1442 03/22/20 2055 03/23/20 0758  HGB 10.5*  --   --   --   --   --   --  10.7*  HCT 34.5*  --   --   --   --   --   --  34.3*  PLT 211  --   --   --   --   --   --  196  APTT  --   --  27  --   --   --   --   --   LABPROT  --   --  13.5  --   --   --   --   --   INR  --   --  1.1  --   --   --   --   --   HEPARINUNFRC  --   --   --   --   --  0.33 0.35 0.38  CREATININE 1.02  --   --   --   --   --   --  0.86  TROPONINIHS 116*   < >  --  102* 110* 112*  --   --    < > = values in this interval not displayed.    Estimated Creatinine Clearance: 65.5 mL/min (by C-G formula based on SCr of 0.86 mg/dL).   Medical History: Past Medical History:  Diagnosis Date  . Atypical chest pain    a. 05/2011 Neg MV; b. 04/2013 Neg MV; c. 04/2019 Echo: EF 55-60%, mild BAE. Degen MV dzs. Nl RV fxn; d. 06/2019 MV: EF 55%, small, mild, fixed apical and apical ant defect - probable artifact. No ischemia.  Marland Kitchen COPD (chronic obstructive pulmonary disease) ([place])   . Coronary artery calcification seen on CT scan    a. 07/2018 CT Chest - cor Ca2+.  . Diabetes mellitus without complication ([place])   . Erosive gastropathy    a. 06/2019 EGD  . GERD  (gastroesophageal reflux disease)   . Hypertension   . Pulmonary nodules    a. 07/2018 Chest CT: RML and RLL nodules.  Marland Kitchen TIA (transient ischemic attack)     Medications:  Scheduled:  . amLODipine  10 mg Oral Daily  . atorvastatin  40 mg Oral Daily  . ferrous sulfate  325 mg Oral QODAY  . gabapentin  100 mg Oral BID  . insulin aspart  0-5 Units Subcutaneous QHS  . insulin aspart  0-9 Units Subcutaneous TID WC  . metoprolol succinate  12.5 mg Oral Daily  . mometasone-formoterol  2 puff Inhalation BID  . nicotine  21 mg Transdermal Daily  . pantoprazole  40 mg Oral Daily  .  traZODone  50 mg Oral QHS    Assessment: Patient w/ h/o CAD, HTN, PVC's arrives w/ troponin of 116 w/ EKG showing SR w/ PVCs. Baseline CBC WNL, aPTT/INR pending. Patient not on PTA anticoagulation. Patient is being started on heparin for management of NSTEMI.  7/4 AM: Heparin infusion started @ 900 units/hr 7/4 1442 HL 0.33, therapeutic x 1, no change 7/4 2100 HL 0.35, therapeutic x 2, no change 7/5 0758 HL 0.38, no rate change  Goal of Therapy:  Heparin level 0.3-0.7 units/ml Monitor platelets by anticoagulation protocol: Yes   Plan:  7/5 07:58 HL 0.38, remains therapeutic. Will continue with current rate of 900 units/hr. Recheck HL with AM labs. Daily CBC while on Heparin drip.  Paulina Fusi, PharmD, BCPS 03/23/2020 9:05 AM

## 2020-03-23 NOTE — Progress Notes (Signed)
PROGRESS NOTE    [name]  MVH:[id number] DOB: [birth year] DOA: 03/22/2020 PCP: Joanie Coddington, MD   Chief complaint.  Chest pain.  Brief Narrative:  From Dr. Blaine Hamper H&P:  [name] is a 79 y.o. male with medical history significant of hypertension, diabetes mellitus, COPD, TIA, GERD, CAD, erosive gastropathy, pulmonologist, tobacco abuse, who presents with chest pain.  Patient states that his chest pain started in the early morning when he woke up from sleeping.  It is located in the left side of chest, constant, sharp and dull, 8 out of 10 in severity initially, currently 4 out of 10 severity, nonradiating.  It is not pleuritic, not aggravated by deep breaths.  It is associated with shortness of breath,  Patient has a mild elevation troponin up to 112.  Has been seen by cardiology, started the heparin drip.  Pending decision about heart cath.   Assessment & Plan:   Principal Problem:   Chest pain Active Problems:   COPD (chronic obstructive pulmonary disease) (HCC)   Diabetes mellitus without complication (HCC)   GERD (gastroesophageal reflux disease)   Hypertension   TIA (transient ischemic attack)   Elevated troponin   CAD (coronary artery disease)   HLD (hyperlipidemia)  #1.  Non-STEMI with history of coronary artery disease. Patient chest pain has lasted the whole day yesterday, troponin only mildly elevated.  I will obtain a D-dimer, if elevated, will obtain CT angiogram to rule out PE.  Continue heparin for now.  2.  COPD. Continue current regimen.  3.  Type 2 diabetes. Relatively controlled.  Continue sliding scale insulin.  4.  Essential hypertension. Continue home medicines.   DVT prophylaxis: Heparin Code Status: Full Family Communication: None Disposition Plan:  . Patient came from: Home            . Anticipated d/c place: Home . Barriers to d/c OR conditions which need to be met to effect a safe d/c:   Consultants:   Cardiology.  Procedures:  None Antimicrobials: None  Subjective: Patient still has some chest pain on the left side, but mild, not associate with shortness of breath.  No cough. Denies any nausea vomiting abdominal pain. No fever or chills.  Objective: Vitals:   03/22/20 1540 03/22/20 2027 03/23/20 0533 03/23/20 0758  BP: (!) 161/93 (!) 149/87 (!) 146/88 134/81  Pulse: 74 85 60 73  Resp: '17 18 18 18  ' Temp: 97.9 F (36.6 C) 98.4 F (36.9 C) 98.2 F (36.8 C) 97.8 F (36.6 C)  TempSrc:  Oral Oral   SpO2: 98% 98% 99% 94%  Weight:      Height:        Intake/Output Summary (Last 24 hours) at 03/23/2020 1046 Last data filed at 03/23/2020 1027 Gross per 24 hour  Intake 804.07 ml  Output 1200 ml  Net -395.93 ml   Filed Weights   03/22/20 0437  Weight: 64.4 kg    Examination:  General exam: Appears calm and comfortable  Respiratory system: Clear to auscultation. Respiratory effort normal. Cardiovascular system: S1 & S2 heard, RRR. No JVD, murmurs, rubs, gallops or clicks. No pedal edema. Gastrointestinal system: Abdomen is nondistended, soft and nontender. No organomegaly or masses felt. Normal bowel sounds heard. Central nervous system: Alert and oriented. No focal neurological deficits. Extremities: Symmetric 5 x 5 power. Skin: No rashes, lesions or ulcers Psychiatry: Judgement and insight appear normal. Mood & affect appropriate.     Data Reviewed: I have personally reviewed following labs and imaging  studies  CBC: Recent Labs  Lab 03/22/20 0442 03/23/20 0758  WBC 6.2 7.6  HGB 10.5* 10.7*  HCT 34.5* 34.3*  MCV 76.3* 75.2*  PLT 211 492   Basic Metabolic Panel: Recent Labs  Lab 03/22/20 0442 03/23/20 0758  NA 139 135  K 4.1 4.1  CL 104 100  CO2 28 28  GLUCOSE 102* 104*  BUN 20 22  CREATININE 1.02 0.86  CALCIUM 8.5* 8.6*   GFR: Estimated Creatinine Clearance: 65.5 mL/min (by C-G formula based on SCr of 0.86 mg/dL). Liver Function Tests: No results for input(s): AST, ALT,  ALKPHOS, BILITOT, PROT, ALBUMIN in the last 168 hours. No results for input(s): LIPASE, AMYLASE in the last 168 hours. No results for input(s): AMMONIA in the last 168 hours. Coagulation Profile: Recent Labs  Lab 03/22/20 0708  INR 1.1   Cardiac Enzymes: No results for input(s): CKTOTAL, CKMB, CKMBINDEX, TROPONINI in the last 168 hours. BNP (last 3 results) No results for input(s): PROBNP in the last 8760 hours. HbA1C: Recent Labs    03/22/20 0445  HGBA1C 6.2*   CBG: Recent Labs  Lab 03/22/20 1124 03/22/20 1541 03/22/20 2116 03/23/20 0756  GLUCAP 117* 84 81 108*   Lipid Profile: Recent Labs    03/23/20 0758  CHOL 88  HDL 48  LDLCALC 30  TRIG 50  CHOLHDL 1.8   Thyroid Function Tests: No results for input(s): TSH, T4TOTAL, FREET4, T3FREE, THYROIDAB in the last 72 hours. Anemia Panel: No results for input(s): VITAMINB12, FOLATE, FERRITIN, TIBC, IRON, RETICCTPCT in the last 72 hours. Sepsis Labs: No results for input(s): PROCALCITON, LATICACIDVEN in the last 168 hours.  Recent Results (from the past 240 hour(s))  SARS Coronavirus 2 by RT PCR (hospital order, performed in [facility] hospital lab) Nasopharyngeal Nasopharyngeal Swab     Status: None   Collection Time: 03/22/20  7:08 AM   Specimen: Nasopharyngeal Swab  Result Value Ref Range Status   SARS Coronavirus 2 NEGATIVE NEGATIVE Final    Comment: (NOTE) SARS-CoV-2 target nucleic acids are NOT DETECTED.  The SARS-CoV-2 RNA is generally detectable in upper and lower respiratory specimens during the acute phase of infection. The lowest concentration of SARS-CoV-2 viral copies this assay can detect is 250 copies / mL. A negative result does not preclude SARS-CoV-2 infection and should not be used as the sole basis for treatment or other patient management decisions.  A negative result may occur with improper specimen collection / handling, submission of specimen other than nasopharyngeal swab, presence of  viral mutation(s) within the areas targeted by this assay, and inadequate number of viral copies (<250 copies / mL). A negative result must be combined with clinical observations, patient history, and epidemiological information.  Fact Sheet for Patients:   StrictlyIdeas.no  Fact Sheet for Healthcare Providers: BankingDealers.co.za  This test is not yet approved or  cleared by the [place] FDA and has been authorized for detection and/or diagnosis of SARS-CoV-2 by FDA under an Emergency Use Authorization (EUA).  This EUA will remain in effect (meaning this test can be used) for the duration of the COVID-19 declaration under Section 564(b)(1) of the Act, 21 U.S.C. section 360bbb-3(b)(1), unless the authorization is terminated or revoked sooner.  Performed at [facility], [address], [place], [place] [address]          Radiology Studies: DG Chest 2 View  Result Date: 03/22/2020 CLINICAL DATA:  79 year old male with history of chest pain and shortness of breath. EXAM:  CHEST - 2 VIEW COMPARISON:  Chest x-ray 01/06/2020. FINDINGS: Eventration of the left hemidiaphragm posteriorly. Lung volumes are normal. No consolidative airspace disease. No pleural effusions. No pneumothorax. No pulmonary nodule or mass noted. Pulmonary vasculature and the cardiomediastinal silhouette are within normal limits. Atherosclerosis in the thoracic aorta. IMPRESSION: 1. No radiographic evidence of acute cardiopulmonary disease. 2. Aortic atherosclerosis. 3. Eventration of the left hemidiaphragm. Electronically Signed   By: Vinnie Langton M.D.   On: 03/22/2020 05:43   ECHOCARDIOGRAM COMPLETE  Result Date: 03/22/2020    ECHOCARDIOGRAM REPORT   Patient Name:   [facility] [place] Date of Exam: 03/22/2020 Medical Rec #:  [id number]    Height:       68.0 in Accession #:    [id number]   Weight:       142.0 lb Date of Birth:  1940/12/24    BSA:          1.767  m Patient Age:    38 years     BP:           163/92 mmHg Patient Gender: M            HR:           82 bpm. Exam Location:  [facility] Procedure: 2D Echo Indications:     Chest Pain R07.9  History:         Patient has prior history of Echocardiogram examinations, most                  recent 05/28/2016.  Sonographer:     Arville Go RDCS Referring Phys:  Unknown Foley NIU Diagnosing Phys: Yolonda Kida MD IMPRESSIONS  1. Left ventricular ejection fraction, by estimation, is 50 to 55%. The left ventricle has low normal function. The left ventricle has no regional wall motion abnormalities. Left ventricular diastolic parameters are consistent with Grade III diastolic dysfunction (restrictive).  2. Right ventricular systolic function is normal. The right ventricular size is normal.  3. The mitral valve is normal in structure. No evidence of mitral valve regurgitation.  4. The aortic valve is normal in structure. Aortic valve regurgitation is not visualized. FINDINGS  Left Ventricle: Left ventricular ejection fraction, by estimation, is 50 to 55%. The left ventricle has low normal function. The left ventricle has no regional wall motion abnormalities. The left ventricular internal cavity size was normal in size. There is no left ventricular hypertrophy. Left ventricular diastolic parameters are consistent with Grade III diastolic dysfunction (restrictive). Right Ventricle: The right ventricular size is normal. No increase in right ventricular wall thickness. Right ventricular systolic function is normal. Left Atrium: Left atrial size was normal in size. Right Atrium: Right atrial size was normal in size. Pericardium: There is no evidence of pericardial effusion. Mitral Valve: The mitral valve is normal in structure. No evidence of mitral valve regurgitation. Tricuspid Valve: The tricuspid valve is normal in structure. Tricuspid valve regurgitation is not demonstrated. Aortic Valve: The aortic valve is normal in structure.  Aortic valve regurgitation is not visualized. Aortic valve peak gradient measures 3.6 mmHg. Pulmonic Valve: The pulmonic valve was grossly normal. Pulmonic valve regurgitation is not visualized. Aorta: The aortic root is normal in size and structure. IAS/Shunts: No atrial level shunt detected by color flow Doppler.  LEFT VENTRICLE PLAX 2D LVIDd:         5.08 cm  Diastology LVIDs:         3.50 cm  LV e' lateral:   6.31 cm/s  LV PW:         1.37 cm  LV E/e' lateral: 9.2 LV IVS:        1.28 cm LVOT diam:     2.10 cm LV SV:         54 LV SV Index:   31 LVOT Area:     3.46 cm  RIGHT VENTRICLE RV S prime:     13.40 cm/s TAPSE (M-mode): 2.2 cm LEFT ATRIUM           Index       RIGHT ATRIUM           Index LA diam:      3.10 cm 1.75 cm/m  RA Area:     14.90 cm LA Vol (A2C): 13.4 ml 7.58 ml/m  RA Volume:   38.00 ml  21.51 ml/m LA Vol (A4C): 24.4 ml 13.81 ml/m  AORTIC VALVE                PULMONIC VALVE AV Area (Vmax): 2.74 cm    PV Vmax:       0.91 m/s AV Vmax:        95.00 cm/s  PV Peak grad:  3.3 mmHg AV Peak Grad:   3.6 mmHg LVOT Vmax:      75.10 cm/s LVOT Vmean:     44.800 cm/s LVOT VTI:       0.157 m  AORTA Ao Root diam: 3.60 cm Ao Asc diam:  3.60 cm MITRAL VALVE MV Area (PHT): 4.68 cm     SHUNTS MV Decel Time: 162 msec     Systemic VTI:  0.16 m MV E velocity: 58.30 cm/s   Systemic Diam: 2.10 cm MV A velocity: 102.00 cm/s MV E/A ratio:  0.57 Dwayne D Callwood MD Electronically signed by Yolonda Kida MD Signature Date/Time: 03/22/2020/3:39:36 PM    Final         Scheduled Meds: . amLODipine  10 mg Oral Daily  . atorvastatin  40 mg Oral Daily  . ferrous sulfate  325 mg Oral QODAY  . gabapentin  100 mg Oral BID  . insulin aspart  0-5 Units Subcutaneous QHS  . insulin aspart  0-9 Units Subcutaneous TID WC  . metoprolol succinate  12.5 mg Oral Daily  . mometasone-formoterol  2 puff Inhalation BID  . nicotine  21 mg Transdermal Daily  . pantoprazole  40 mg Oral Daily  . traZODone  50 mg Oral QHS    Continuous Infusions: . heparin 900 Units/hr (03/23/20 0720)     LOS: 0 days    Time spent: 28 minutes    Sharen Hones, MD [facility]   To contact the attending provider between 7A-7P or the covering provider during after hours 7P-7A, please log into the web site [email or web address] and access using universal [place] password for that web site. If you do not have the password, please call the hospital operator.  03/23/2020, 10:46 AM

## 2020-03-23 NOTE — Progress Notes (Signed)
[facility] Cardiology    SUBJECTIVE: Patient states to be doing reasonably well denies any worsening chest pain he has had episodes on and off resting comfortably feels reasonably well.   Vitals:   03/23/20 0533 03/23/20 0758 03/23/20 1206 03/23/20 1606  BP: (!) 146/88 134/81 (!) 158/82 (!) 154/79  Pulse: 60 73 72 77  Resp: 18 18 18 18   Temp: 98.2 F (36.8 C) 97.8 F (36.6 C) 98.4 F (36.9 C) 98.2 F (36.8 C)  TempSrc: Oral     SpO2: 99% 94% 96% 96%  Weight:      Height:         Intake/Output Summary (Last 24 hours) at 03/23/2020 1858 Last data filed at 03/23/2020 1710 Gross per 24 hour  Intake 560.98 ml  Output 1575 ml  Net -1014.02 ml      PHYSICAL EXAM  General: Well developed, well nourished, in no acute distress HEENT:  Normocephalic and atramatic Neck:  No JVD.  Lungs: Clear bilaterally to auscultation and percussion. Heart: HRRR . Normal S1 and S2 without gallops or murmurs.  Abdomen: Bowel sounds are positive, abdomen soft and non-tender  Msk:  Back normal, normal gait. Normal strength and tone for age. Extremities: No clubbing, cyanosis or edema.   Neuro: Alert and oriented X 3. Psych:  Good affect, responds appropriately   LABS: Basic Metabolic Panel: Recent Labs    03/22/20 0442 03/23/20 0758  NA 139 135  K 4.1 4.1  CL 104 100  CO2 28 28  GLUCOSE 102* 104*  BUN 20 22  CREATININE 1.02 0.86  CALCIUM 8.5* 8.6*   Liver Function Tests: No results for input(s): AST, ALT, ALKPHOS, BILITOT, PROT, ALBUMIN in the last 72 hours. No results for input(s): LIPASE, AMYLASE in the last 72 hours. CBC: Recent Labs    03/22/20 0442 03/23/20 0758  WBC 6.2 7.6  HGB 10.5* 10.7*  HCT 34.5* 34.3*  MCV 76.3* 75.2*  PLT 211 196   Cardiac Enzymes: No results for input(s): CKTOTAL, CKMB, CKMBINDEX, TROPONINI in the last 72 hours. BNP: Invalid input(s): POCBNP D-Dimer: No results for input(s): DDIMER in the last 72 hours. Hemoglobin A1C: Recent Labs     03/22/20 0445  HGBA1C 6.2*   Fasting Lipid Panel: Recent Labs    03/23/20 0758  CHOL 88  HDL 48  LDLCALC 30  TRIG 50  CHOLHDL 1.8   Thyroid Function Tests: No results for input(s): TSH, T4TOTAL, T3FREE, THYROIDAB in the last 72 hours.  Invalid input(s): FREET3 Anemia Panel: No results for input(s): VITAMINB12, FOLATE, FERRITIN, TIBC, IRON, RETICCTPCT in the last 72 hours.  DG Chest 2 View  Result Date: 03/22/2020 CLINICAL DATA:  79 year old male with history of chest pain and shortness of breath. EXAM: CHEST - 2 VIEW COMPARISON:  Chest x-ray 01/06/2020. FINDINGS: Eventration of the left hemidiaphragm posteriorly. Lung volumes are normal. No consolidative airspace disease. No pleural effusions. No pneumothorax. No pulmonary nodule or mass noted. Pulmonary vasculature and the cardiomediastinal silhouette are within normal limits. Atherosclerosis in the thoracic aorta. IMPRESSION: 1. No radiographic evidence of acute cardiopulmonary disease. 2. Aortic atherosclerosis. 3. Eventration of the left hemidiaphragm. Electronically Signed   By: Vinnie Langton M.D.   On: 03/22/2020 05:43   ECHOCARDIOGRAM COMPLETE  Result Date: 03/22/2020    ECHOCARDIOGRAM REPORT   Patient Name:   [facility] [place] Date of Exam: 03/22/2020 Medical Rec #:  [id number]    Height:       68.0 in Accession #:  [id number]   Weight:       142.0 lb Date of Birth:  1941-09-16    BSA:          1.767 m Patient Age:    79 years     BP:           163/92 mmHg Patient Gender: M            HR:           82 bpm. Exam Location:  [facility] Procedure: 2D Echo Indications:     Chest Pain R07.9  History:         Patient has prior history of Echocardiogram examinations, most                  recent 05/28/2016.  Sonographer:     Arville Go RDCS Referring Phys:  Unknown Foley NIU Diagnosing Phys: Yolonda Kida MD IMPRESSIONS  1. Left ventricular ejection fraction, by estimation, is 50 to 55%. The left ventricle has low normal function. The left  ventricle has no regional wall motion abnormalities. Left ventricular diastolic parameters are consistent with Grade III diastolic dysfunction (restrictive).  2. Right ventricular systolic function is normal. The right ventricular size is normal.  3. The mitral valve is normal in structure. No evidence of mitral valve regurgitation.  4. The aortic valve is normal in structure. Aortic valve regurgitation is not visualized. FINDINGS  Left Ventricle: Left ventricular ejection fraction, by estimation, is 50 to 55%. The left ventricle has low normal function. The left ventricle has no regional wall motion abnormalities. The left ventricular internal cavity size was normal in size. There is no left ventricular hypertrophy. Left ventricular diastolic parameters are consistent with Grade III diastolic dysfunction (restrictive). Right Ventricle: The right ventricular size is normal. No increase in right ventricular wall thickness. Right ventricular systolic function is normal. Left Atrium: Left atrial size was normal in size. Right Atrium: Right atrial size was normal in size. Pericardium: There is no evidence of pericardial effusion. Mitral Valve: The mitral valve is normal in structure. No evidence of mitral valve regurgitation. Tricuspid Valve: The tricuspid valve is normal in structure. Tricuspid valve regurgitation is not demonstrated. Aortic Valve: The aortic valve is normal in structure. Aortic valve regurgitation is not visualized. Aortic valve peak gradient measures 3.6 mmHg. Pulmonic Valve: The pulmonic valve was grossly normal. Pulmonic valve regurgitation is not visualized. Aorta: The aortic root is normal in size and structure. IAS/Shunts: No atrial level shunt detected by color flow Doppler.  LEFT VENTRICLE PLAX 2D LVIDd:         5.08 cm  Diastology LVIDs:         3.50 cm  LV e' lateral:   6.31 cm/s LV PW:         1.37 cm  LV E/e' lateral: 9.2 LV IVS:        1.28 cm LVOT diam:     2.10 cm LV SV:         54 LV  SV Index:   31 LVOT Area:     3.46 cm  RIGHT VENTRICLE RV S prime:     13.40 cm/s TAPSE (M-mode): 2.2 cm LEFT ATRIUM           Index       RIGHT ATRIUM           Index LA diam:      3.10 cm 1.75 cm/m  RA Area:     14.90 cm  LA Vol (A2C): 13.4 ml 7.58 ml/m  RA Volume:   38.00 ml  21.51 ml/m LA Vol (A4C): 24.4 ml 13.81 ml/m  AORTIC VALVE                PULMONIC VALVE AV Area (Vmax): 2.74 cm    PV Vmax:       0.91 m/s AV Vmax:        95.00 cm/s  PV Peak grad:  3.3 mmHg AV Peak Grad:   3.6 mmHg LVOT Vmax:      75.10 cm/s LVOT Vmean:     44.800 cm/s LVOT VTI:       0.157 m  AORTA Ao Root diam: 3.60 cm Ao Asc diam:  3.60 cm MITRAL VALVE MV Area (PHT): 4.68 cm     SHUNTS MV Decel Time: 162 msec     Systemic VTI:  0.16 m MV E velocity: 58.30 cm/s   Systemic Diam: 2.10 cm MV A velocity: 102.00 cm/s MV E/A ratio:  0.57 Fatih Stalvey D Megin Consalvo MD Electronically signed by Yolonda Kida MD Signature Date/Time: 03/22/2020/3:39:36 PM    Final      Echo preserved left ventricular function between 50 and 55%  TELEMETRY: Normal sinus rhythm 55  ASSESSMENT AND PLAN:  Principal Problem:   Chest pain Active Problems:   COPD (chronic obstructive pulmonary disease) (HCC)   Diabetes mellitus without complication (HCC)   GERD (gastroesophageal reflux disease)   Hypertension   TIA (transient ischemic attack)   Elevated troponin   CAD (coronary artery disease)   HLD (hyperlipidemia)   NSTEMI (non-ST elevated myocardial infarction) ([place])    Plan Continue telemetry monitoring control Continue diabetes management Maintain patient reflux medications Hypertension is adequately controlled at this point Elevated troponins demand ischemia versus non-STEMI Will defer cardiac cath at this point in favor of a functional study tomorrow   Yolonda Kida, MD 03/23/2020 6:58 PM

## 2020-03-24 ENCOUNTER — Inpatient Hospital Stay: Payer: Medicare Other

## 2020-03-24 DIAGNOSIS — I10 Essential (primary) hypertension: Secondary | ICD-10-CM

## 2020-03-24 DIAGNOSIS — Z720 Tobacco use: Secondary | ICD-10-CM

## 2020-03-24 LAB — NM MYOCAR MULTI W/SPECT W/WALL MOTION / EF
Estimated workload: 1 METS
Exercise duration (min): 1 min
Exercise duration (sec): 3 s
LV dias vol: 95 mL (ref 62–150)
LV sys vol: 54 mL
Peak HR: 105 {beats}/min
Percent HR: 73 %
Rest HR: 73 {beats}/min
SDS: 0
SRS: 13
SSS: 9
TID: 1.01

## 2020-03-24 LAB — CBC
HCT: 35.1 % — ABNORMAL LOW (ref 39.0–52.0)
Hemoglobin: 11.2 g/dL — ABNORMAL LOW (ref 13.0–17.0)
MCH: 23.6 pg — ABNORMAL LOW (ref 26.0–34.0)
MCHC: 31.9 g/dL (ref 30.0–36.0)
MCV: 74.1 fL — ABNORMAL LOW (ref 80.0–100.0)
Platelets: 197 10*3/uL (ref 150–400)
RBC: 4.74 MIL/uL (ref 4.22–5.81)
RDW: 19.5 % — ABNORMAL HIGH (ref 11.5–15.5)
WBC: 7 10*3/uL (ref 4.0–10.5)
nRBC: 0 % (ref 0.0–0.2)

## 2020-03-24 LAB — GLUCOSE, CAPILLARY
Glucose-Capillary: 131 mg/dL — ABNORMAL HIGH (ref 70–99)
Glucose-Capillary: 127 mg/dL — ABNORMAL HIGH (ref 70–99)

## 2020-03-24 LAB — HEPARIN LEVEL (UNFRACTIONATED)
Heparin Unfractionated: 0.72 IU/mL — ABNORMAL HIGH (ref 0.30–0.70)
Heparin Unfractionated: 0.56 IU/mL (ref 0.30–0.70)

## 2020-03-24 MED ORDER — ADULT MULTIVITAMIN W/MINERALS CH: 1.0000 | ORAL_TABLET | Freq: Every day | ORAL | Status: DC

## 2020-03-24 MED ORDER — TECHNETIUM TC 99M TETROFOSMIN IV KIT
31.1800 | PACK | Freq: Once | INTRAVENOUS | Status: AC | PRN
  Administered 2020-03-24: 31.18 via INTRAVENOUS

## 2020-03-24 MED ORDER — REGADENOSON 0.4 MG/5ML IV SOLN
0.4000 mg | Freq: Once | INTRAVENOUS | Status: AC
  Administered 2020-03-24: 0.4 mg via INTRAVENOUS
  Filled 2020-03-24: qty 5

## 2020-03-24 MED ORDER — TECHNETIUM TC 99M TETROFOSMIN IV KIT
10.0000 | PACK | Freq: Once | INTRAVENOUS | Status: AC | PRN
  Administered 2020-03-24: 10.519 via INTRAVENOUS

## 2020-03-24 MED ORDER — ENSURE ENLIVE PO LIQD: 237.0000 mL | Freq: Three times a day (TID) | ORAL | Status: DC

## 2020-03-24 MED ORDER — NICOTINE 21 MG/24HR TD PT24: 21.0000 mg | MEDICATED_PATCH | Freq: Every day | TRANSDERMAL | 0 refills | Status: AC

## 2020-03-24 NOTE — Progress Notes (Signed)
Discharge instructions explained to family and patient. Verbalized understanding. IVs removed. All belongings returned to patient. VSS. No signs of distress. Patient escorted off unit by volunteer staff.

## 2020-03-24 NOTE — Progress Notes (Addendum)
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  GOAL:   Patient will meet greater than or equal to 90% of their needs  MONITOR:   PO intake, Supplement acceptance, Labs, Weight trends, Skin, I & O's  REASON FOR ASSESSMENT:   Malnutrition Screening Tool    ASSESSMENT:   79 y.o. male with medical history significant of hypertension, diabetes mellitus, COPD, TIA, GERD, CAD, erosive gastropathy and tobacco abuse who presents with chest pain.  Met with pt in room today. Pt reports poor appetite and oral intake for several months r/t early satiety. Pt are only bites of his lunch today. 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 Pt reports that he has been losing weight over the past few months because he has been unable to eat a lot of foods. Pt does report that he has Ensure in the refrigerator at home but he does not drink it regularly. Pt is edentulous but he does wear dentures. Per chart, pt is down 10lbs(7%) over the past 3 months. RD discussed with pt the importance of adequate nutrition needed to preserve lean muscle. Pt is willing to drink Ensure while in hospital. Recommended pt continue supplements after discharge. Pt reports that he has been working with an outpatient RD to gain weight.   Medications reviewed and include: ferrous sulfate, insulin, nicotine, protonix, heparin  Labs reviewed:   NUTRITION - FOCUSED PHYSICAL EXAM:    Most Recent Value  Orbital Region Moderate depletion  Upper Arm  Region Severe depletion  Thoracic and Lumbar Region Severe depletion  Buccal Region Severe depletion  Temple Region Severe depletion  Clavicle Bone Region Severe depletion  Clavicle and Acromion Bone Region Severe depletion  Scapular Bone Region Severe depletion  Dorsal Hand Severe depletion  Patellar Region Severe depletion  Anterior Thigh Region Severe depletion  Posterior Calf Region Severe depletion  Edema (RD Assessment) None  Hair Reviewed  Eyes Reviewed  Mouth Reviewed  Skin Reviewed  Nails Reviewed     Diet Order:   Diet Order            DIET DYS 3 Room service appropriate? Yes; Fluid consistency: Thin  Diet effective now           Diet - low sodium heart healthy                EDUCATION NEEDS:   Education needs have been addressed  Skin:  Skin Assessment: Reviewed RN Assessment  Last BM:  7/3  Height:   Ht Readings from Last 1 Encounters:  03/22/20 '5\' 8"'  (1.727 m)    Weight:   Wt Readings from Last 1 Encounters:  03/24/20 62 kg    Ideal Body Weight:  70 kg  BMI:  Body mass index is 20.79 kg/m.  Estimated Nutritional Needs:   Kcal:  1700-2000kcal/day  Protein:  85-100g/day  Fluid:  >1.6L/day  Koleen Distance MS, RD, LDN Please refer to [facility] for RD and/or RD on-call/weekend/after hours pager

## 2020-03-24 NOTE — Discharge Summary (Signed)
Physician Discharge Summary  Patient ID: [name] MRN: [id number] DOB/AGE: 1941/01/08 79 y.o.  Admit date: 03/22/2020 Discharge date: 03/24/2020  Admission Diagnoses:  Discharge Diagnoses:  Principal Problem:   Chest pain Active Problems:   COPD (chronic obstructive pulmonary disease) (HCC)   Diabetes mellitus without complication (HCC)   GERD (gastroesophageal reflux disease)   Hypertension   TIA (transient ischemic attack)   Elevated troponin   CAD (coronary artery disease)   HLD (hyperlipidemia)   NSTEMI (non-ST elevated myocardial infarction) ([place])   Tobacco abuse   Discharged Condition: good  Hospital Course:  From Dr. Blaine Hamper H&P: [name] a 79 y.o.malewith medical history significant ofhypertension, diabetes mellitus, COPD, TIA, GERD, CAD, erosive gastropathy, pulmonologist, tobacco abuse, who presents with chest pain.  Patient states that his chest pain started in the early morning when he woke up from sleeping. It is located in the left side of chest, constant, sharp and dull, 8 out of 10 in severity initially, currently 4 out of 10 severity, nonradiating. It is not pleuritic, not aggravated by deep breaths. It is associated with shortness of breath,  Patient has a mild elevation troponin up to 112.  Has been seen by cardiology, started the heparin drip.    #1.  Non-STEMI with history of coronary artery disease. Patient had a mild elevation of troponin.  He is treated with heparin.  He had a nuclear stress test ordered by cardiology, did not show significant abnormality.  Is considered low risk.  He will be followed by cardiology in the near future. Patient also had elevated D-dimer.  However, overall clinical picture does not support a PE.  Patient does not have any significant leg edema.  Pain since has resolved.  He has allergy to IV dye, could not get a VQ scan as patient was prioritized for nuclear stress test. I feel that patient PE risk is not high  enough to justify further studies.  I will recommend the patient follow-up with his family doctor in the near future.  2.  COPD. Continue home regimen.  3.  Type 2 diabetes. Relatively controlled.     4.  Essential hypertension. Continue home medicines.  Consults: cardiology  Significant Diagnostic Studies:  Stress test:   The study is normal.  This is a low risk study.  The left ventricular ejection fraction is normal (55-65%).  There was no ST segment deviation noted during stress.   Negative lexiscan stress LV funciton appears normal No reversible ischemia Low risk study    Treatments: Stress test.  Discharge Exam: Blood pressure 128/83, pulse 85, temperature 97.7 F (36.5 C), temperature source Oral, resp. rate 18, height 5\' 8"  (1.727 m), weight 62 kg, SpO2 96 %. General appearance: alert and cooperative Resp: Decreased breathing sounds without crackles or wheezes. Cardio: regular rate and rhythm, S1, S2 normal, no murmur, click, rub or gallop GI: soft, non-tender; bowel sounds normal; no masses,  no organomegaly Extremities: extremities normal, atraumatic, no cyanosis or edema  Disposition: Discharge disposition: 01-Home or Self Care       Discharge Instructions    Diet - low sodium heart healthy   Complete by: As directed    Increase activity slowly   Complete by: As directed      Allergies as of 03/24/2020      Reactions   Lisinopril Swelling   Lip and check swelling   Aspirin    Held due to anemia and fall risk.   Codeine    Ivp  Dye [iodinated Diagnostic Agents] Hives, Other (See Comments)   dizzy   Percocet [oxycodone-acetaminophen] Hives, Nausea Only      Medication List    TAKE these medications   Acetaminophen 500 MG capsule Take by mouth.   amLODipine 10 MG tablet Commonly known as: NORVASC Take 1 tablet (10 mg total) by mouth daily.   atorvastatin 40 MG tablet Commonly known as: LIPITOR Take 40 mg by mouth daily.    ferrous sulfate 325 (65 FE) MG tablet Take 325 mg by mouth every other day.   gabapentin 100 MG capsule Commonly known as: NEURONTIN Take 100 mg by mouth 2 (two) times daily.   MELATONIN PO Take 5 mg by mouth at bedtime as needed.   metFORMIN 850 MG tablet Commonly known as: GLUCOPHAGE Take 850 mg by mouth daily with breakfast.   metoprolol succinate 25 MG 24 hr tablet Commonly known as: TOPROL-XL Take 12.5 mg by mouth daily.   nicotine 21 mg/24hr patch Commonly known as: NICODERM CQ - dosed in mg/24 hours Place 1 patch (21 mg total) onto the skin daily for 21 days. Start taking on: March 25, 2020   ondansetron 4 MG disintegrating tablet Commonly known as: Zofran ODT Take 1 tablet (4 mg total) by mouth every 8 (eight) hours as needed for nausea or vomiting.   pantoprazole 40 MG tablet Commonly known as: PROTONIX Take 40 mg by mouth daily.   ProAir HFA 108 (90 Base) MCG/ACT inhaler Generic drug: albuterol Inhale 2 puffs every 6 (six) hours as needed into the lungs.   Symbicort 160-4.5 MCG/ACT inhaler Generic drug: budesonide-formoterol Inhale 2 puffs into the lungs 2 (two) times daily.   traZODone 50 MG tablet Commonly known as: DESYREL Take 1 tablet (50 mg total) by mouth at bedtime.       Follow-up Information    Joanie Coddington, MD Follow up in 1 week(s).   Specialty: Family Medicine Contact information: [place] [address] [phone]        Yolonda Kida, MD On 04/03/2020.   Specialties: Cardiology, Internal Medicine Why: At 9:45am Contact information: [place] [address] [phone]               Signed: Sharen Hones 03/24/2020, 3:19 PM

## 2020-03-24 NOTE — Progress Notes (Signed)
[place] for heparin Indication: chest pain/ACS  Allergies  Allergen Reactions  . Lisinopril Swelling    Lip and check swelling  . Aspirin     Held due to anemia and fall risk.  . Codeine   . Ivp Dye [Iodinated Diagnostic Agents] Hives and Other (See Comments)    dizzy  . Percocet [Oxycodone-Acetaminophen] Hives and Nausea Only    Patient Measurements: Height: 5\' 8"  (172.7 cm) Weight: 62 kg (136 lb 11.2 oz) IBW/kg (Calculated) : 68.4 Heparin Dosing Weight: 64.4 kg  Vital Signs: Temp: 97.8 F (36.6 C) (07/06 0559) BP: 154/97 (07/06 0559) Pulse Rate: 78 (07/06 0559)  Labs: Recent Labs    03/22/20 0442 03/22/20 0442 03/22/20 0625 03/22/20 0708 03/22/20 0804 03/22/20 1054 03/22/20 1442 03/22/20 1442 03/22/20 2055 03/23/20 0758 03/24/20 0551  HGB 10.5*   < >  --   --   --   --   --   --   --  10.7* 11.2*  HCT 34.5*  --   --   --   --   --   --   --   --  34.3* 35.1*  PLT 211  --   --   --   --   --   --   --   --  196 197  APTT  --   --   --  27  --   --   --   --   --   --   --   LABPROT  --   --   --  13.5  --   --   --   --   --   --   --   INR  --   --   --  1.1  --   --   --   --   --   --   --   HEPARINUNFRC  --   --   --   --   --   --  0.33   < > 0.35 0.38 0.72*  CREATININE 1.02  --   --   --   --   --   --   --   --  0.86  --   TROPONINIHS 116*  --    < >  --  102* 110* 112*  --   --   --   --    < > = values in this interval not displayed.    Estimated Creatinine Clearance: 63.1 mL/min (by C-G formula based on SCr of 0.86 mg/dL).   Medical History: Past Medical History:  Diagnosis Date  . Atypical chest pain    a. 05/2011 Neg MV; b. 04/2013 Neg MV; c. 04/2019 Echo: EF 55-60%, mild BAE. Degen MV dzs. Nl RV fxn; d. 06/2019 MV: EF 55%, small, mild, fixed apical and apical ant defect - probable artifact. No ischemia.  Marland Kitchen COPD (chronic obstructive pulmonary disease) ([place])   . Coronary artery calcification seen on CT  scan    a. 07/2018 CT Chest - cor Ca2+.  . Diabetes mellitus without complication ([place])   . Erosive gastropathy    a. 06/2019 EGD  . GERD (gastroesophageal reflux disease)   . Hypertension   . Pulmonary nodules    a. 07/2018 Chest CT: RML and RLL nodules.  Marland Kitchen TIA (transient ischemic attack)     Medications:  Scheduled:  . amLODipine  10 mg Oral Daily  . atorvastatin  40 mg Oral Daily  . ferrous sulfate  325 mg Oral QODAY  . gabapentin  100 mg Oral BID  . insulin aspart  0-5 Units Subcutaneous QHS  . insulin aspart  0-9 Units Subcutaneous TID WC  . metoprolol succinate  12.5 mg Oral Daily  . mometasone-formoterol  2 puff Inhalation BID  . nicotine  21 mg Transdermal Daily  . pantoprazole  40 mg Oral Daily  . traZODone  50 mg Oral QHS    Assessment: Patient w/ h/o CAD, HTN, PVC's arrives w/ troponin of 116 w/ EKG showing SR w/ PVCs. Baseline CBC WNL, aPTT/INR pending. Patient not on PTA anticoagulation. Patient is being started on heparin for management of NSTEMI.  7/4 AM: Heparin infusion started @ 900 units/hr 7/4 1442 HL 0.33, therapeutic x 1, no change 7/4 2100 HL 0.35, therapeutic x 2, no change 7/5 0758 HL 0.38, no rate change 7/6 0551 HL 0.72, SUPRAtherapeutic.  Goal of Therapy:  Heparin level 0.3-0.7 units/ml Monitor platelets by anticoagulation protocol: Yes   Plan:  7/6 0551 HL 0.72, SUPRAtherapeutic. CBC stable.  Will decrease Heparin rate to 850 units/hr. Recheck HL in 8 hours.  Daily CBC while on Heparin drip.  Hart Robinsons, PharmD 03/24/2020 7:01 AM

## 2020-03-24 NOTE — Discharge Instructions (Signed)
1.  Follow-up with PCP in 1 week. 2.  Follow-up with cardiology in 2 to 3 weeks.

## 2020-03-25 DIAGNOSIS — M48061 Spinal stenosis, lumbar region without neurogenic claudication: Secondary | ICD-10-CM | POA: Diagnosis not present

## 2020-03-25 DIAGNOSIS — I6523 Occlusion and stenosis of bilateral carotid arteries: Secondary | ICD-10-CM | POA: Diagnosis not present

## 2020-03-25 DIAGNOSIS — R1310 Dysphagia, unspecified: Secondary | ICD-10-CM | POA: Diagnosis not present

## 2020-03-25 DIAGNOSIS — G319 Degenerative disease of nervous system, unspecified: Secondary | ICD-10-CM | POA: Diagnosis not present

## 2020-03-25 DIAGNOSIS — N182 Chronic kidney disease, stage 2 (mild): Secondary | ICD-10-CM | POA: Diagnosis not present

## 2020-03-25 DIAGNOSIS — D509 Iron deficiency anemia, unspecified: Secondary | ICD-10-CM | POA: Diagnosis not present

## 2020-03-25 DIAGNOSIS — I10 Essential (primary) hypertension: Secondary | ICD-10-CM | POA: Diagnosis not present

## 2020-03-25 DIAGNOSIS — J449 Chronic obstructive pulmonary disease, unspecified: Secondary | ICD-10-CM | POA: Diagnosis not present

## 2020-03-25 DIAGNOSIS — Z7984 Long term (current) use of oral hypoglycemic drugs: Secondary | ICD-10-CM | POA: Diagnosis not present

## 2020-03-25 DIAGNOSIS — I2510 Atherosclerotic heart disease of native coronary artery without angina pectoris: Secondary | ICD-10-CM | POA: Diagnosis not present

## 2020-03-25 DIAGNOSIS — E1122 Type 2 diabetes mellitus with diabetic chronic kidney disease: Secondary | ICD-10-CM | POA: Diagnosis not present

## 2020-03-25 DIAGNOSIS — G8929 Other chronic pain: Secondary | ICD-10-CM | POA: Diagnosis not present

## 2020-03-25 DIAGNOSIS — M5136 Other intervertebral disc degeneration, lumbar region: Secondary | ICD-10-CM | POA: Diagnosis not present

## 2020-03-30 DIAGNOSIS — R1310 Dysphagia, unspecified: Secondary | ICD-10-CM | POA: Diagnosis not present

## 2020-03-30 DIAGNOSIS — Z7984 Long term (current) use of oral hypoglycemic drugs: Secondary | ICD-10-CM | POA: Diagnosis not present

## 2020-03-30 DIAGNOSIS — J449 Chronic obstructive pulmonary disease, unspecified: Secondary | ICD-10-CM | POA: Diagnosis not present

## 2020-03-30 DIAGNOSIS — E1122 Type 2 diabetes mellitus with diabetic chronic kidney disease: Secondary | ICD-10-CM | POA: Diagnosis not present

## 2020-03-30 DIAGNOSIS — G8929 Other chronic pain: Secondary | ICD-10-CM | POA: Diagnosis not present

## 2020-03-30 DIAGNOSIS — G319 Degenerative disease of nervous system, unspecified: Secondary | ICD-10-CM | POA: Diagnosis not present

## 2020-03-30 DIAGNOSIS — I6523 Occlusion and stenosis of bilateral carotid arteries: Secondary | ICD-10-CM | POA: Diagnosis not present

## 2020-03-30 DIAGNOSIS — N182 Chronic kidney disease, stage 2 (mild): Secondary | ICD-10-CM | POA: Diagnosis not present

## 2020-03-30 DIAGNOSIS — D509 Iron deficiency anemia, unspecified: Secondary | ICD-10-CM | POA: Diagnosis not present

## 2020-03-30 DIAGNOSIS — M5136 Other intervertebral disc degeneration, lumbar region: Secondary | ICD-10-CM | POA: Diagnosis not present

## 2020-03-30 DIAGNOSIS — M48061 Spinal stenosis, lumbar region without neurogenic claudication: Secondary | ICD-10-CM | POA: Diagnosis not present

## 2020-03-30 DIAGNOSIS — I10 Essential (primary) hypertension: Secondary | ICD-10-CM | POA: Diagnosis not present

## 2020-03-30 DIAGNOSIS — I2510 Atherosclerotic heart disease of native coronary artery without angina pectoris: Secondary | ICD-10-CM | POA: Diagnosis not present

## 2020-03-31 DIAGNOSIS — I2510 Atherosclerotic heart disease of native coronary artery without angina pectoris: Secondary | ICD-10-CM | POA: Diagnosis not present

## 2020-03-31 DIAGNOSIS — F419 Anxiety disorder, unspecified: Secondary | ICD-10-CM | POA: Insufficient documentation

## 2020-03-31 DIAGNOSIS — M48061 Spinal stenosis, lumbar region without neurogenic claudication: Secondary | ICD-10-CM | POA: Diagnosis not present

## 2020-04-03 DIAGNOSIS — I6523 Occlusion and stenosis of bilateral carotid arteries: Secondary | ICD-10-CM | POA: Diagnosis not present

## 2020-04-03 DIAGNOSIS — I10 Essential (primary) hypertension: Secondary | ICD-10-CM | POA: Diagnosis not present

## 2020-04-03 DIAGNOSIS — I2510 Atherosclerotic heart disease of native coronary artery without angina pectoris: Secondary | ICD-10-CM | POA: Diagnosis not present

## 2020-04-03 DIAGNOSIS — I214 Non-ST elevation (NSTEMI) myocardial infarction: Secondary | ICD-10-CM | POA: Diagnosis not present

## 2020-04-03 DIAGNOSIS — I519 Heart disease, unspecified: Secondary | ICD-10-CM | POA: Diagnosis not present

## 2020-04-15 DIAGNOSIS — I214 Non-ST elevation (NSTEMI) myocardial infarction: Secondary | ICD-10-CM | POA: Diagnosis not present

## 2020-04-15 DIAGNOSIS — R1310 Dysphagia, unspecified: Secondary | ICD-10-CM | POA: Diagnosis not present

## 2020-04-15 DIAGNOSIS — M48061 Spinal stenosis, lumbar region without neurogenic claudication: Secondary | ICD-10-CM | POA: Diagnosis not present

## 2020-04-15 DIAGNOSIS — R0989 Other specified symptoms and signs involving the circulatory and respiratory systems: Secondary | ICD-10-CM | POA: Diagnosis not present

## 2020-04-16 ENCOUNTER — Emergency Department: Payer: Medicare Other

## 2020-04-16 ENCOUNTER — Observation Stay
Admission: EM | Admit: 2020-04-16 | Discharge: 2020-04-18 | Disposition: A | Discharge status: Home / Self Care | Payer: Medicare Other | Attending: Internal Medicine | Admitting: Internal Medicine

## 2020-04-16 ENCOUNTER — Other Ambulatory Visit: Payer: Self-pay

## 2020-04-16 ENCOUNTER — Encounter: Payer: Self-pay | Admitting: Internal Medicine

## 2020-04-16 DIAGNOSIS — Z7982 Long term (current) use of aspirin: Secondary | ICD-10-CM | POA: Diagnosis not present

## 2020-04-16 DIAGNOSIS — Z743 Need for continuous supervision: Secondary | ICD-10-CM | POA: Diagnosis not present

## 2020-04-16 DIAGNOSIS — R069 Unspecified abnormalities of breathing: Secondary | ICD-10-CM | POA: Diagnosis not present

## 2020-04-16 DIAGNOSIS — Z7984 Long term (current) use of oral hypoglycemic drugs: Secondary | ICD-10-CM | POA: Diagnosis not present

## 2020-04-16 DIAGNOSIS — I2510 Atherosclerotic heart disease of native coronary artery without angina pectoris: Secondary | ICD-10-CM | POA: Diagnosis present

## 2020-04-16 DIAGNOSIS — R748 Abnormal levels of other serum enzymes: Secondary | ICD-10-CM | POA: Diagnosis not present

## 2020-04-16 DIAGNOSIS — F17210 Nicotine dependence, cigarettes, uncomplicated: Secondary | ICD-10-CM | POA: Diagnosis not present

## 2020-04-16 DIAGNOSIS — K219 Gastro-esophageal reflux disease without esophagitis: Secondary | ICD-10-CM | POA: Diagnosis not present

## 2020-04-16 DIAGNOSIS — G459 Transient cerebral ischemic attack, unspecified: Secondary | ICD-10-CM

## 2020-04-16 DIAGNOSIS — R7989 Other specified abnormal findings of blood chemistry: Secondary | ICD-10-CM | POA: Diagnosis present

## 2020-04-16 DIAGNOSIS — R778 Other specified abnormalities of plasma proteins: Secondary | ICD-10-CM | POA: Diagnosis present

## 2020-04-16 DIAGNOSIS — E119 Type 2 diabetes mellitus without complications: Secondary | ICD-10-CM

## 2020-04-16 DIAGNOSIS — R0602 Shortness of breath: Secondary | ICD-10-CM | POA: Diagnosis not present

## 2020-04-16 DIAGNOSIS — I10 Essential (primary) hypertension: Secondary | ICD-10-CM | POA: Diagnosis present

## 2020-04-16 DIAGNOSIS — E785 Hyperlipidemia, unspecified: Secondary | ICD-10-CM | POA: Diagnosis present

## 2020-04-16 DIAGNOSIS — J441 Chronic obstructive pulmonary disease with (acute) exacerbation: Secondary | ICD-10-CM | POA: Diagnosis not present

## 2020-04-16 DIAGNOSIS — R0902 Hypoxemia: Secondary | ICD-10-CM | POA: Diagnosis not present

## 2020-04-16 DIAGNOSIS — J449 Chronic obstructive pulmonary disease, unspecified: Secondary | ICD-10-CM | POA: Insufficient documentation

## 2020-04-16 DIAGNOSIS — Z720 Tobacco use: Secondary | ICD-10-CM | POA: Diagnosis present

## 2020-04-16 DIAGNOSIS — R42 Dizziness and giddiness: Secondary | ICD-10-CM

## 2020-04-16 DIAGNOSIS — Z79899 Other long term (current) drug therapy: Secondary | ICD-10-CM | POA: Insufficient documentation

## 2020-04-16 DIAGNOSIS — D509 Iron deficiency anemia, unspecified: Secondary | ICD-10-CM | POA: Diagnosis present

## 2020-04-16 LAB — HIV ANTIBODY (ROUTINE TESTING W REFLEX): HIV Screen 4th Generation wRfx: NONREACTIVE

## 2020-04-16 LAB — CBC
HCT: 36.5 % — ABNORMAL LOW (ref 39.0–52.0)
Hemoglobin: 11.6 g/dL — ABNORMAL LOW (ref 13.0–17.0)
MCH: 24.3 pg — ABNORMAL LOW (ref 26.0–34.0)
MCHC: 31.8 g/dL (ref 30.0–36.0)
MCV: 76.5 fL — ABNORMAL LOW (ref 80.0–100.0)
Platelets: 185 10*3/uL (ref 150–400)
RBC: 4.77 MIL/uL (ref 4.22–5.81)
RDW: 18.6 % — ABNORMAL HIGH (ref 11.5–15.5)
WBC: 6.9 10*3/uL (ref 4.0–10.5)
nRBC: 0 % (ref 0.0–0.2)

## 2020-04-16 LAB — BASIC METABOLIC PANEL
Anion gap: 11 (ref 5–15)
BUN: 22 mg/dL (ref 8–23)
CO2: 27 mmol/L (ref 22–32)
Calcium: 9.1 mg/dL (ref 8.9–10.3)
Chloride: 94 mmol/L — ABNORMAL LOW (ref 98–111)
Creatinine, Ser: 1.08 mg/dL (ref 0.61–1.24)
GFR calc Af Amer: >60 mL/min (ref >60)
GFR calc non Af Amer: >60 mL/min (ref >60)
Glucose, Bld: 245 mg/dL — ABNORMAL HIGH (ref 70–99)
Potassium: 3.6 mmol/L (ref 3.5–5.1)
Sodium: 132 mmol/L — ABNORMAL LOW (ref 135–145)

## 2020-04-16 LAB — URINALYSIS, COMPLETE (UACMP) WITH MICROSCOPIC
Bilirubin Urine: NEGATIVE
Glucose, UA: NEGATIVE mg/dL
Ketones, ur: NEGATIVE mg/dL
Leukocytes,Ua: NEGATIVE
Nitrite: NEGATIVE
Protein, ur: 30 mg/dL — AB
Specific Gravity, Urine: 1.014 (ref 1.005–1.030)
pH: 5 (ref 5.0–8.0)

## 2020-04-16 LAB — TROPONIN I (HIGH SENSITIVITY)
Troponin I (High Sensitivity): 66 ng/L — ABNORMAL HIGH (ref <18)
Troponin I (High Sensitivity): 63 ng/L — ABNORMAL HIGH (ref <18)
Troponin I (High Sensitivity): 60 ng/L — ABNORMAL HIGH (ref <18)

## 2020-04-16 LAB — BRAIN NATRIURETIC PEPTIDE: B Natriuretic Peptide: 64.6 pg/mL (ref 0.0–100.0)

## 2020-04-16 LAB — GLUCOSE, CAPILLARY: Glucose-Capillary: 226 mg/dL — ABNORMAL HIGH (ref 70–99)

## 2020-04-16 MED ORDER — INSULIN ASPART 100 UNIT/ML ~~LOC~~ SOLN
0.0000 [IU] | Freq: Three times a day (TID) | SUBCUTANEOUS | Status: DC
  Administered 2020-04-16 – 2020-04-17 (×3): 3 [IU] via SUBCUTANEOUS
  Administered 2020-04-18: 1 [IU] via SUBCUTANEOUS
  Administered 2020-04-18: 2 [IU] via SUBCUTANEOUS
  Filled 2020-04-16 (×5): qty 1

## 2020-04-16 MED ORDER — HYDRALAZINE HCL 20 MG/ML IJ SOLN
5.0000 mg | INTRAMUSCULAR | Status: DC | PRN
  Filled 2020-04-16: qty 0.25

## 2020-04-16 MED ORDER — ATORVASTATIN CALCIUM 20 MG PO TABS
40.0000 mg | ORAL_TABLET | Freq: Every day | ORAL | Status: DC
  Administered 2020-04-16: 40 mg via ORAL
  Filled 2020-04-16: qty 2

## 2020-04-16 MED ORDER — ACETAMINOPHEN 325 MG PO TABS
650.0000 mg | ORAL_TABLET | Freq: Four times a day (QID) | ORAL | Status: DC | PRN
  Administered 2020-04-16: 650 mg via ORAL
  Filled 2020-04-16: qty 2

## 2020-04-16 MED ORDER — AZITHROMYCIN 250 MG PO TABS
250.0000 mg | ORAL_TABLET | Freq: Every day | ORAL | Status: DC
  Administered 2020-04-17 – 2020-04-18 (×2): 250 mg via ORAL
  Filled 2020-04-16 (×2): qty 1

## 2020-04-16 MED ORDER — IPRATROPIUM-ALBUTEROL 0.5-2.5 (3) MG/3ML IN SOLN
3.0000 mL | Freq: Once | RESPIRATORY_TRACT | Status: AC
  Administered 2020-04-16: 3 mL via RESPIRATORY_TRACT
  Filled 2020-04-16: qty 3

## 2020-04-16 MED ORDER — AMLODIPINE BESYLATE 10 MG PO TABS
10.0000 mg | ORAL_TABLET | Freq: Every day | ORAL | Status: DC
  Administered 2020-04-17 – 2020-04-18 (×2): 10 mg via ORAL
  Filled 2020-04-16 (×2): qty 1

## 2020-04-16 MED ORDER — GABAPENTIN 100 MG PO CAPS
100.0000 mg | ORAL_CAPSULE | Freq: Two times a day (BID) | ORAL | Status: DC
  Administered 2020-04-17 – 2020-04-18 (×4): 100 mg via ORAL
  Filled 2020-04-16 (×5): qty 1

## 2020-04-16 MED ORDER — ALBUTEROL SULFATE (2.5 MG/3ML) 0.083% IN NEBU: 2.5000 mg | INHALATION_SOLUTION | RESPIRATORY_TRACT | Status: DC | PRN

## 2020-04-16 MED ORDER — ONDANSETRON HCL 4 MG/2ML IJ SOLN: 4.0000 mg | Freq: Three times a day (TID) | INTRAMUSCULAR | Status: DC | PRN

## 2020-04-16 MED ORDER — FERROUS SULFATE 325 (65 FE) MG PO TABS
325.0000 mg | ORAL_TABLET | ORAL | Status: DC
  Administered 2020-04-17: 325 mg via ORAL
  Filled 2020-04-16 (×2): qty 1

## 2020-04-16 MED ORDER — METHYLPREDNISOLONE SODIUM SUCC 40 MG IJ SOLR
40.0000 mg | Freq: Two times a day (BID) | INTRAMUSCULAR | Status: DC
  Administered 2020-04-17 – 2020-04-18 (×2): 40 mg via INTRAVENOUS
  Filled 2020-04-16 (×2): qty 1

## 2020-04-16 MED ORDER — ENOXAPARIN SODIUM 40 MG/0.4ML ~~LOC~~ SOLN
40.0000 mg | SUBCUTANEOUS | Status: DC
  Administered 2020-04-16 – 2020-04-17 (×2): 40 mg via SUBCUTANEOUS
  Filled 2020-04-16 (×2): qty 0.4

## 2020-04-16 MED ORDER — METHYLPREDNISOLONE SODIUM SUCC 125 MG IJ SOLR
125.0000 mg | Freq: Once | INTRAMUSCULAR | Status: AC
  Administered 2020-04-16: 125 mg via INTRAVENOUS
  Filled 2020-04-16: qty 2

## 2020-04-16 MED ORDER — IPRATROPIUM-ALBUTEROL 0.5-2.5 (3) MG/3ML IN SOLN
3.0000 mL | RESPIRATORY_TRACT | Status: DC
  Administered 2020-04-16 – 2020-04-18 (×8): 3 mL via RESPIRATORY_TRACT
  Filled 2020-04-16 (×9): qty 3

## 2020-04-16 MED ORDER — PANTOPRAZOLE SODIUM 40 MG PO TBEC
40.0000 mg | DELAYED_RELEASE_TABLET | Freq: Every day | ORAL | Status: DC
  Administered 2020-04-17 – 2020-04-18 (×2): 40 mg via ORAL
  Filled 2020-04-16 (×2): qty 1

## 2020-04-16 MED ORDER — TRAZODONE HCL 50 MG PO TABS
50.0000 mg | ORAL_TABLET | Freq: Every day | ORAL | Status: DC
  Administered 2020-04-16 – 2020-04-17 (×2): 50 mg via ORAL
  Filled 2020-04-16 (×2): qty 1

## 2020-04-16 MED ORDER — DM-GUAIFENESIN ER 30-600 MG PO TB12
1.0000 | ORAL_TABLET | Freq: Two times a day (BID) | ORAL | Status: DC | PRN
  Administered 2020-04-16: 1 via ORAL
  Filled 2020-04-16: qty 1

## 2020-04-16 MED ORDER — METOPROLOL SUCCINATE ER 25 MG PO TB24
12.5000 mg | ORAL_TABLET | Freq: Every day | ORAL | Status: DC
  Administered 2020-04-17 – 2020-04-18 (×2): 12.5 mg via ORAL
  Filled 2020-04-16: qty 1
  Filled 2020-04-16: qty 0.5
  Filled 2020-04-16: qty 1

## 2020-04-16 MED ORDER — BUSPIRONE HCL 10 MG PO TABS
10.0000 mg | ORAL_TABLET | Freq: Two times a day (BID) | ORAL | Status: DC
  Administered 2020-04-16 – 2020-04-18 (×4): 10 mg via ORAL
  Filled 2020-04-16 (×3): qty 1
  Filled 2020-04-16: qty 2

## 2020-04-16 MED ORDER — INSULIN ASPART 100 UNIT/ML ~~LOC~~ SOLN
0.0000 [IU] | Freq: Every day | SUBCUTANEOUS | Status: DC
  Administered 2020-04-17: 2 [IU] via SUBCUTANEOUS
  Filled 2020-04-16: qty 1

## 2020-04-16 MED ORDER — SODIUM CHLORIDE 0.9% FLUSH
3.0000 mL | Freq: Once | INTRAVENOUS | Status: AC
  Administered 2020-04-16: 3 mL via INTRAVENOUS

## 2020-04-16 MED ORDER — NICOTINE 21 MG/24HR TD PT24
21.0000 mg | MEDICATED_PATCH | Freq: Every day | TRANSDERMAL | Status: DC
  Administered 2020-04-16 – 2020-04-17 (×2): 21 mg via TRANSDERMAL
  Filled 2020-04-16 (×2): qty 1

## 2020-04-16 MED ORDER — AZITHROMYCIN 500 MG PO TABS
500.0000 mg | ORAL_TABLET | Freq: Every day | ORAL | Status: AC
  Administered 2020-04-16: 500 mg via ORAL
  Filled 2020-04-16: qty 1

## 2020-04-16 NOTE — ED Notes (Signed)
Pt given urinal.

## 2020-04-16 NOTE — ED Notes (Signed)
Pt states he was swabbed for covid yesterday at pcp and was told he was negative- Dr Ellender Hose made aware

## 2020-04-16 NOTE — ED Triage Notes (Signed)
Pt presents to ED via POV with c/o dizziness, SOB. Pt states has had a cold x 2-3 days. Pt states was given OTC medication and thinks he is having a reaction. Pt with noted cough in triage.

## 2020-04-16 NOTE — ED Notes (Signed)
Pt given warm blanket and denture cup

## 2020-04-16 NOTE — ED Notes (Signed)
EMS pt to triage via wheelchair .  behind red line   EMS reports  DX "cold" yesterday , ( - ) covid yesterday  Possible reaction to meds given  Feeling SHOB RR 18- 20 Lungs sounds clear

## 2020-04-16 NOTE — H&P (Signed)
History and Physical    [name] UMP:[id number] DOB: 09-Apr-1941 DOA: 04/16/2020  Referring MD/NP/PA:   PCP: Joanie Coddington, MD   Patient coming from:  The patient is coming from home.  At baseline, pt is independent for most of ADL.        Chief Complaint: Shortness of breath  HPI: [name] is a 79 y.o. male with medical history significant of hypertension, hyperlipidemia, diabetes mellitus, COPD, TIA, GERD, CAD, non-STEMI, tobacco abuse, iron deficiency anemia, who presents with shortness of breath.  Patient was recently hospitalized from 7/4-7/6 due to chest pain and non-STEMI. Patient states that in the past several days, he has been having shortness of breath and cough. Symptoms have been progressively worsening. Patient states that his cough is mostly dry cough, but also with little yellow-colored sputum production. No fever or chills. Denies chest pain. Patient has nausea, no vomiting, diarrhea or abdominal pain. No symptoms of UTI. Patient states that he has mild dizziness. No unilateral numbness or tingling in his extremities with no facial droop or slurred speech.  ED Course: pt was found to have WBC 6.9, troponin 66, pending COVID-19 PCR, electrolytes renal function okay, temperature normal, blood pressure 113/67, heart rate 100, RR 24, oxygen saturation 100% on room air. Chest x-ray negative. Patient is placed on MedSurg bed for observation  Review of Systems:   General: no fevers, chills, no body weight gain, as fatigue HEENT: no blurry vision, hearing changes or sore throat Respiratory: has dyspnea, coughing, wheezing CV: no chest pain, no palpitations GI: no nausea, vomiting, abdominal pain, diarrhea, constipation GU: no dysuria, burning on urination, increased urinary frequency, hematuria  Ext: no leg edema Neuro: no unilateral weakness, numbness, or tingling, no vision change or hearing loss Skin: no rash, no skin tear. MSK: No muscle spasm, no deformity, no  limitation of range of movement in spin Heme: No easy bruising.  Travel history: No recent long distant travel.  Allergy:  Allergies  Allergen Reactions  . Lisinopril Swelling    Lip and check swelling  . Aspirin     Held due to anemia and fall risk.  . Codeine   . Ivp Dye [Iodinated Diagnostic Agents] Hives and Other (See Comments)    dizzy  . Percocet [Oxycodone-Acetaminophen] Hives and Nausea Only    Past Medical History:  Diagnosis Date  . Atypical chest pain    a. 05/2011 Neg MV; b. 04/2013 Neg MV; c. 04/2019 Echo: EF 55-60%, mild BAE. Degen MV dzs. Nl RV fxn; d. 06/2019 MV: EF 55%, small, mild, fixed apical and apical ant defect - probable artifact. No ischemia.  Marland Kitchen COPD (chronic obstructive pulmonary disease) ([place])   . Coronary artery calcification seen on CT scan    a. 07/2018 CT Chest - cor Ca2+.  . Diabetes mellitus without complication ([place])   . Erosive gastropathy    a. 06/2019 EGD  . GERD (gastroesophageal reflux disease)   . Hypertension   . Pulmonary nodules    a. 07/2018 Chest CT: RML and RLL nodules.  Marland Kitchen TIA (transient ischemic attack)     No past surgical history on file.  Social History:  reports that he has been smoking. He has a 30.00 pack-year smoking history. He has never used smokeless tobacco. He reports current alcohol use. He reports that he does not use drugs.  Family History:  Family History  Problem Relation Age of Onset  . Diabetes Mellitus II Mother   . Diabetes Mellitus II Father   .  Diabetes Mellitus II Sister   . Diabetes Mellitus II Brother      Prior to Admission medications   Medication Sig Start Date End Date Taking? Authorizing Provider  Acetaminophen 500 MG capsule Take by mouth.    [provider]  albuterol (PROAIR HFA) 108 (90 Base) MCG/ACT inhaler Inhale 2 puffs every 6 (six) hours as needed into the lungs. 12/07/15   [provider]  amLODipine (NORVASC) 10 MG tablet Take 1 tablet (10 mg total) by mouth  daily. 05/30/16   Hower, Aaron Mose, MD  atorvastatin (LIPITOR) 40 MG tablet Take 40 mg by mouth daily. 01/21/20   [provider]  budesonide-formoterol (SYMBICORT) 160-4.5 MCG/ACT inhaler Inhale 2 puffs into the lungs 2 (two) times daily.    [provider]  ferrous sulfate 325 (65 FE) MG tablet Take 325 mg by mouth every other day. 02/11/20   [provider]  gabapentin (NEURONTIN) 100 MG capsule Take 100 mg by mouth 2 (two) times daily.  08/06/19   [provider]  MELATONIN PO Take 5 mg by mouth at bedtime as needed. Patient not taking: Reported on 03/22/2020    [provider]  metFORMIN (GLUCOPHAGE) 850 MG tablet Take 850 mg by mouth daily with breakfast.     [provider]  metoprolol succinate (TOPROL-XL) 25 MG 24 hr tablet Take 12.5 mg by mouth daily. 08/02/19   [provider]  ondansetron (ZOFRAN ODT) 4 MG disintegrating tablet Take 1 tablet (4 mg total) by mouth every 8 (eight) hours as needed for nausea or vomiting. 01/07/20   Carrie Mew, MD  pantoprazole (PROTONIX) 40 MG tablet Take 40 mg by mouth daily. 01/15/20   [provider]  traZODone (DESYREL) 50 MG tablet Take 1 tablet (50 mg total) by mouth at bedtime. 09/02/19   Gregor Hams, MD    Physical Exam: Vitals:   04/16/20 1100 04/16/20 1101  BP: 113/67   Pulse: 100   Resp: (!) 24   Temp: 98.2 F (36.8 C)   TempSrc: Oral   SpO2: 100%   Weight:  63.5 kg  Height:  5\' 7"  (1.702 m)   General: Not in acute distress HEENT:       Eyes: PERRL, EOMI, no scleral icterus.       ENT: No discharge from the ears and nose, no pharynx injection, no tonsillar enlargement.        Neck: No JVD, no bruit, no mass felt. Heme: No neck lymph node enlargement. Cardiac: S1/S2, RRR, No murmurs, No gallops or rubs. Respiratory: Has wheezing bilaterally GI: Soft, nondistended, nontender, no rebound pain, no organomegaly, BS present. GU: No hematuria Ext: No pitting  leg edema bilaterally. 2+DP/PT pulse bilaterally. Musculoskeletal: No joint deformities, No joint redness or warmth, no limitation of ROM in spin. Skin: No rashes.  Neuro: Alert, oriented X3, cranial nerves II-XII grossly intact, moves all extremities normally.  Psych: Patient is not psychotic, no suicidal or hemocidal ideation.  Labs on Admission: I have personally reviewed following labs and imaging studies  CBC: Recent Labs  Lab 04/16/20 1116  WBC 6.9  HGB 11.6*  HCT 36.5*  MCV 76.5*  PLT 035   Basic Metabolic Panel: Recent Labs  Lab 04/16/20 1116  NA 132*  K 3.6  CL 94*  CO2 27  GLUCOSE 245*  BUN 22  CREATININE 1.08  CALCIUM 9.1   GFR: Estimated Creatinine Clearance: 51.4 mL/min (by C-G formula based on SCr of 1.08 mg/dL). Liver  Function Tests: No results for input(s): AST, ALT, ALKPHOS, BILITOT, PROT, ALBUMIN in the last 168 hours. No results for input(s): LIPASE, AMYLASE in the last 168 hours. No results for input(s): AMMONIA in the last 168 hours. Coagulation Profile: No results for input(s): INR, PROTIME in the last 168 hours. Cardiac Enzymes: No results for input(s): CKTOTAL, CKMB, CKMBINDEX, TROPONINI in the last 168 hours. BNP (last 3 results) No results for input(s): PROBNP in the last 8760 hours. HbA1C: No results for input(s): HGBA1C in the last 72 hours. CBG: No results for input(s): GLUCAP in the last 168 hours. Lipid Profile: No results for input(s): CHOL, HDL, LDLCALC, TRIG, CHOLHDL, LDLDIRECT in the last 72 hours. Thyroid Function Tests: No results for input(s): TSH, T4TOTAL, FREET4, T3FREE, THYROIDAB in the last 72 hours. Anemia Panel: No results for input(s): VITAMINB12, FOLATE, FERRITIN, TIBC, IRON, RETICCTPCT in the last 72 hours. Urine analysis:    Component Value Date/Time   COLORURINE YELLOW (A) 01/06/2020 2251   APPEARANCEUR CLEAR (A) 01/06/2020 2251   APPEARANCEUR Clear 10/16/2013 0435   LABSPEC 1.020 01/06/2020 2251   LABSPEC  1.015 10/16/2013 0435   PHURINE 6.0 01/06/2020 2251   GLUCOSEU NEGATIVE 01/06/2020 2251   GLUCOSEU Negative 10/16/2013 0435   HGBUR NEGATIVE 01/06/2020 2251   BILIRUBINUR NEGATIVE 01/06/2020 2251   BILIRUBINUR Negative 10/16/2013 [place] 01/06/2020 2251   PROTEINUR NEGATIVE 01/06/2020 2251   NITRITE NEGATIVE 01/06/2020 2251   LEUKOCYTESUR NEGATIVE 01/06/2020 2251   LEUKOCYTESUR Negative 10/16/2013 0435   Sepsis Labs: @LABRCNTIP (procalcitonin:4,lacticidven:4) )No results found for this or any previous visit (from the past 240 hour(s)).   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 No confluent airspace opacities or effusions. Heart is normal size. No acute bony abnormality. IMPRESSION: No active cardiopulmonary disease. Electronically Signed   By: Rolm Baptise M.D.   On: 04/16/2020 11:35     EKG: Independently reviewed. Sinus rhythm, occasional PAC and PVC, QTC 481, anteroseptal infarction pattern  Assessment/Plan Principal Problem:   COPD exacerbation (HCC) Active Problems:   Diabetes mellitus without complication (HCC)   GERD (gastroesophageal reflux disease)   Hypertension   TIA (transient ischemic attack)   Elevated troponin   CAD (coronary artery disease)   HLD (hyperlipidemia)   Tobacco abuse   Dizziness   Iron deficiency anemia  COPD exacerbation ([place]): Patient has productive cough, shortness of breath and wheezing on auscultation, with negative chest x-ray. Clinically consistent with COPD exacerbation.  - will place to progressive unit for observation -Bronchodilators -Solu-Medrol 40 mg IV bid -Z pak  -Mucinex for cough  -Incentive spirometry -Follow up  sputum culture -Nasal cannula oxygen as needed to maintain O2 saturation 93% or  greater  Diabetes mellitus without complication ([place]): Most recent A1c 6.2, well controled. Patient is taking Metformin at home -SSI  GERD (gastroesophageal reflux disease) -Protonix  Hypertension  -IV hydralazine as needed -Amlodipine, metoprolol  TIA (transient ischemic attack) -Lipitor  Elevated troponin and CAD Troponin 66. Patient was recently hospitalized due to non-STEMI from 7/4-03/24/20. He had troponin elevation 52-116. Today patient does not have any chest pain. -Trend troponin -Patient had A1c 6.2 on 03/22/2020 and LDL 30 on 03/23/2020. -Repeat EKG in morning -Patient is allergic to aspirin -Continue Lipitor  HLD (hyperlipidemia) -Lipitor  Tobacco abuse: -Nicotine patch -Did counseling  about importance of quitting smoking  Iron deficiency anemia: Hemoglobin 11.6, stable -Continue iron supplement  Dizziness: Patient has mild dizziness. No focal neuro deficit on physical examination. Low suspicions of for stroke. -PT/OT     DVT ppx: SQ Lovenox Code Status: Full code Family Communication:   Yes, patient's son bh phone Disposition Plan:  Anticipate discharge back to previous environment Consults called:  none Admission status: Med-surg bed for obs    Status is: Observation  The patient remains OBS appropriate and will d/c before 2 midnights.  Dispo: The patient is from: Home              Anticipated d/c is to: Home              Anticipated d/c date is: 1 day              Patient currently is not medically stable to d/c.     You are to give you some. Was going and so came to my judgment in because of her shortness breath cough and diarrhea. Tegeler is a is he has a lot of wheezing a continue to smoke is a bronchitis has worsened so I will admit him to the hospital I will treat him with a breathing treatment giving him few days of antibiotics and a steroid and hopefully he will get better and that and that he told me if something happened to him in the  hospital extremely suddenly unexpectedly able to give [place] a permission to revive him or resuscitated in the brain okay throughout and presently okay I does not give you a list. I'm very glad to take care of him. He has          Date of Service 04/16/2020    [facility]   If 7PM-7AM, please contact night-coverage [email or web address] 04/16/2020, 4:23 PM

## 2020-04-16 NOTE — ED Provider Notes (Signed)
[facility] Emergency Department Provider Note  ____________________________________________   First MD Initiated Contact with Patient 04/16/20 1302     (approximate)  I have reviewed the triage vital signs and the nursing notes.   HISTORY  Chief Complaint Shortness of Breath    HPI [name] is a 79 y.o. male  Here with cough, SOB. Pt reports that his symptoms started approx 3-4 days ago with nasal congestion and dry cough. He thought he had a cold. No fever. He went to his PCP and was given Mucinex, which he started taking yesterday. He feels like over the past 24 hours, his cough has worsened and he has now developed worsening wheezing and SOB. He feels weak and lightheaded w/ exertion. He's had some mild CP as well. He denies any known sick contacts. His cough is mostly dry but occasionally productive of yellow-green sputum. Has not been on any ABX or steroids recently.        Past Medical History:  Diagnosis Date  . Atypical chest pain    a. 05/2011 Neg MV; b. 04/2013 Neg MV; c. 04/2019 Echo: EF 55-60%, mild BAE. Degen MV dzs. Nl RV fxn; d. 06/2019 MV: EF 55%, small, mild, fixed apical and apical ant defect - probable artifact. No ischemia.  Marland Kitchen COPD (chronic obstructive pulmonary disease) ([place])   . Coronary artery calcification seen on CT scan    a. 07/2018 CT Chest - cor Ca2+.  . Diabetes mellitus without complication ([place])   . Erosive gastropathy    a. 06/2019 EGD  . GERD (gastroesophageal reflux disease)   . Hypertension   . Pulmonary nodules    a. 07/2018 Chest CT: RML and RLL nodules.  Marland Kitchen TIA (transient ischemic attack)     Patient Active Problem List   Diagnosis Date Noted  . Tobacco abuse 03/24/2020  . NSTEMI (non-ST elevated myocardial infarction) ([place]) 03/23/2020  . Chest pain 03/22/2020  . HLD (hyperlipidemia) 03/22/2020  . COPD (chronic obstructive pulmonary disease) ([place])   . Diabetes mellitus without complication ([place])   . GERD  (gastroesophageal reflux disease)   . Hypertension   . TIA (transient ischemic attack)   . Elevated troponin   . CAD (coronary artery disease)   . Adjustment reaction with anxiety and depression 09/02/2019  . Chest pain with high risk of acute coronary syndrome 08/29/2019  . Near syncope 05/28/2016  . COPD exacerbation ([place]) 05/27/2016    No past surgical history on file.  Prior to Admission medications   Medication Sig Start Date End Date Taking? Authorizing Provider  Acetaminophen 500 MG capsule Take by mouth.    [provider]  albuterol (PROAIR HFA) 108 (90 Base) MCG/ACT inhaler Inhale 2 puffs every 6 (six) hours as needed into the lungs. 12/07/15   [provider]  amLODipine (NORVASC) 10 MG tablet Take 1 tablet (10 mg total) by mouth daily. 05/30/16   Hower, Aaron Mose, MD  atorvastatin (LIPITOR) 40 MG tablet Take 40 mg by mouth daily. 01/21/20   [provider]  budesonide-formoterol (SYMBICORT) 160-4.5 MCG/ACT inhaler Inhale 2 puffs into the lungs 2 (two) times daily.    [provider]  ferrous sulfate 325 (65 FE) MG tablet Take 325 mg by mouth every other day. 02/11/20   [provider]  gabapentin (NEURONTIN) 100 MG capsule Take 100 mg by mouth 2 (two) times daily.  08/06/19   [provider]  MELATONIN PO Take 5 mg by mouth at bedtime as  needed. Patient not taking: Reported on 03/22/2020    [provider]  metFORMIN (GLUCOPHAGE) 850 MG tablet Take 850 mg by mouth daily with breakfast.     [provider]  metoprolol succinate (TOPROL-XL) 25 MG 24 hr tablet Take 12.5 mg by mouth daily. 08/02/19   [provider]  ondansetron (ZOFRAN ODT) 4 MG disintegrating tablet Take 1 tablet (4 mg total) by mouth every 8 (eight) hours as needed for nausea or vomiting. 01/07/20   Carrie Mew, MD  pantoprazole (PROTONIX) 40 MG tablet Take 40 mg by mouth daily. 01/15/20   [provider]  traZODone (DESYREL)  50 MG tablet Take 1 tablet (50 mg total) by mouth at bedtime. 09/02/19   Gregor Hams, MD    Allergies Lisinopril, Aspirin, Codeine, Ivp dye [iodinated diagnostic agents], and Percocet [oxycodone-acetaminophen]  Family History  Problem Relation Age of Onset  . Diabetes Mellitus II Mother   . Diabetes Mellitus II Father   . Diabetes Mellitus II Sister   . Diabetes Mellitus II Brother     Social History Social History   Tobacco Use  . Smoking status: Current Every Day Smoker    Packs/day: 0.50    Years: 60.00    Pack years: 30.00  . Smokeless tobacco: Never Used  . Tobacco comment: still smoking 1/2 ppd.  Substance Use Topics  . Alcohol use: Yes  . Drug use: No    Review of Systems  Review of Systems  Constitutional: Positive for fatigue. Negative for chills and fever.  HENT: Negative for sore throat.   Respiratory: Positive for cough, shortness of breath and wheezing.   Cardiovascular: Negative for chest pain.  Gastrointestinal: Negative for abdominal pain.  Genitourinary: Negative for flank pain.  Musculoskeletal: Negative for neck pain.  Skin: Negative for rash and wound.  Allergic/Immunologic: Negative for immunocompromised state.  Neurological: Positive for weakness. Negative for numbness.  Hematological: Does not bruise/bleed easily.  All other systems reviewed and are negative.    ____________________________________________  PHYSICAL EXAM:      VITAL SIGNS: ED Triage Vitals  Enc Vitals Group     BP 04/16/20 1100 113/67     Pulse Rate 04/16/20 1100 100     Resp 04/16/20 1100 (!) 24     Temp 04/16/20 1100 98.2 F (36.8 C)     Temp Source 04/16/20 1100 Oral     SpO2 04/16/20 1100 100 %     Weight 04/16/20 1101 140 lb (63.5 kg)     Height 04/16/20 1101 5\' 7"  (1.702 m)     Head Circumference --      Peak Flow --      Pain Score 04/16/20 1106 0     Pain Loc --      Pain Edu? --      Excl. in [place]? --      Physical Exam Vitals and nursing note  reviewed.  Constitutional:      General: He is not in acute distress.    Appearance: He is well-developed.  HENT:     Head: Normocephalic and atraumatic.  Eyes:     Conjunctiva/sclera: Conjunctivae normal.  Cardiovascular:     Rate and Rhythm: Regular rhythm. Tachycardia present.     Heart sounds: Normal heart sounds. No murmur heard.  No friction rub.  Pulmonary:     Effort: Tachypnea and respiratory distress present.     Breath sounds: Examination of the right-upper field reveals decreased breath sounds and wheezing. Examination  of the left-upper field reveals decreased breath sounds and wheezing. Examination of the right-middle field reveals decreased breath sounds and wheezing. Examination of the left-middle field reveals decreased breath sounds and wheezing. Examination of the right-lower field reveals decreased breath sounds and wheezing. Examination of the left-lower field reveals decreased breath sounds and wheezing. Decreased breath sounds and wheezing present. No rales.  Abdominal:     General: There is no distension.     Palpations: Abdomen is soft.     Tenderness: There is no abdominal tenderness.  Musculoskeletal:     Cervical back: Neck supple.  Skin:    General: Skin is warm.     Capillary Refill: Capillary refill takes less than 2 seconds.  Neurological:     Mental Status: He is alert and oriented to person, place, and time.     Motor: No abnormal muscle tone.       ____________________________________________   LABS (all labs ordered are listed, but only abnormal results are displayed)  Labs Reviewed  BASIC METABOLIC PANEL - Abnormal; Notable for the following components:      Result Value   Sodium 132 (*)    Chloride 94 (*)    Glucose, Bld 245 (*)    All other components within normal limits  CBC - Abnormal; Notable for the following components:   Hemoglobin 11.6 (*)    HCT 36.5 (*)    MCV 76.5 (*)    MCH 24.3 (*)    RDW 18.6 (*)    All other  components within normal limits  URINALYSIS, COMPLETE (UACMP) WITH MICROSCOPIC  CBG MONITORING, ED  TROPONIN I (HIGH SENSITIVITY)    ____________________________________________  EKG: Normal sinus rhyhm, VR 98. PR 176, QRS 80, QTc 487. No acute ST elevations. 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 TWI in V6. No STEMI. ________________________________________  RADIOLOGY All imaging, including plain films, CT scans, and ultrasounds, independently reviewed by me, and interpretations confirmed via formal radiology reads.  ED MD interpretation:   CXR: Clear, no focal PNA  Official radiology report(s): DG Chest 2 View  Result Date: 04/16/2020 CLINICAL DATA:  Shortness of breath, dizziness EXAM: CHEST - 2 VIEW COMPARISON:  03/22/2020 FINDINGS: Posterior left diaphragmatic hernia again noted as seen on prior imaging. No confluent airspace opacities or effusions. Heart is normal size. No acute bony abnormality. IMPRESSION: No active cardiopulmonary disease. Electronically Signed   By: Rolm Baptise M.D.   On: 04/16/2020 11:35    ____________________________________________  PROCEDURES   Procedure(s) performed (including Critical Care):  .1-3 Lead EKG Interpretation Performed by: Duffy Bruce, MD Authorized by: Duffy Bruce, MD     Interpretation: abnormal     ECG rate:  100-110   ECG rate assessment: tachycardic     Rhythm: sinus rhythm     Ectopy: none     Conduction: normal   Comments:     Indication: SOB, hypoxia    ____________________________________________  INITIAL IMPRESSION / MDM / ASSESSMENT AND PLAN / ED COURSE  As part of my medical decision making, I reviewed the following data within the [facility] notes reviewed and incorporated, Old chart reviewed, Notes from prior ED visits, and [place] Controlled Substance Database       *Vyncent Brisco was evaluated in Emergency Department on 04/16/2020 for the symptoms described in the history of present  illness. He was evaluated in the context of the global COVID-19 pandemic, which necessitated consideration that the patient might be at risk for infection with the SARS-CoV-2  virus that causes COVID-19. Institutional protocols and algorithms that pertain to the evaluation of patients at risk for COVID-19 are in a state of rapid change based on information released by regulatory bodies including the CDC and federal and state organizations. These policies and algorithms were followed during the patient's care in the ED.  Some ED evaluations and interventions may be delayed as a result of limited staffing during the pandemic.*     Medical Decision Making:  79 yo M here with SOB, cough, wheezing. Suspect ongoing URI now with COPD exacerbation. He demonstrates significant increased WOB on arrival with diffuse wheezing. CXR is clear. EKG is non-ischemic. No s/s to suggest DVT or PE. Pt given steroids, neb x 3 with improvement but persistent SOB. Will admit for COPD exacerbation. Lytes, labs o/w reviewed and unremarkable. COVID obtained yesterday is negative and he's had no exposures or fevers.  ____________________________________________  FINAL CLINICAL IMPRESSION(S) / ED DIAGNOSES  Final diagnoses:  COPD exacerbation ([place])     MEDICATIONS GIVEN DURING THIS VISIT:  Medications  sodium chloride flush (NS) 0.9 % injection 3 mL (3 mLs Intravenous Given 04/16/20 1407)  methylPREDNISolone sodium succinate (SOLU-MEDROL) 125 mg/2 mL injection 125 mg (125 mg Intravenous Given 04/16/20 1405)  ipratropium-albuterol (DUONEB) 0.5-2.5 (3) MG/3ML nebulizer solution 3 mL (3 mLs Nebulization Given 04/16/20 1343)  ipratropium-albuterol (DUONEB) 0.5-2.5 (3) MG/3ML nebulizer solution 3 mL (3 mLs Nebulization Given 04/16/20 1342)  ipratropium-albuterol (DUONEB) 0.5-2.5 (3) MG/3ML nebulizer solution 3 mL (3 mLs Nebulization Given 04/16/20 1342)     ED Discharge Orders    None       Note:  This document was prepared  using Dragon voice recognition software and may include unintentional dictation errors.   Duffy Bruce, MD 04/16/20 [phone]

## 2020-04-17 DIAGNOSIS — J441 Chronic obstructive pulmonary disease with (acute) exacerbation: Secondary | ICD-10-CM

## 2020-04-17 DIAGNOSIS — I10 Essential (primary) hypertension: Secondary | ICD-10-CM | POA: Diagnosis not present

## 2020-04-17 DIAGNOSIS — E119 Type 2 diabetes mellitus without complications: Secondary | ICD-10-CM | POA: Diagnosis not present

## 2020-04-17 LAB — GLUCOSE, CAPILLARY
Glucose-Capillary: 136 mg/dL — ABNORMAL HIGH (ref 70–99)
Glucose-Capillary: 151 mg/dL — ABNORMAL HIGH (ref 70–99)
Glucose-Capillary: 221 mg/dL — ABNORMAL HIGH (ref 70–99)
Glucose-Capillary: 227 mg/dL — ABNORMAL HIGH (ref 70–99)
Glucose-Capillary: 230 mg/dL — ABNORMAL HIGH (ref 70–99)
Glucose-Capillary: 64 mg/dL — ABNORMAL LOW (ref 70–99)

## 2020-04-17 LAB — CBC
HCT: 35 % — ABNORMAL LOW (ref 39.0–52.0)
Hemoglobin: 11.4 g/dL — ABNORMAL LOW (ref 13.0–17.0)
MCH: 24.6 pg — ABNORMAL LOW (ref 26.0–34.0)
MCHC: 32.6 g/dL (ref 30.0–36.0)
MCV: 75.4 fL — ABNORMAL LOW (ref 80.0–100.0)
Platelets: 174 10*3/uL (ref 150–400)
RBC: 4.64 MIL/uL (ref 4.22–5.81)
RDW: 18.4 % — ABNORMAL HIGH (ref 11.5–15.5)
WBC: 6.7 10*3/uL (ref 4.0–10.5)
nRBC: 0 % (ref 0.0–0.2)

## 2020-04-17 LAB — BASIC METABOLIC PANEL
Anion gap: 9 (ref 5–15)
BUN: 25 mg/dL — ABNORMAL HIGH (ref 8–23)
CO2: 26 mmol/L (ref 22–32)
Calcium: 8.8 mg/dL — ABNORMAL LOW (ref 8.9–10.3)
Chloride: 99 mmol/L (ref 98–111)
Creatinine, Ser: 0.97 mg/dL (ref 0.61–1.24)
GFR calc Af Amer: >60 mL/min (ref >60)
GFR calc non Af Amer: >60 mL/min (ref >60)
Glucose, Bld: 221 mg/dL — ABNORMAL HIGH (ref 70–99)
Potassium: 3.7 mmol/L (ref 3.5–5.1)
Sodium: 134 mmol/L — ABNORMAL LOW (ref 135–145)

## 2020-04-17 LAB — TROPONIN I (HIGH SENSITIVITY): Troponin I (High Sensitivity): 65 ng/L — ABNORMAL HIGH (ref <18)

## 2020-04-17 MED ORDER — ATORVASTATIN CALCIUM 20 MG PO TABS
40.0000 mg | ORAL_TABLET | Freq: Every day | ORAL | Status: DC
  Administered 2020-04-17: 40 mg via ORAL
  Filled 2020-04-17: qty 2

## 2020-04-17 NOTE — Progress Notes (Signed)
PROGRESS NOTE    [name]  RFF:[id number] DOB: March 28, 1941 DOA: 04/16/2020 PCP: Joanie Coddington, MD    Assessment & Plan:   Principal Problem:   COPD exacerbation ([place]) Active Problems:   Diabetes mellitus without complication ([place])   GERD (gastroesophageal reflux disease)   Hypertension   TIA (transient ischemic attack)   Elevated troponin   CAD (coronary artery disease)   HLD (hyperlipidemia)   Tobacco abuse   Dizziness   Iron deficiency anemia  COPD exacerbation: still w/ dyspnea today. Continue on bronchodilators, IV azithromycin for its anti-inflammatory properties, IV steroids and incentive spirometry. Mucinex prn for cough   DM2: HbA1c 6.2, well controled. Hold home dose of metformin. Continue on SSI w/ accuchecks. Carb modified diet   GERD: continue on PPI   Hypertension: continue on amlodipine, metoprolol. IV hydralazine prn   TIA: continue on statin   CAD: w/ elevated troponins. Recently hospitalized due to non-STEMI from 7/4-03/24/20. He had troponin elevation 52-116. No chest pain. Not taking aspirin as pt is allergic  HLD: continue on liptor   Tobacco abuse: nicotine patch to prevent w/drawal. Smoking cessation counseling   Iron deficiency anemia: continue on iron supplement   Dizziness: no focal neuro deficit. PT/OT consulted   DVT prophylaxis: lovenox Code Status: full  Family Communication: Disposition Plan: depends on PT/OT recs   Consultants:      Procedures:    Antimicrobials: azithromycin   Subjective: Pt c/o shortness of breath   Objective: Vitals:   04/16/20 1937 04/16/20 2324 04/17/20 0218 04/17/20 0439  BP: 128/78   (!) 143/77  Pulse: 95   75  Resp: 18  22 20   Temp:  98.4 F (36.9 C) 97.9 F (36.6 C) 98.1 F (36.7 C)  TempSrc:  Oral Oral Oral  SpO2: 100%   99%  Weight:      Height:        Intake/Output Summary (Last 24 hours) at 04/17/2020 0752 Last data filed at 04/16/2020 2330 Gross per 24 hour  Intake 240  ml  Output 325 ml  Net -85 ml   Filed Weights   04/16/20 1101  Weight: 63.5 kg    Examination:  General exam: Appears calm and comfortable  Respiratory system: course breath sounds b/l Cardiovascular system: S1 & S2+. No  rubs, gallops or clicks.  Gastrointestinal system: Abdomen is nondistended, soft and nontender.  Normal bowel sounds heard. Central nervous system: Alert and oriented. Moves all 4 extremities Psychiatry: Judgement and insight appear normal. Mood & affect appropriate.     Data Reviewed: I have personally reviewed following labs and imaging studies  CBC: Recent Labs  Lab 04/16/20 1116 04/17/20 0518  WBC 6.9 6.7  HGB 11.6* 11.4*  HCT 36.5* 35.0*  MCV 76.5* 75.4*  PLT 185 357   Basic Metabolic Panel: Recent Labs  Lab 04/16/20 1116 04/17/20 0518  NA 132* 134*  K 3.6 3.7  CL 94* 99  CO2 27 26  GLUCOSE 245* 221*  BUN 22 25*  CREATININE 1.08 0.97  CALCIUM 9.1 8.8*   GFR: Estimated Creatinine Clearance: 57.3 mL/min (by C-G formula based on SCr of 0.97 mg/dL). Liver Function Tests: No results for input(s): AST, ALT, ALKPHOS, BILITOT, PROT, ALBUMIN in the last 168 hours. No results for input(s): LIPASE, AMYLASE in the last 168 hours. No results for input(s): AMMONIA in the last 168 hours. Coagulation Profile: No results for input(s): INR, PROTIME in the last 168 hours. Cardiac Enzymes: No results for input(s): CKTOTAL, CKMB, CKMBINDEX,  TROPONINI in the last 168 hours. BNP (last 3 results) No results for input(s): PROBNP in the last 8760 hours. HbA1C: No results for input(s): HGBA1C in the last 72 hours. CBG: Recent Labs  Lab 04/16/20 1655 04/16/20 2343  GLUCAP 226* 230*   Lipid Profile: No results for input(s): CHOL, HDL, LDLCALC, TRIG, CHOLHDL, LDLDIRECT in the last 72 hours. Thyroid Function Tests: No results for input(s): TSH, T4TOTAL, FREET4, T3FREE, THYROIDAB in the last 72 hours. Anemia Panel: No results for input(s): VITAMINB12,  FOLATE, FERRITIN, TIBC, IRON, RETICCTPCT in the last 72 hours. Sepsis Labs: No results for input(s): PROCALCITON, LATICACIDVEN in the last 168 hours.  No results found for this or any previous visit (from the past 240 hour(s)).       Radiology Studies: DG Chest 2 View  Result Date: 04/16/2020 CLINICAL DATA:  Shortness of breath, dizziness EXAM: CHEST - 2 VIEW COMPARISON:  03/22/2020 FINDINGS: Posterior left diaphragmatic hernia again noted as seen on prior imaging. No confluent airspace opacities or effusions. Heart is normal size. No acute bony abnormality. IMPRESSION: No active cardiopulmonary disease. Electronically Signed   By: Rolm Baptise M.D.   On: 04/16/2020 11:35        Scheduled Meds: . amLODipine  10 mg Oral Daily  . atorvastatin  40 mg Oral Daily  . azithromycin  250 mg Oral Daily  . busPIRone  10 mg Oral BID  . enoxaparin (LOVENOX) injection  40 mg Subcutaneous Q24H  . ferrous sulfate  325 mg Oral QODAY  . gabapentin  100 mg Oral BID  . insulin aspart  0-5 Units Subcutaneous QHS  . insulin aspart  0-9 Units Subcutaneous TID WC  . ipratropium-albuterol  3 mL Nebulization Q4H  . methylPREDNISolone (SOLU-MEDROL) injection  40 mg Intravenous Q12H  . metoprolol succinate  12.5 mg Oral Daily  . nicotine  21 mg Transdermal Daily  . pantoprazole  40 mg Oral Daily  . traZODone  50 mg Oral QHS   Continuous Infusions:   LOS: 0 days    Time spent: 33 mins     Wyvonnia Dusky, MD [facility] Pager [phone]  If 7PM-7AM, please contact night-coverage [email or web address] 04/17/2020, 7:52 AM

## 2020-04-17 NOTE — Progress Notes (Signed)
Ch visited with Pt as part of routine rounding. Pt said that he would really want some prayer. Ch prayed with Pt, and Pt was grateful for visit and prayer.

## 2020-04-17 NOTE — ED Notes (Signed)
Report given to 1A RN 

## 2020-04-17 NOTE — Progress Notes (Signed)
Inpatient Diabetes Program Recommendations  AACE/ADA: New Consensus Statement on Inpatient Glycemic Control (2015)  Target Ranges:  Prepandial:   less than 140 mg/dL      Peak postprandial:   less than 180 mg/dL (1-2 hours)      Critically ill patients:  140 - 180 mg/dL   Results for OAK, DOREY (MRN [id number]) as of 04/17/2020 09:41  Ref. Range 04/16/2020 16:55 04/16/2020 23:43 04/17/2020 08:33  Glucose-Capillary Latest Ref Range: 70 - 99 mg/dL  125 mg Solumedrol given at 2pm 226 (H)  3 units NOVOLOG  230 (H)  2 units NOVOLOG  227 (H)   Results for CHISUM, HABENICHT (MRN [id number]) as of 04/17/2020 09:41  Ref. Range 03/22/2020 04:45  Hemoglobin A1C Latest Ref Range: 4.8 - 5.6 % 6.2 (H)    Admit with: SOB/ COPD  History: DM  Home DM Meds: Metformin 850 mg Daily  Current Orders: Novolog Sensitive Correction Scale/ SSI (0-9 units) TID AC + HS     MD- Note Patient received 125 mg Solumedrol yesterday at 2pm and now getting Solumedrol 40 mg BID.  CBG 227 this AM.  Please consider adding low dose basal insulin to inpatient insulin regimen while patient getting steroids:  Lantus 6 units daily to start (0.1 units/kg)     --Will follow patient during hospitalization--  Wyn Quaker RN, MSN, CDE Diabetes Coordinator Inpatient Glycemic Control Team Team Pager: [phone] (8a-5p)

## 2020-04-17 NOTE — Evaluation (Signed)
Occupational Therapy Evaluation Patient Details Name: [name] MRN: [id number] DOB: 06/23/1941 Today's Date: 04/17/2020    History of Present Illness is a 79 y.o. male with medical history significant of hypertension, hyperlipidemia, diabetes mellitus, COPD, TIA, GERD, CAD, non-STEMI, tobacco abuse, iron deficiency anemia, who presents with shortness of breath. Patient was recently hospitalized from 7/4-7/6 due to chest pain and non-STEMI.   Clinical Impression   Pt seen for OT evaluation this date. Pt was independent in most ADL (receiving assist for showers and assist for IADL including meals and cleaning), using rollator for mobility. Pt lives in a senior citizen apartment community. Pt reports becoming easily fatigued or out of breath with minimal exertion. Pt currently requires CGA for LB ADL and ADL mobility due to current functional impairments (See OT Problem List below). Pt educated in energy conservation strategies including activity pacing, home/routines modifications, and falls prevention. Handout provided. Pt verbalized understanding and would benefit from additional skilled OT services to maximize recall and carryover of learned techniques and facilitate implementation of learned techniques into daily routines. Upon discharge, recommend [place] services.      Follow Up Recommendations  Home health OT    Equipment Recommendations  None recommended by OT    Recommendations for Other Services       Precautions / Restrictions Precautions Precautions: Fall Restrictions Weight Bearing Restrictions: No      Mobility Bed Mobility Overal bed mobility: Modified Independent                Transfers Overall transfer level: Needs assistance Equipment used: Rolling walker (2 wheeled) Transfers: Sit to/from Stand Sit to Stand: Supervision         General transfer comment: no difficulty    Balance Overall balance assessment: Mild deficits observed, not formally  tested                                         ADL either performed or assessed with clinical judgement   ADL Overall ADL's : Needs assistance/impaired                                       General ADL Comments: supervision for LB ADL, CGA for ADL transfers     Vision Patient Visual Report: No change from baseline Additional Comments: 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     Perception     Praxis      Pertinent Vitals/Pain Pain Assessment: No/denies pain     Hand Dominance     Extremity/Trunk Assessment Upper Extremity Assessment Upper Extremity Assessment: Overall WFL for tasks assessed   Lower Extremity Assessment Lower Extremity Assessment: LLE deficits/detail LLE Deficits / Details: grossly 4/5 and pt reports occasional sensory changes 2/2 sciatica, denies sensory deficits at time of evaluation   Cervical / Trunk Assessment Cervical / Trunk Assessment: Normal   Communication     Cognition Arousal/Alertness: Awake/alert Behavior During Therapy: WFL for tasks assessed/performed Overall Cognitive Status: Within Functional Limits for tasks assessed                                     General Comments       Exercises Other Exercises Other Exercises: pt instructed in  falls prevention, energy conservation strategies, breath recovery   Shoulder Instructions      Home Living Family/patient expects to be discharged to:: Other (Comment) [facility]  - older adult living apartments) Living Arrangements: Alone Available Help at Discharge: Available PRN/intermittently;Available 24 hours/day;Personal care attendant Type of Home: Apartment Home Access: Level entry     Home Layout: One level     Bathroom Shower/Tub: Occupational psychologist: Handicapped height     Home Equipment: Other (comment);Walker - 2 wheels   Additional Comments: rollator, lift recliner, adjustable bed      Prior  Functioning/Environment Level of Independence: Needs assistance  Gait / Transfers Assistance Needed: ambulating with rollator primarily, SPC for community mobility ADL's / Homemaking Assistance Needed: indep with most ADL, assist for showers,, meals, cleaning, has HHA for 3hrs/day, 3x/wk for meals, cleaning and showers; has another person (PCA?) who "could be there 24/7 if I needed her" to assist (but unavailable until after Monday)            OT Problem List: Decreased strength;Decreased activity tolerance;Impaired balance (sitting and/or standing);Decreased knowledge of use of DME or AE      OT Treatment/Interventions: Self-care/ADL training;Therapeutic exercise;Therapeutic activities;Energy conservation;DME and/or AE instruction;Patient/family education;Balance training    OT Goals(Current goals can be found in the care plan section) Acute Rehab OT Goals Patient Stated Goal: go back home OT Goal Formulation: With patient Time For Goal Achievement: 05/01/20 Potential to Achieve Goals: Good ADL Goals Pt Will Transfer to Toilet: with modified independence;ambulating (LRAD for amb, elevated commode) Additional ADL Goal #1: Pt will verbalize 2+ learned energy conservation strategies to implement during ADL at home to maximize safety and minimize over exertion.  OT Frequency: Min 1X/week   Barriers to D/C:            Co-evaluation              AM-PAC OT "6 Clicks" Daily Activity     Outcome Measure Help from another person eating meals?: None Help from another person taking care of personal grooming?: None Help from another person toileting, which includes using toliet, bedpan, or urinal?: A Little Help from another person bathing (including washing, rinsing, drying)?: A Little Help from another person to put on and taking off regular upper body clothing?: None Help from another person to put on and taking off regular lower body clothing?: A Little 6 Click Score: 21   End  of Session Equipment Utilized During Treatment: Gait belt;Rolling walker Nurse Communication: Mobility status;Other (comment) (pt reports leaving SPC in ED38 (therapist went and looked but could not find this cane))  Activity Tolerance: Patient tolerated treatment well Patient left: in chair;with call bell/phone within reach;with chair alarm set;Other (comment) (with PT)  OT Visit Diagnosis: Other abnormalities of gait and mobility (R26.89);Muscle weakness (generalized) (M62.81)                Time: 1443-1540 OT Time Calculation (min): 49 min Charges:  OT General Charges $OT Visit: 1 Visit OT Evaluation $OT Eval Moderate Complexity: 1 Mod OT Treatments $Self Care/Home Management : 23-37 mins  Jeni Salles, MPH, MS, OTR/L ascom [phone] 04/17/20, 5:11 PM

## 2020-04-17 NOTE — Evaluation (Signed)
Physical Therapy Evaluation Patient Details Name: [name] MRN: [id number] DOB: [birth year] Today's Date: 04/17/2020   History of Present Illness  Pt is a 79 y.o. male with medical history significant of hypertension, hyperlipidemia, diabetes mellitus, COPD, TIA, GERD, CAD, non-STEMI, tobacco abuse, iron deficiency anemia, who presents with shortness of breath. Patient was recently hospitalized from 7/4-7/6 due to chest pain and non-STEMI.     Clinical Impression  Pt received in chair following OT session.  Pt looking forward to exercises and being able to take a walk.    Pt was able to transfer out of the chair into standing position with supervision.  Pt moves well with walker and notes that he used a rollator prior to coming to hospital.  Pt was able to ambulate however has difficulty with dorsiflexion of the L LE.  Pt instructed on how to utilize the walker and to place weight through arms more heavily when fatigue starts to set in.  Pt also instructed and educated on importance of dorsiflexion in order to clear obstacles in the floor.  Pt was able to dorsiflex the L foot more after verbal cuing, but had decreased carryover as time progressed.  Pt will continue to benefit from skilled therapy to address deficits listed below.  Current d/c recommendation at this time is home with HHPT services.      Follow Up Recommendations Home health PT    Equipment Recommendations  Rolling walker with 5" wheels    Recommendations for Other Services       Precautions / Restrictions Precautions Precautions: Fall Restrictions Weight Bearing Restrictions: No      Mobility  Bed Mobility Overal bed mobility: Modified Independent                Transfers Overall transfer level: Needs assistance Equipment used: Rolling walker (2 wheeled) Transfers: Sit to/from Stand Sit to Stand: Supervision         General transfer comment: no difficulty  Ambulation/Gait Ambulation/Gait  assistance: Modified independent (Device/Increase time) Gait Distance (Feet): 200 Feet Assistive device: Rolling walker (2 wheeled) Gait Pattern/deviations: Step-through pattern;Decreased step length - right;Decreased step length - left;Decreased stride length;Decreased dorsiflexion - left Gait velocity: decreased, but moves well   General Gait Details: pt has decreased ability to dorsiflex the L foot, with verbal cuing is able to increase minimally, but not much carryover.  Stairs            Wheelchair Mobility    Modified Rankin (Stroke Patients Only)       Balance Overall balance assessment: Mild deficits observed, not formally tested                                           Pertinent Vitals/Pain Pain Assessment: No/denies pain    Home Living Family/patient expects to be discharged to:: Other (Comment) [employer]  - older adult living apartments) Living Arrangements: Alone Available Help at Discharge: Available PRN/intermittently;Available 24 hours/day;Personal care attendant Type of Home: Apartment Home Access: Level entry     Home Layout: One level Home Equipment: Other (comment);Walker - 2 wheels Additional Comments: rollator, lift recliner, adjustable bed    Prior Function Level of Independence: Needs assistance   Gait / Transfers Assistance Needed: ambulating with rollator primarily, SPC for community mobility  ADL's / Homemaking Assistance Needed: indep with most ADL, assist for showers,, meals, cleaning, has HHA for  3hrs/day, 3x/wk for meals, cleaning and showers; has another person (PCA?) who "could be there 24/7 if I needed her" to assist (but unavailable until after Monday)        Hand Dominance        Extremity/Trunk Assessment   Upper Extremity Assessment Upper Extremity Assessment: Overall WFL for tasks assessed    Lower Extremity Assessment Lower Extremity Assessment: LLE deficits/detail LLE Deficits / Details:  grossly 4/5 and pt reports occasional sensory changes 2/2 sciatica, denies sensory deficits at time of evaluation    Cervical / Trunk Assessment Cervical / Trunk Assessment: Normal  Communication   Communication: No difficulties  Cognition Arousal/Alertness: Awake/alert Behavior During Therapy: WFL for tasks assessed/performed Overall Cognitive Status: Within Functional Limits for tasks assessed                                        General Comments      Exercises Total Joint Exercises Ankle Circles/Pumps: AROM;Strengthening;Both;10 reps Gluteal Sets: AROM;Strengthening;Both;10 reps Hip ABduction/ADduction: AROM;Strengthening;Both;10 reps Straight Leg Raises: AROM;Strengthening;Both;10 reps Marching in Standing: AROM;Strengthening;Both;10 reps Other Exercises Other Exercises: pt instructed in falls prevention, energy conservation strategies, breath recovery   Assessment/Plan    PT Assessment Patient needs continued PT services  PT Problem List Decreased strength;Decreased activity tolerance;Decreased balance;Decreased mobility;Decreased knowledge of use of DME;Impaired sensation       PT Treatment Interventions DME instruction;Gait training;Stair training;Functional mobility training;Therapeutic activities;Therapeutic exercise;Balance training;Patient/family education    PT Goals (Current goals can be found in the Care Plan section)  Acute Rehab PT Goals Patient Stated Goal: go back home PT Goal Formulation: With patient Time For Goal Achievement: 05/01/20 Potential to Achieve Goals: Good    Frequency Min 2X/week   Barriers to discharge        Co-evaluation               AM-PAC PT "6 Clicks" Mobility  Outcome Measure Help needed turning from your back to your side while in a flat bed without using bedrails?: None Help needed moving from lying on your back to sitting on the side of a flat bed without using bedrails?: None Help needed moving  to and from a bed to a chair (including a wheelchair)?: None Help needed standing up from a chair using your arms (e.g., wheelchair or bedside chair)?: None Help needed to walk in hospital room?: None Help needed climbing 3-5 steps with a railing? : A Little 6 Click Score: 23    End of Session Equipment Utilized During Treatment: Gait belt Activity Tolerance: Patient tolerated treatment well;No increased pain Patient left: in chair;with call bell/phone within reach;with chair alarm set Nurse Communication: Mobility status PT Visit Diagnosis: Unsteadiness on feet (R26.81);Other abnormalities of gait and mobility (R26.89);Muscle weakness (generalized) (M62.81);History of falling (Z91.81);Difficulty in walking, not elsewhere classified (R26.2)    Time: 4098-1191 PT Time Calculation (min) (ACUTE ONLY): 24 min   Charges:   PT Evaluation $PT Eval Low Complexity: 1 Low PT Treatments $Gait Training: 8-22 mins $Therapeutic Exercise: 8-22 mins        Gwenlyn Saran, PT, DPT 04/17/20, 5:56 PM

## 2020-04-18 DIAGNOSIS — E119 Type 2 diabetes mellitus without complications: Secondary | ICD-10-CM | POA: Diagnosis not present

## 2020-04-18 DIAGNOSIS — I10 Essential (primary) hypertension: Secondary | ICD-10-CM | POA: Diagnosis not present

## 2020-04-18 DIAGNOSIS — J441 Chronic obstructive pulmonary disease with (acute) exacerbation: Secondary | ICD-10-CM | POA: Diagnosis not present

## 2020-04-18 LAB — CBC
HCT: 35.5 % — ABNORMAL LOW (ref 39.0–52.0)
Hemoglobin: 11.7 g/dL — ABNORMAL LOW (ref 13.0–17.0)
MCH: 24.9 pg — ABNORMAL LOW (ref 26.0–34.0)
MCHC: 33 g/dL (ref 30.0–36.0)
MCV: 75.7 fL — ABNORMAL LOW (ref 80.0–100.0)
Platelets: 174 10*3/uL (ref 150–400)
RBC: 4.69 MIL/uL (ref 4.22–5.81)
RDW: 18.6 % — ABNORMAL HIGH (ref 11.5–15.5)
WBC: 9.5 10*3/uL (ref 4.0–10.5)
nRBC: 0 % (ref 0.0–0.2)

## 2020-04-18 LAB — BASIC METABOLIC PANEL
Anion gap: 9 (ref 5–15)
BUN: 22 mg/dL (ref 8–23)
CO2: 26 mmol/L (ref 22–32)
Calcium: 8.7 mg/dL — ABNORMAL LOW (ref 8.9–10.3)
Chloride: 101 mmol/L (ref 98–111)
Creatinine, Ser: 0.95 mg/dL (ref 0.61–1.24)
GFR calc Af Amer: >60 mL/min (ref >60)
GFR calc non Af Amer: >60 mL/min (ref >60)
Glucose, Bld: 181 mg/dL — ABNORMAL HIGH (ref 70–99)
Potassium: 4.1 mmol/L (ref 3.5–5.1)
Sodium: 136 mmol/L (ref 135–145)

## 2020-04-18 LAB — GLUCOSE, CAPILLARY: Glucose-Capillary: 156 mg/dL — ABNORMAL HIGH (ref 70–99)

## 2020-04-18 MED ORDER — IPRATROPIUM-ALBUTEROL 0.5-2.5 (3) MG/3ML IN SOLN: 3.0000 mL | Freq: Four times a day (QID) | RESPIRATORY_TRACT | Status: DC

## 2020-04-18 MED ORDER — AZITHROMYCIN 250 MG PO TABS: ORAL_TABLET | ORAL | 0 refills | Status: DC

## 2020-04-18 MED ORDER — PREDNISONE 20 MG PO TABS: 40.0000 mg | ORAL_TABLET | Freq: Every day | ORAL | 0 refills | Status: AC

## 2020-04-18 NOTE — TOC Transition Note (Addendum)
Transition of Care [facility]) - CM/SW Discharge Note   Patient Details  Name: [name] MRN: [id number] Date of Birth: [birth year]  Transition of Care [facility]) CM/SW Contact:  Boris Sharper, LCSW Phone Number: 04/18/2020, 12:02 PM   Clinical Narrative:    Pt medically stable for discharge per MD. Pt will be transported home by his son. CSW was notified by MD that pt would need [facility] PT and OT CSW contacted Corene Cornea with [facility] with referral. And they were able to accept for [facility] PT and OT with a start of care on 8/3. CSW notified MD of start of care and MD was agreeable. Pt notified CSW that he already had a rollator and did not need a RW.   Final next level of care: [place] Barriers to Discharge: No Barriers Identified   Patient Goals and CMS Choice Patient states their goals for this hospitalization and ongoing recovery are:: to gain his independence back CMS Medicare.gov Compare Post Acute Care list provided to:: Patient Choice offered to / list presented to : Patient  Discharge Placement                Patient to be transferred to facility by: Son Name of family member notified: Eddie Dibbles Patient and family notified of of transfer: 04/18/20  Discharge Plan and Services     Post Acute Care Choice: Home Health          DME Arranged: Patient refused services         HH Arranged: PT, OT [place] Agency: [place] ([facility]) Date [place]: 04/18/20 Time [place]: 1202 Representative spoke with at [place]: [place] (SDOH) Interventions     Readmission Risk Interventions No flowsheet data found.

## 2020-04-18 NOTE — Discharge Summary (Signed)
Physician Discharge Summary  [name] LNL:[id number] DOB: 1941/07/09 DOA: 04/16/2020  PCP: Joanie Coddington, MD  Admit date: 04/16/2020 Discharge date: 04/18/2020  Admitted From: home  Disposition: home w/ home health   Recommendations for Outpatient Follow-up:  1. Follow up with PCP in 1-2 weeks   Home Health: yes Equipment/Devices:  Discharge Condition: stable CODE STATUS: full  Diet recommendation: Heart Healthy / Carb Modified  Brief/Interim Summary: HPI was taken from Dr. Blaine Hamper: [name] is a 79 y.o. male with medical history significant of hypertension, hyperlipidemia, diabetes mellitus, COPD, TIA, GERD, CAD, non-STEMI, tobacco abuse, iron deficiency anemia, who presents with shortness of breath.  Patient was recently hospitalized from 7/4-7/6 due to chest pain and non-STEMI. Patient states that in the past several days, he has been having shortness of breath and cough. Symptoms have been progressively worsening. Patient states that his cough is mostly dry cough, but also with little yellow-colored sputum production. No fever or chills. Denies chest pain. Patient has nausea, no vomiting, diarrhea or abdominal pain. No symptoms of UTI. Patient states that he has mild dizziness. No unilateral numbness or tingling in his extremities with no facial droop or slurred speech.  ED Course: pt was found to have WBC 6.9, troponin 66, pending COVID-19 PCR, electrolytes renal function okay, temperature normal, blood pressure 113/67, heart rate 100, RR 24, oxygen saturation 100% on room air. Chest x-ray negative. Patient is placed on MedSurg bed for observation  Hospital Course from Dr. Lenna Sciara. [name] 7/30-7/31/21: Pt was found to have a COPD exacerbation. Pt did receive IV azithromycin, IV steroids & bronchodilators. Pt did not require supplemental oxygen while inpatient. Pt tolerated the treatment stated above & below fairly well. PT/OT saw the pt and recommended home health. Home health was  set up prior to d/c by CM.   Discharge Diagnoses:  Principal Problem:   COPD exacerbation ([place]) Active Problems:   Diabetes mellitus without complication (HCC)   GERD (gastroesophageal reflux disease)   Hypertension   TIA (transient ischemic attack)   Elevated troponin   CAD (coronary artery disease)   HLD (hyperlipidemia)   Tobacco abuse   Dizziness   Iron deficiency anemia  COPD exacerbation:much improved. Continue on bronchodilators,  azithromycin for its anti-inflammatory properties, steroids and incentive spirometry. Mucinex prn for cough   DM2:HbA1c6.2, well controled. Hold home dose of metformin. Continue on SSI w/ accuchecks. Carb modified diet   GERD: continue on pantoprazole  Hypertension: continue on amlodipine, beta blocker. IV hydralazine prn   TIA: continue on lipitor  CAD: w/ elevated troponins.Recently hospitalized due to non-STEMI from 7/4-03/24/20. He had troponin elevation 52-116. No chest pain still. Not taking aspirin as pt is allergic  HLD: continue on stain   Tobacco abuse: nicotine patch to prevent w/drawal. Smoking cessation counseling   Iron deficiency anemia:continue on iron pills   Dizziness:no focal neuro deficit. PT/OT recs home health   Discharge Instructions  Discharge Instructions    Diet Carb Modified   Complete by: As directed    Discharge instructions   Complete by: As directed    F/u PCP in 1 week.   Increase activity slowly   Complete by: As directed      Allergies as of 04/18/2020      Reactions   Lisinopril Swelling   Lip and check swelling   Aspirin    Held due to anemia and fall risk.   Codeine    Ivp Dye [iodinated Diagnostic Agents] Hives, Other (See Comments)  dizzy   Percocet [oxycodone-acetaminophen] Hives, Nausea Only      Medication List    STOP taking these medications   methylPREDNISolone 4 MG Tbpk tablet Commonly known as: MEDROL DOSEPAK     TAKE these medications   Acetaminophen 500  MG capsule Take 1 capsule by mouth every 4 (four) hours as needed for fever or pain.   amLODipine 10 MG tablet Commonly known as: NORVASC Take 1 tablet (10 mg total) by mouth daily.   atorvastatin 40 MG tablet Commonly known as: LIPITOR Take 40 mg by mouth daily.   azithromycin 250 MG tablet Commonly known as: ZITHROMAX 250 mg daily x 4 days Start taking on: April 19, 2020   busPIRone 10 MG tablet Commonly known as: BUSPAR Take 10 mg by mouth 2 (two) times daily.   ferrous sulfate 325 (65 FE) MG tablet Take 325 mg by mouth every other day.   gabapentin 100 MG capsule Commonly known as: NEURONTIN Take 100 mg by mouth 2 (two) times daily.   metFORMIN 850 MG tablet Commonly known as: GLUCOPHAGE Take 850 mg by mouth daily with breakfast.   metoprolol succinate 25 MG 24 hr tablet Commonly known as: TOPROL-XL Take 12.5 mg by mouth daily.   ondansetron 4 MG disintegrating tablet Commonly known as: Zofran ODT Take 1 tablet (4 mg total) by mouth every 8 (eight) hours as needed for nausea or vomiting.   pantoprazole 40 MG tablet Commonly known as: PROTONIX Take 40 mg by mouth daily.   predniSONE 20 MG tablet Commonly known as: Deltasone Take 2 tablets (40 mg total) by mouth daily for 5 days.   ProAir HFA 108 (90 Base) MCG/ACT inhaler Generic drug: albuterol Inhale 2 puffs every 6 (six) hours as needed into the lungs.   pseudoephedrine-guaifenesin 60-600 MG 12 hr tablet Commonly known as: MUCINEX D Take 1 tablet by mouth 2 (two) times daily.   Symbicort 160-4.5 MCG/ACT inhaler Generic drug: budesonide-formoterol Inhale 2 puffs into the lungs 2 (two) times daily.   traZODone 50 MG tablet Commonly known as: DESYREL Take 1 tablet (50 mg total) by mouth at bedtime.       Allergies  Allergen Reactions  . Lisinopril Swelling    Lip and check swelling  . Aspirin     Held due to anemia and fall risk.  . Codeine   . Ivp Dye [Iodinated Diagnostic Agents] Hives and  Other (See Comments)    dizzy  . Percocet [Oxycodone-Acetaminophen] Hives and Nausea Only    Consultations:     Procedures/Studies: DG Chest 2 View  Result Date: 04/16/2020 CLINICAL DATA:  Shortness of breath, dizziness EXAM: CHEST - 2 VIEW COMPARISON:  03/22/2020 FINDINGS: Posterior left diaphragmatic hernia again noted as seen on prior imaging. No confluent airspace opacities or effusions. Heart is normal size. No acute bony abnormality. IMPRESSION: No active cardiopulmonary disease. Electronically Signed   By: Rolm Baptise M.D.   On: 04/16/2020 11:35   DG Chest 2 View  Result Date: 03/22/2020 CLINICAL DATA:  79 year old male with history of chest pain and shortness of breath. EXAM: CHEST - 2 VIEW COMPARISON:  Chest x-ray 01/06/2020. FINDINGS: Eventration of the left hemidiaphragm posteriorly. Lung volumes are normal. No consolidative airspace disease. No pleural effusions. No pneumothorax. No pulmonary nodule or mass noted. Pulmonary vasculature and the cardiomediastinal silhouette are within normal limits. Atherosclerosis in the thoracic aorta. IMPRESSION: 1. No radiographic evidence of acute cardiopulmonary disease. 2. Aortic atherosclerosis. 3. Eventration of the left hemidiaphragm. Electronically  Signed   By: Vinnie Langton M.D.   On: 03/22/2020 05:43   NM Myocar Multi W/Spect W/Wall Motion / EF  Result Date: 03/24/2020  The study is normal.  This is a low risk study.  The left ventricular ejection fraction is normal (55-65%).  There was no ST segment deviation noted during stress.  Negative lexiscan stress LV funciton appears normal No reversible ischemia Low risk study   ECHOCARDIOGRAM COMPLETE  Result Date: 03/22/2020    ECHOCARDIOGRAM REPORT   Patient Name:   [facility] Date of Exam: 03/22/2020 Medical Rec #:  [id number]    Height:       68.0 in Accession #:    [id number]   Weight:       142.0 lb Date of Birth:  1941/07/05    BSA:          1.767 m Patient Age:    29 years      BP:           163/92 mmHg Patient Gender: M            HR:           82 bpm. Exam Location:  [facility] Procedure: 2D Echo Indications:     Chest Pain R07.9  History:         Patient has prior history of Echocardiogram examinations, most                  recent 05/28/2016.  Sonographer:     Arville Go RDCS Referring Phys:  Unknown Foley NIU Diagnosing Phys: Yolonda Kida MD IMPRESSIONS  1. Left ventricular ejection fraction, by estimation, is 50 to 55%. The left ventricle has low normal function. The left ventricle has no regional wall motion abnormalities. Left ventricular diastolic parameters are consistent with Grade III diastolic dysfunction (restrictive).  2. Right ventricular systolic function is normal. The right ventricular size is normal.  3. The mitral valve is normal in structure. No evidence of mitral valve regurgitation.  4. The aortic valve is normal in structure. Aortic valve regurgitation is not visualized. FINDINGS  Left Ventricle: Left ventricular ejection fraction, by estimation, is 50 to 55%. The left ventricle has low normal function. The left ventricle has no regional wall motion abnormalities. The left ventricular internal cavity size was normal in size. There is no left ventricular hypertrophy. Left ventricular diastolic parameters are consistent with Grade III diastolic dysfunction (restrictive). Right Ventricle: The right ventricular size is normal. No increase in right ventricular wall thickness. Right ventricular systolic function is normal. Left Atrium: Left atrial size was normal in size. Right Atrium: Right atrial size was normal in size. Pericardium: There is no evidence of pericardial effusion. Mitral Valve: The mitral valve is normal in structure. No evidence of mitral valve regurgitation. Tricuspid Valve: The tricuspid valve is normal in structure. Tricuspid valve regurgitation is not demonstrated. Aortic Valve: The aortic valve is normal in structure. Aortic valve regurgitation is  not visualized. Aortic valve peak gradient measures 3.6 mmHg. Pulmonic Valve: The pulmonic valve was grossly normal. Pulmonic valve regurgitation is not visualized. Aorta: The aortic root is normal in size and structure. IAS/Shunts: No atrial level shunt detected by color flow Doppler.  LEFT VENTRICLE PLAX 2D LVIDd:         5.08 cm  Diastology LVIDs:         3.50 cm  LV e' lateral:   6.31 cm/s LV PW:  1.37 cm  LV E/e' lateral: 9.2 LV IVS:        1.28 cm LVOT diam:     2.10 cm LV SV:         54 LV SV Index:   31 LVOT Area:     3.46 cm  RIGHT VENTRICLE RV S prime:     13.40 cm/s TAPSE (M-mode): 2.2 cm LEFT ATRIUM           Index       RIGHT ATRIUM           Index LA diam:      3.10 cm 1.75 cm/m  RA Area:     14.90 cm LA Vol (A2C): 13.4 ml 7.58 ml/m  RA Volume:   38.00 ml  21.51 ml/m LA Vol (A4C): 24.4 ml 13.81 ml/m  AORTIC VALVE                PULMONIC VALVE AV Area (Vmax): 2.74 cm    PV Vmax:       0.91 m/s AV Vmax:        95.00 cm/s  PV Peak grad:  3.3 mmHg AV Peak Grad:   3.6 mmHg LVOT Vmax:      75.10 cm/s LVOT Vmean:     44.800 cm/s LVOT VTI:       0.157 m  AORTA Ao Root diam: 3.60 cm Ao Asc diam:  3.60 cm MITRAL VALVE MV Area (PHT): 4.68 cm     SHUNTS MV Decel Time: 162 msec     Systemic VTI:  0.16 m MV E velocity: 58.30 cm/s   Systemic Diam: 2.10 cm MV A velocity: 102.00 cm/s MV E/A ratio:  0.57 Dwayne D Callwood MD Electronically signed by Yolonda Kida MD Signature Date/Time: 03/22/2020/3:39:36 PM    Final      Subjective: Pt c/o malaise   Discharge Exam: Vitals:   04/17/20 2332 04/18/20 0805  BP: 123/83 120/69  Pulse: 86 103  Resp: 18 17  Temp: 98.4 F (36.9 C) 97.9 F (36.6 C)  SpO2: 96% 95%   Vitals:   04/17/20 1617 04/17/20 1634 04/17/20 2332 04/18/20 0805  BP:  (!) 156/85 123/83 120/69  Pulse:  90 86 103  Resp:  19 18 17   Temp:  97.9 F (36.6 C) 98.4 F (36.9 C) 97.9 F (36.6 C)  TempSrc:  Oral  Oral  SpO2: 98% 96% 96% 95%  Weight:      Height:         General: Pt is alert, awake, not in acute distress Cardiovascular:  S1/S2 +, no rubs, no gallops Respiratory: decreased breath sounds b/l, no wheezing, no rhonchi Abdominal: Soft, NT, ND, bowel sounds + Extremities: no edema, no cyanosis    The results of significant diagnostics from this hospitalization (including imaging, microbiology, ancillary and laboratory) are listed below for reference.     Microbiology: No results found for this or any previous visit (from the past 240 hour(s)).   Labs: BNP (last 3 results) Recent Labs    04/16/20 1116  BNP 65.9   Basic Metabolic Panel: Recent Labs  Lab 04/16/20 1116 04/17/20 0518 04/18/20 0401  NA 132* 134* 136  K 3.6 3.7 4.1  CL 94* 99 101  CO2 27 26 26   GLUCOSE 245* 221* 181*  BUN 22 25* 22  CREATININE 1.08 0.97 0.95  CALCIUM 9.1 8.8* 8.7*   Liver Function Tests: No results for input(s): AST, ALT, ALKPHOS, BILITOT, PROT, ALBUMIN in  the last 168 hours. No results for input(s): LIPASE, AMYLASE in the last 168 hours. No results for input(s): AMMONIA in the last 168 hours. CBC: Recent Labs  Lab 04/16/20 1116 04/17/20 0518 04/18/20 0401  WBC 6.9 6.7 9.5  HGB 11.6* 11.4* 11.7*  HCT 36.5* 35.0* 35.5*  MCV 76.5* 75.4* 75.7*  PLT 185 174 174   Cardiac Enzymes: No results for input(s): CKTOTAL, CKMB, CKMBINDEX, TROPONINI in the last 168 hours. BNP: Invalid input(s): POCBNP CBG: Recent Labs  Lab 04/17/20 1156 04/17/20 1531 04/17/20 2122 04/17/20 2308 04/18/20 1208  GLUCAP 64* 221* 136* 151* 156*   D-Dimer No results for input(s): DDIMER in the last 72 hours. Hgb A1c No results for input(s): HGBA1C in the last 72 hours. Lipid Profile No results for input(s): CHOL, HDL, LDLCALC, TRIG, CHOLHDL, LDLDIRECT in the last 72 hours. Thyroid function studies No results for input(s): TSH, T4TOTAL, T3FREE, THYROIDAB in the last 72 hours.  Invalid input(s): FREET3 Anemia work up No results for input(s):  VITAMINB12, FOLATE, FERRITIN, TIBC, IRON, RETICCTPCT in the last 72 hours. Urinalysis    Component Value Date/Time   COLORURINE YELLOW (A) 04/16/2020 1831   APPEARANCEUR CLEAR (A) 04/16/2020 1831   APPEARANCEUR Clear 10/16/2013 0435   LABSPEC 1.014 04/16/2020 1831   LABSPEC 1.015 10/16/2013 0435   PHURINE 5.0 04/16/2020 1831   GLUCOSEU NEGATIVE 04/16/2020 1831   GLUCOSEU Negative 10/16/2013 0435   HGBUR SMALL (A) 04/16/2020 1831   BILIRUBINUR NEGATIVE 04/16/2020 1831   BILIRUBINUR Negative 10/16/2013 [place] 04/16/2020 1831   PROTEINUR 30 (A) 04/16/2020 1831   NITRITE NEGATIVE 04/16/2020 1831   LEUKOCYTESUR NEGATIVE 04/16/2020 1831   LEUKOCYTESUR Negative 10/16/2013 0435   Sepsis Labs Invalid input(s): PROCALCITONIN,  WBC,  LACTICIDVEN Microbiology No results found for this or any previous visit (from the past 240 hour(s)).   Time coordinating discharge: Over 30 minutes  SIGNED:   Wyvonnia Dusky, MD  [facility] 04/18/2020, 1:39 PM Pager   If 7PM-7AM, please contact night-coverage [email or web address]

## 2020-04-28 LAB — GLUCOSE, CAPILLARY: Glucose-Capillary: 141 mg/dL — ABNORMAL HIGH (ref 70–99)

## 2020-05-19 DIAGNOSIS — Z0000 Encounter for general adult medical examination without abnormal findings: Secondary | ICD-10-CM | POA: Diagnosis not present

## 2020-05-31 DIAGNOSIS — R1310 Dysphagia, unspecified: Secondary | ICD-10-CM | POA: Diagnosis not present

## 2020-05-31 DIAGNOSIS — E119 Type 2 diabetes mellitus without complications: Secondary | ICD-10-CM | POA: Diagnosis not present

## 2020-05-31 DIAGNOSIS — R110 Nausea: Secondary | ICD-10-CM | POA: Diagnosis not present

## 2020-05-31 DIAGNOSIS — R634 Abnormal weight loss: Secondary | ICD-10-CM | POA: Diagnosis not present

## 2020-05-31 DIAGNOSIS — R55 Syncope and collapse: Secondary | ICD-10-CM | POA: Diagnosis not present

## 2020-05-31 DIAGNOSIS — E785 Hyperlipidemia, unspecified: Secondary | ICD-10-CM | POA: Diagnosis not present

## 2020-05-31 DIAGNOSIS — R0602 Shortness of breath: Secondary | ICD-10-CM | POA: Diagnosis not present

## 2020-05-31 DIAGNOSIS — J449 Chronic obstructive pulmonary disease, unspecified: Secondary | ICD-10-CM | POA: Diagnosis not present

## 2020-05-31 DIAGNOSIS — I214 Non-ST elevation (NSTEMI) myocardial infarction: Secondary | ICD-10-CM | POA: Diagnosis not present

## 2020-05-31 DIAGNOSIS — D509 Iron deficiency anemia, unspecified: Secondary | ICD-10-CM | POA: Diagnosis not present

## 2020-05-31 DIAGNOSIS — I2510 Atherosclerotic heart disease of native coronary artery without angina pectoris: Secondary | ICD-10-CM | POA: Diagnosis not present

## 2020-05-31 DIAGNOSIS — Z8673 Personal history of transient ischemic attack (TIA), and cerebral infarction without residual deficits: Secondary | ICD-10-CM | POA: Diagnosis not present

## 2020-05-31 DIAGNOSIS — Z86010 Personal history of colonic polyps: Secondary | ICD-10-CM | POA: Diagnosis not present

## 2020-05-31 DIAGNOSIS — K449 Diaphragmatic hernia without obstruction or gangrene: Secondary | ICD-10-CM | POA: Diagnosis not present

## 2020-05-31 DIAGNOSIS — M5430 Sciatica, unspecified side: Secondary | ICD-10-CM | POA: Diagnosis not present

## 2020-05-31 DIAGNOSIS — R5381 Other malaise: Secondary | ICD-10-CM | POA: Diagnosis not present

## 2020-05-31 DIAGNOSIS — R0789 Other chest pain: Secondary | ICD-10-CM | POA: Diagnosis not present

## 2020-05-31 DIAGNOSIS — I10 Essential (primary) hypertension: Secondary | ICD-10-CM | POA: Diagnosis not present

## 2020-05-31 DIAGNOSIS — G4700 Insomnia, unspecified: Secondary | ICD-10-CM | POA: Diagnosis not present

## 2020-05-31 DIAGNOSIS — I82409 Acute embolism and thrombosis of unspecified deep veins of unspecified lower extremity: Secondary | ICD-10-CM | POA: Diagnosis not present

## 2020-05-31 DIAGNOSIS — I4949 Other premature depolarization: Secondary | ICD-10-CM | POA: Diagnosis not present

## 2020-05-31 DIAGNOSIS — Z7982 Long term (current) use of aspirin: Secondary | ICD-10-CM | POA: Diagnosis not present

## 2020-05-31 DIAGNOSIS — E860 Dehydration: Secondary | ICD-10-CM | POA: Diagnosis not present

## 2020-05-31 DIAGNOSIS — I252 Old myocardial infarction: Secondary | ICD-10-CM | POA: Diagnosis not present

## 2020-05-31 DIAGNOSIS — R079 Chest pain, unspecified: Secondary | ICD-10-CM | POA: Diagnosis not present

## 2020-05-31 DIAGNOSIS — F17210 Nicotine dependence, cigarettes, uncomplicated: Secondary | ICD-10-CM | POA: Diagnosis not present

## 2020-06-01 DIAGNOSIS — E860 Dehydration: Secondary | ICD-10-CM | POA: Insufficient documentation

## 2020-06-01 DIAGNOSIS — M5432 Sciatica, left side: Secondary | ICD-10-CM | POA: Insufficient documentation

## 2020-06-01 DIAGNOSIS — R55 Syncope and collapse: Secondary | ICD-10-CM | POA: Diagnosis not present

## 2020-06-01 DIAGNOSIS — R5381 Other malaise: Secondary | ICD-10-CM | POA: Diagnosis not present

## 2020-06-01 DIAGNOSIS — D509 Iron deficiency anemia, unspecified: Secondary | ICD-10-CM | POA: Diagnosis not present

## 2020-06-01 DIAGNOSIS — R0602 Shortness of breath: Secondary | ICD-10-CM | POA: Diagnosis not present

## 2020-06-01 DIAGNOSIS — I5189 Other ill-defined heart diseases: Secondary | ICD-10-CM | POA: Diagnosis not present

## 2020-06-01 DIAGNOSIS — R634 Abnormal weight loss: Secondary | ICD-10-CM | POA: Insufficient documentation

## 2020-06-01 DIAGNOSIS — R638 Other symptoms and signs concerning food and fluid intake: Secondary | ICD-10-CM | POA: Insufficient documentation

## 2020-06-01 DIAGNOSIS — K219 Gastro-esophageal reflux disease without esophagitis: Secondary | ICD-10-CM | POA: Diagnosis not present

## 2020-06-02 DIAGNOSIS — R0602 Shortness of breath: Secondary | ICD-10-CM | POA: Diagnosis not present

## 2020-06-02 DIAGNOSIS — K219 Gastro-esophageal reflux disease without esophagitis: Secondary | ICD-10-CM | POA: Diagnosis not present

## 2020-06-02 DIAGNOSIS — R55 Syncope and collapse: Secondary | ICD-10-CM | POA: Diagnosis not present

## 2020-06-02 DIAGNOSIS — D509 Iron deficiency anemia, unspecified: Secondary | ICD-10-CM | POA: Diagnosis not present

## 2020-06-02 DIAGNOSIS — R5381 Other malaise: Secondary | ICD-10-CM | POA: Diagnosis not present

## 2020-06-03 DIAGNOSIS — R7989 Other specified abnormal findings of blood chemistry: Secondary | ICD-10-CM | POA: Diagnosis not present

## 2020-06-03 DIAGNOSIS — Z86010 Personal history of colonic polyps: Secondary | ICD-10-CM | POA: Diagnosis not present

## 2020-06-03 DIAGNOSIS — R0789 Other chest pain: Secondary | ICD-10-CM | POA: Diagnosis not present

## 2020-06-03 DIAGNOSIS — I214 Non-ST elevation (NSTEMI) myocardial infarction: Secondary | ICD-10-CM | POA: Diagnosis not present

## 2020-06-03 DIAGNOSIS — R110 Nausea: Secondary | ICD-10-CM | POA: Diagnosis not present

## 2020-06-03 DIAGNOSIS — E785 Hyperlipidemia, unspecified: Secondary | ICD-10-CM | POA: Diagnosis not present

## 2020-06-03 DIAGNOSIS — K219 Gastro-esophageal reflux disease without esophagitis: Secondary | ICD-10-CM | POA: Diagnosis not present

## 2020-06-03 DIAGNOSIS — F17200 Nicotine dependence, unspecified, uncomplicated: Secondary | ICD-10-CM | POA: Diagnosis not present

## 2020-06-03 DIAGNOSIS — F17210 Nicotine dependence, cigarettes, uncomplicated: Secondary | ICD-10-CM | POA: Diagnosis not present

## 2020-06-03 DIAGNOSIS — R279 Unspecified lack of coordination: Secondary | ICD-10-CM | POA: Diagnosis not present

## 2020-06-03 DIAGNOSIS — R55 Syncope and collapse: Secondary | ICD-10-CM | POA: Diagnosis not present

## 2020-06-03 DIAGNOSIS — E1129 Type 2 diabetes mellitus with other diabetic kidney complication: Secondary | ICD-10-CM | POA: Diagnosis not present

## 2020-06-03 DIAGNOSIS — E119 Type 2 diabetes mellitus without complications: Secondary | ICD-10-CM | POA: Diagnosis not present

## 2020-06-03 DIAGNOSIS — M6281 Muscle weakness (generalized): Secondary | ICD-10-CM | POA: Diagnosis not present

## 2020-06-03 DIAGNOSIS — I10 Essential (primary) hypertension: Secondary | ICD-10-CM | POA: Diagnosis not present

## 2020-06-03 DIAGNOSIS — Z7982 Long term (current) use of aspirin: Secondary | ICD-10-CM | POA: Diagnosis not present

## 2020-06-03 DIAGNOSIS — I2510 Atherosclerotic heart disease of native coronary artery without angina pectoris: Secondary | ICD-10-CM | POA: Diagnosis not present

## 2020-06-03 DIAGNOSIS — R1310 Dysphagia, unspecified: Secondary | ICD-10-CM | POA: Diagnosis not present

## 2020-06-03 DIAGNOSIS — K449 Diaphragmatic hernia without obstruction or gangrene: Secondary | ICD-10-CM | POA: Diagnosis not present

## 2020-06-03 DIAGNOSIS — G4700 Insomnia, unspecified: Secondary | ICD-10-CM | POA: Diagnosis not present

## 2020-06-03 DIAGNOSIS — I82409 Acute embolism and thrombosis of unspecified deep veins of unspecified lower extremity: Secondary | ICD-10-CM | POA: Diagnosis not present

## 2020-06-03 DIAGNOSIS — R0602 Shortness of breath: Secondary | ICD-10-CM | POA: Diagnosis not present

## 2020-06-03 DIAGNOSIS — M5430 Sciatica, unspecified side: Secondary | ICD-10-CM | POA: Diagnosis not present

## 2020-06-03 DIAGNOSIS — Z8673 Personal history of transient ischemic attack (TIA), and cerebral infarction without residual deficits: Secondary | ICD-10-CM | POA: Diagnosis not present

## 2020-06-03 DIAGNOSIS — R5381 Other malaise: Secondary | ICD-10-CM | POA: Diagnosis not present

## 2020-06-03 DIAGNOSIS — I252 Old myocardial infarction: Secondary | ICD-10-CM | POA: Diagnosis not present

## 2020-06-03 DIAGNOSIS — J449 Chronic obstructive pulmonary disease, unspecified: Secondary | ICD-10-CM | POA: Diagnosis not present

## 2020-06-03 DIAGNOSIS — E569 Vitamin deficiency, unspecified: Secondary | ICD-10-CM | POA: Diagnosis not present

## 2020-06-03 DIAGNOSIS — E860 Dehydration: Secondary | ICD-10-CM | POA: Diagnosis not present

## 2020-06-03 DIAGNOSIS — D509 Iron deficiency anemia, unspecified: Secondary | ICD-10-CM | POA: Diagnosis not present

## 2020-06-05 DIAGNOSIS — Z23 Encounter for immunization: Secondary | ICD-10-CM | POA: Diagnosis not present

## 2020-06-05 DIAGNOSIS — I2510 Atherosclerotic heart disease of native coronary artery without angina pectoris: Secondary | ICD-10-CM | POA: Diagnosis not present

## 2020-06-05 DIAGNOSIS — M5432 Sciatica, left side: Secondary | ICD-10-CM | POA: Diagnosis not present

## 2020-06-05 DIAGNOSIS — R634 Abnormal weight loss: Secondary | ICD-10-CM | POA: Diagnosis not present

## 2020-06-05 DIAGNOSIS — M48061 Spinal stenosis, lumbar region without neurogenic claudication: Secondary | ICD-10-CM | POA: Diagnosis not present

## 2020-06-12 DIAGNOSIS — Z7951 Long term (current) use of inhaled steroids: Secondary | ICD-10-CM | POA: Diagnosis not present

## 2020-06-12 DIAGNOSIS — E038 Other specified hypothyroidism: Secondary | ICD-10-CM | POA: Diagnosis not present

## 2020-06-12 DIAGNOSIS — Z7984 Long term (current) use of oral hypoglycemic drugs: Secondary | ICD-10-CM | POA: Diagnosis not present

## 2020-06-12 DIAGNOSIS — M48061 Spinal stenosis, lumbar region without neurogenic claudication: Secondary | ICD-10-CM | POA: Diagnosis not present

## 2020-06-12 DIAGNOSIS — R1314 Dysphagia, pharyngoesophageal phase: Secondary | ICD-10-CM | POA: Diagnosis not present

## 2020-06-12 DIAGNOSIS — I2510 Atherosclerotic heart disease of native coronary artery without angina pectoris: Secondary | ICD-10-CM | POA: Diagnosis not present

## 2020-06-12 DIAGNOSIS — Z9181 History of falling: Secondary | ICD-10-CM | POA: Diagnosis not present

## 2020-06-12 DIAGNOSIS — E1122 Type 2 diabetes mellitus with diabetic chronic kidney disease: Secondary | ICD-10-CM | POA: Diagnosis not present

## 2020-06-12 DIAGNOSIS — I10 Essential (primary) hypertension: Secondary | ICD-10-CM | POA: Diagnosis not present

## 2020-06-12 DIAGNOSIS — K219 Gastro-esophageal reflux disease without esophagitis: Secondary | ICD-10-CM | POA: Diagnosis not present

## 2020-06-12 DIAGNOSIS — M5136 Other intervertebral disc degeneration, lumbar region: Secondary | ICD-10-CM | POA: Diagnosis not present

## 2020-06-12 DIAGNOSIS — Z8673 Personal history of transient ischemic attack (TIA), and cerebral infarction without residual deficits: Secondary | ICD-10-CM | POA: Diagnosis not present

## 2020-06-12 DIAGNOSIS — E782 Mixed hyperlipidemia: Secondary | ICD-10-CM | POA: Diagnosis not present

## 2020-06-12 DIAGNOSIS — J449 Chronic obstructive pulmonary disease, unspecified: Secondary | ICD-10-CM | POA: Diagnosis not present

## 2020-06-12 DIAGNOSIS — M5432 Sciatica, left side: Secondary | ICD-10-CM | POA: Diagnosis not present

## 2020-06-12 DIAGNOSIS — I252 Old myocardial infarction: Secondary | ICD-10-CM | POA: Diagnosis not present

## 2020-06-12 DIAGNOSIS — N182 Chronic kidney disease, stage 2 (mild): Secondary | ICD-10-CM | POA: Diagnosis not present

## 2020-06-12 DIAGNOSIS — D509 Iron deficiency anemia, unspecified: Secondary | ICD-10-CM | POA: Diagnosis not present

## 2020-06-12 DIAGNOSIS — Z86010 Personal history of colonic polyps: Secondary | ICD-10-CM | POA: Diagnosis not present

## 2020-06-12 DIAGNOSIS — I6523 Occlusion and stenosis of bilateral carotid arteries: Secondary | ICD-10-CM | POA: Diagnosis not present

## 2020-06-12 DIAGNOSIS — F17210 Nicotine dependence, cigarettes, uncomplicated: Secondary | ICD-10-CM | POA: Diagnosis not present

## 2020-07-06 DIAGNOSIS — I214 Non-ST elevation (NSTEMI) myocardial infarction: Secondary | ICD-10-CM | POA: Diagnosis not present

## 2020-07-06 DIAGNOSIS — R000 Tachycardia, unspecified: Secondary | ICD-10-CM | POA: Diagnosis not present

## 2020-07-06 DIAGNOSIS — R42 Dizziness and giddiness: Secondary | ICD-10-CM | POA: Diagnosis not present

## 2020-07-06 DIAGNOSIS — I6523 Occlusion and stenosis of bilateral carotid arteries: Secondary | ICD-10-CM | POA: Diagnosis not present

## 2020-07-06 DIAGNOSIS — I2510 Atherosclerotic heart disease of native coronary artery without angina pectoris: Secondary | ICD-10-CM | POA: Diagnosis not present

## 2020-07-06 DIAGNOSIS — I10 Essential (primary) hypertension: Secondary | ICD-10-CM | POA: Diagnosis not present

## 2020-07-14 DIAGNOSIS — M5432 Sciatica, left side: Secondary | ICD-10-CM | POA: Diagnosis not present

## 2020-07-14 DIAGNOSIS — R634 Abnormal weight loss: Secondary | ICD-10-CM | POA: Diagnosis not present

## 2020-07-14 DIAGNOSIS — F17200 Nicotine dependence, unspecified, uncomplicated: Secondary | ICD-10-CM | POA: Diagnosis not present

## 2020-07-14 DIAGNOSIS — M48061 Spinal stenosis, lumbar region without neurogenic claudication: Secondary | ICD-10-CM | POA: Diagnosis not present

## 2020-07-14 DIAGNOSIS — Z599 Problem related to housing and economic circumstances, unspecified: Secondary | ICD-10-CM | POA: Insufficient documentation

## 2020-07-14 DIAGNOSIS — E1122 Type 2 diabetes mellitus with diabetic chronic kidney disease: Secondary | ICD-10-CM | POA: Diagnosis not present

## 2020-07-24 DIAGNOSIS — I739 Peripheral vascular disease, unspecified: Secondary | ICD-10-CM | POA: Diagnosis not present

## 2020-08-20 ENCOUNTER — Emergency Department
Admission: EM | Admit: 2020-08-20 | Discharge: 2020-08-20 | Disposition: A | Discharge status: Home / Self Care | Payer: Medicare Other | Attending: Emergency Medicine | Admitting: Emergency Medicine

## 2020-08-20 ENCOUNTER — Other Ambulatory Visit: Payer: Self-pay

## 2020-08-20 DIAGNOSIS — Z5321 Procedure and treatment not carried out due to patient leaving prior to being seen by health care provider: Secondary | ICD-10-CM | POA: Diagnosis not present

## 2020-08-20 DIAGNOSIS — R509 Fever, unspecified: Secondary | ICD-10-CM | POA: Diagnosis not present

## 2020-08-20 NOTE — ED Triage Notes (Signed)
Pt to ED via POV for chief complaint of fever.  Pt reports his home health aid came over this morning and told him he had a fever of unknown amount and that he needed to go to ER. Pt reports he has felt fine all morning with no complaints.  Pt afebrile in triage, ambulatory, NAd noted,

## 2020-08-20 NOTE — ED Notes (Signed)
Pt refusing to stay at this time.  Pt states his facility told him he had to be checked out by the ED d/t a temperature of 100.1.  Pt was afebrile upon triage and chose to leave.  Risk and benefits reviewed with the patient at this time.

## 2020-08-31 ENCOUNTER — Encounter (INDEPENDENT_AMBULATORY_CARE_PROVIDER_SITE_OTHER): Payer: Self-pay | Admitting: Vascular Surgery

## 2020-08-31 ENCOUNTER — Ambulatory Visit (INDEPENDENT_AMBULATORY_CARE_PROVIDER_SITE_OTHER): Payer: Medicare Other | Admitting: Vascular Surgery

## 2020-08-31 ENCOUNTER — Other Ambulatory Visit: Payer: Self-pay

## 2020-08-31 VITALS — BP 164/79 | HR 96 | Resp 16 | Ht 67.0 in | Wt 146.0 lb

## 2020-08-31 DIAGNOSIS — I6523 Occlusion and stenosis of bilateral carotid arteries: Secondary | ICD-10-CM | POA: Diagnosis not present

## 2020-08-31 DIAGNOSIS — J449 Chronic obstructive pulmonary disease, unspecified: Secondary | ICD-10-CM

## 2020-08-31 DIAGNOSIS — I25118 Atherosclerotic heart disease of native coronary artery with other forms of angina pectoris: Secondary | ICD-10-CM | POA: Diagnosis not present

## 2020-08-31 DIAGNOSIS — I70223 Atherosclerosis of native arteries of extremities with rest pain, bilateral legs: Secondary | ICD-10-CM

## 2020-08-31 DIAGNOSIS — I10 Essential (primary) hypertension: Secondary | ICD-10-CM

## 2020-08-31 DIAGNOSIS — I70229 Atherosclerosis of native arteries of extremities with rest pain, unspecified extremity: Secondary | ICD-10-CM | POA: Insufficient documentation

## 2020-08-31 DIAGNOSIS — D126 Benign neoplasm of colon, unspecified: Secondary | ICD-10-CM | POA: Insufficient documentation

## 2020-08-31 DIAGNOSIS — I38 Endocarditis, valve unspecified: Secondary | ICD-10-CM | POA: Insufficient documentation

## 2020-08-31 NOTE — Progress Notes (Signed)
MRN : [id number]  [name] is a 79 y.o. ([birth year]) male who presents with chief complaint of  Chief Complaint  Patient presents with  . New Patient (Initial Visit)    Ref white edema  .  History of Present Illness:    The patient is seen for evaluation of painful lower extremities and diminished pulses. Patient notes the pain is always associated with activity and is very consistent day today. Typically, the pain occurs at less than one block, progress is as activity continues to the point that the patient must stop walking. Resting including standing still for several minutes allowed resumption of the activity and the ability to walk a similar distance before stopping again. Uneven terrain and inclined shorten the distance. The pain has been progressive over the past several years. The patient states the inability to walk is now having a profound negative impact on quality of life and daily activities.  The patient describes rest pain of the left foot but denies dangling of an extremity off the side of the bed during the night for relief. No open wounds or sores at this time. No prior interventions or surgeries.  No history of back problems or DJD of the lumbar sacral spine.   The patient denies changes in claudication symptoms or new rest pain symptoms.  No new ulcers or wounds of the foot.  The patient's blood pressure has been stable and relatively well controlled. The patient denies amaurosis fugax or recent TIA symptoms. There are no recent neurological changes noted. The patient denies history of DVT, PE or superficial thrombophlebitis. The patient denies recent episodes of angina or shortness of breath.   Current Meds  Medication Sig  . Acetaminophen 500 MG capsule Take 1 capsule by mouth every 4 (four) hours as needed for fever or pain.   Marland Kitchen albuterol (VENTOLIN HFA) 108 (90 Base) MCG/ACT inhaler Inhale 2 puffs every 6 (six) hours as needed into the lungs.  Marland Kitchen amLODipine  (NORVASC) 10 MG tablet Take 1 tablet (10 mg total) by mouth daily.  Marland Kitchen aspirin 81 MG chewable tablet Chew by mouth.  Marland Kitchen atorvastatin (LIPITOR) 40 MG tablet Take 40 mg by mouth daily.  . budesonide-formoterol (SYMBICORT) 160-4.5 MCG/ACT inhaler Inhale 2 puffs into the lungs 2 (two) times daily.  . busPIRone (BUSPAR) 10 MG tablet Take 10 mg by mouth 2 (two) times daily.  . ferrous sulfate 325 (65 FE) MG tablet Take 325 mg by mouth every other day.  . gabapentin (NEURONTIN) 100 MG capsule Take 100 mg by mouth 2 (two) times daily.   Marland Kitchen lidocaine (LIDODERM) 5 % 1 patch daily.  . metFORMIN (GLUCOPHAGE) 850 MG tablet Take 850 mg by mouth daily with breakfast.   . metoprolol succinate (TOPROL-XL) 25 MG 24 hr tablet Take 12.5 mg by mouth daily.  . ondansetron (ZOFRAN ODT) 4 MG disintegrating tablet Take 1 tablet (4 mg total) by mouth every 8 (eight) hours as needed for nausea or vomiting.  . pantoprazole (PROTONIX) 40 MG tablet Take 40 mg by mouth daily.  . pseudoephedrine-guaifenesin (MUCINEX D) 60-600 MG 12 hr tablet Take 1 tablet by mouth as needed.  . traZODone (DESYREL) 50 MG tablet Take 1 tablet (50 mg total) by mouth at bedtime.    Past Medical History:  Diagnosis Date  . Atypical chest pain    a. 05/2011 Neg MV; b. 04/2013 Neg MV; c. 04/2019 Echo: EF 55-60%, mild BAE. Degen MV dzs. Nl RV fxn; d. 06/2019 MV:  EF 55%, small, mild, fixed apical and apical ant defect - probable artifact. No ischemia.  Marland Kitchen COPD (chronic obstructive pulmonary disease) ([place])   . Coronary artery calcification seen on CT scan    a. 07/2018 CT Chest - cor Ca2+.  . Diabetes mellitus without complication ([place])   . Erosive gastropathy    a. 06/2019 EGD  . GERD (gastroesophageal reflux disease)   . Hypertension   . Pulmonary nodules    a. 07/2018 Chest CT: RML and RLL nodules.  Marland Kitchen TIA (transient ischemic attack)     Past Surgical History:  Procedure Laterality Date  . EYE SURGERY      Social History Social History    Tobacco Use  . Smoking status: Current Every Day Smoker    Packs/day: 0.50    Years: 60.00    Pack years: 30.00  . Smokeless tobacco: Never Used  . Tobacco comment: still smoking 1/2 ppd.  Substance Use Topics  . Alcohol use: Yes  . Drug use: No    Family History Family History  Problem Relation Age of Onset  . Diabetes Mellitus II Mother   . Diabetes Mellitus II Father   . Diabetes Mellitus II Sister   . Diabetes Mellitus II Brother   No family history of bleeding/clotting disorders, porphyria or autoimmune disease   Allergies  Allergen Reactions  . Lisinopril Swelling    Lip and check swelling  . Aspirin     Held due to anemia and fall risk.  . Codeine   . Ivp Dye [Iodinated Diagnostic Agents] Hives and Other (See Comments)    dizzy  . Percocet [Oxycodone-Acetaminophen] Hives and Nausea Only     REVIEW OF SYSTEMS (Negative unless checked)  Constitutional: [] Weight loss  [] Fever  [] Chills Cardiac: [] Chest pain   [] Chest pressure   [] Palpitations   [] Shortness of breath when laying flat   [] Shortness of breath with exertion. Vascular:  [x] Pain in legs with walking   [x] Pain in legs at rest  [] History of DVT   [] Phlebitis   [] Swelling in legs   [] Varicose veins   [] Non-healing ulcers Pulmonary:   [] Uses home oxygen   [] Productive cough   [] Hemoptysis   [] Wheeze  [] COPD   [] Asthma Neurologic:  [] Dizziness   [] Seizures   [] History of stroke   [] History of TIA  [] Aphasia   [] Vissual changes   [] Weakness or numbness in arm   [x] Weakness or numbness in leg Musculoskeletal:   [] Joint swelling   [x] Joint pain   [] Low back pain Hematologic:  [] Easy bruising  [] Easy bleeding   [] Hypercoagulable state   [] Anemic Gastrointestinal:  [] Diarrhea   [] Vomiting  [] Gastroesophageal reflux/heartburn   [] Difficulty swallowing. Genitourinary:  [] Chronic kidney disease   [] Difficult urination  [] Frequent urination   [] Blood in urine Skin:  [] Rashes   [] Ulcers  Psychological:  [] History of  anxiety   []  History of major depression.  Physical Examination  Vitals:   08/31/20 1441  BP: (!) 164/79  Pulse: 96  Resp: 16  Weight: 146 lb (66.2 kg)  Height: 5\' 7"  (1.702 m)   Body mass index is 22.87 kg/m. Gen: WD/WN, NAD Head: /AT, No temporalis wasting.  Ear/Nose/Throat: Hearing grossly intact, nares w/o erythema or drainage, poor dentition Eyes: PER, EOMI, sclera nonicteric.  Neck: Supple, no masses.  No bruit or JVD.  Pulmonary:  Good air movement, clear to auscultation bilaterally, no use of accessory muscles.  Cardiac: RRR, normal S1, S2, no Murmurs. Vascular:  Vessel Right Left  Radial Palpable Palpable  Popliteal Not Palpable Not Palpable  PT Not Palpable Not Palpable  DP Not Palpable Not Palpable  Gastrointestinal: soft, non-distended. No guarding/no peritoneal signs.  Musculoskeletal: M/S 5/5 throughout.  No deformity or atrophy.  Neurologic: CN 2-12 intact. Pain and light touch intact in extremities.  Symmetrical.  Speech is fluent. Motor exam as listed above. Psychiatric: Judgment intact, Mood & affect appropriate for pt's clinical situation. Dermatologic: No rashes or ulcers noted.  No changes consistent with cellulitis.  CBC Lab Results  Component Value Date   WBC 9.5 04/18/2020   HGB 11.7 (L) 04/18/2020   HCT 35.5 (L) 04/18/2020   MCV 75.7 (L) 04/18/2020   PLT 174 04/18/2020    BMET    Component Value Date/Time   NA 136 04/18/2020 0401   NA 133 (L) 10/16/2013 0508   K 4.1 04/18/2020 0401   K 4.2 10/16/2013 0508   CL 101 04/18/2020 0401   CL 101 10/16/2013 0508   CO2 26 04/18/2020 0401   CO2 24 10/16/2013 0508   GLUCOSE 181 (H) 04/18/2020 0401   GLUCOSE 133 (H) 10/16/2013 0508   BUN 22 04/18/2020 0401   BUN 21 (H) 10/16/2013 0508   CREATININE 0.95 04/18/2020 0401   CREATININE 1.43 (H) 10/16/2013 0508   CALCIUM 8.7 (L) 04/18/2020 0401   CALCIUM 8.3 (L) 10/16/2013 0508   GFRNONAA >60 04/18/2020 0401   GFRNONAA 49 (L) 10/16/2013 0508    GFRAA >60 04/18/2020 0401   GFRAA 57 (L) 10/16/2013 0508   CrCl cannot be calculated (Patient's most recent lab result is older than the maximum 21 days allowed.).  COAG Lab Results  Component Value Date   INR 1.1 03/22/2020   INR 1.0 08/29/2019   INR 0.97 05/27/2016    Radiology No results found.   Assessment/Plan 1. Atherosclerosis of native artery of both lower extremities with rest pain ([place]) Recommend:  Patient should undergo arterial duplex of the lower extremity ASAP because there has been a significant deterioration in the patient's lower extremity symptoms.  The patient states they are having increased pain and a marked decrease in the distance that they can walk.  The risks and benefits as well as the alternatives were discussed in detail with the patient.  All questions were answered.  Patient agrees to proceed and understands this could be a prelude to angiography and intervention.  The patient will follow up with me in the office to review the studies.  - VAS [place] LOWER EXTREMITY ARTERIAL DUPLEX; Future - VAS [place] ABI WITH/WO TBI; Future  2. Bilateral carotid artery stenosis Recommend:  Given the patient's asymptomatic subcritical stenosis no further invasive testing or surgery at this time.  Continue antiplatelet therapy as prescribed Continue management of CAD, HTN and Hyperlipidemia Healthy heart diet,  encouraged exercise at least 4 times per week   3. Coronary artery disease of native artery of native heart with stable angina pectoris (HCC) Continue cardiac and antihypertensive medications as already ordered and reviewed, no changes at this time.  Continue statin as ordered and reviewed, no changes at this time  Nitrates PRN for chest pain   4. Primary hypertension Continue antihypertensive medications as already ordered, these medications have been reviewed and there are no changes at this time.   5. Chronic obstructive pulmonary disease, unspecified  COPD type ([place]) Continue pulmonary medications and aerosols as already ordered, these medications have been reviewed and there are no changes at this time.    Hortencia Pilar,  MD  08/31/2020 8:25 PM

## 2020-09-01 DIAGNOSIS — N183 Chronic kidney disease, stage 3 unspecified: Secondary | ICD-10-CM | POA: Diagnosis not present

## 2020-09-01 DIAGNOSIS — Z970 Presence of artificial eye: Secondary | ICD-10-CM | POA: Diagnosis not present

## 2020-09-01 DIAGNOSIS — E1122 Type 2 diabetes mellitus with diabetic chronic kidney disease: Secondary | ICD-10-CM | POA: Diagnosis not present

## 2020-09-01 DIAGNOSIS — H524 Presbyopia: Secondary | ICD-10-CM | POA: Diagnosis not present

## 2020-09-01 DIAGNOSIS — I739 Peripheral vascular disease, unspecified: Secondary | ICD-10-CM | POA: Diagnosis not present

## 2020-09-16 ENCOUNTER — Other Ambulatory Visit: Payer: Self-pay

## 2020-09-16 ENCOUNTER — Ambulatory Visit (INDEPENDENT_AMBULATORY_CARE_PROVIDER_SITE_OTHER): Payer: Medicare Other | Admitting: Nurse Practitioner

## 2020-09-16 ENCOUNTER — Ambulatory Visit (INDEPENDENT_AMBULATORY_CARE_PROVIDER_SITE_OTHER): Payer: Medicare Other

## 2020-09-16 VITALS — BP 174/93 | HR 83 | Ht 67.0 in | Wt 148.0 lb

## 2020-09-16 DIAGNOSIS — J449 Chronic obstructive pulmonary disease, unspecified: Secondary | ICD-10-CM | POA: Diagnosis not present

## 2020-09-16 DIAGNOSIS — I10 Essential (primary) hypertension: Secondary | ICD-10-CM | POA: Diagnosis not present

## 2020-09-16 DIAGNOSIS — I70223 Atherosclerosis of native arteries of extremities with rest pain, bilateral legs: Secondary | ICD-10-CM

## 2020-09-17 ENCOUNTER — Telehealth (INDEPENDENT_AMBULATORY_CARE_PROVIDER_SITE_OTHER): Payer: Self-pay

## 2020-09-17 NOTE — Telephone Encounter (Signed)
Spoke with the patient and he is now scheduled with Dr. Gilda Crease for a LLE angio on 09/29/20 with a 9;00 am arrival time to the [facility]. Covid testing on 09/25/20 between 8-1 pm at the [facility]. Pre-procedure instructions were discussed and will be mailed.

## 2020-09-18 ENCOUNTER — Encounter (INDEPENDENT_AMBULATORY_CARE_PROVIDER_SITE_OTHER): Payer: Self-pay | Admitting: Nurse Practitioner

## 2020-09-18 NOTE — Progress Notes (Signed)
Subjective:    Patient ID: [name], male    DOB: 02-28-1941, 79 y.o.   MRN: [id number] Chief Complaint  Patient presents with  . Follow-up    U/S     The patient returns to the office for followup and review of the noninvasive studies. There has been a significant deterioration in the lower extremity symptoms.  The patient notes interval shortening of their claudication distance and development of mild rest pain symptoms. No new ulcers or wounds have occurred since the last visit.  There have been no significant changes to the patient's overall health care.  The patient denies amaurosis fugax or recent TIA symptoms. There are no recent neurological changes noted. The patient denies history of DVT, PE or superficial thrombophlebitis. The patient denies recent episodes of angina or shortness of breath.   ABI's Rt=1.21 and Lt=0.76 (No previous ABIs) Duplex US of the lower extremity arterial system shows left lower extremity arterial duplex shows biphasic/triphasic waveforms down to the distal popliteal artery.  The distal anterior tibial artery is occluded as well as the posterior tibial proximally.  The patient does have monophasic waveforms in the distal posterior tibial artery and distal peroneal.   Review of Systems  All other systems reviewed and are negative.      Objective:   Physical Exam Vitals reviewed.  Cardiovascular:     Rate and Rhythm: Normal rate.     Pulses: Normal pulses.  Pulmonary:     Effort: Pulmonary effort is normal.  Neurological:     Mental Status: He is alert and oriented to person, place, and time.  Psychiatric:        Mood and Affect: Mood normal.        Behavior: Behavior normal.        Thought Content: Thought content normal.        Judgment: Judgment normal.     BP (!) 174/93 Comment: has not taken BP med yet  Pulse 83   Ht 5\' 7"  (1.702 m)   Wt 148 lb (67.1 kg)   BMI 23.18 kg/m   Past Medical History:  Diagnosis Date  . Atypical  chest pain    a. 05/2011 Neg MV; b. 04/2013 Neg MV; c. 04/2019 Echo: EF 55-60%, mild BAE. Degen MV dzs. Nl RV fxn; d. 06/2019 MV: EF 55%, small, mild, fixed apical and apical ant defect - probable artifact. No ischemia.  Marland Kitchen COPD (chronic obstructive pulmonary disease) ([place])   . Coronary artery calcification seen on CT scan    a. 07/2018 CT Chest - cor Ca2+.  . Diabetes mellitus without complication ([place])   . Erosive gastropathy    a. 06/2019 EGD  . GERD (gastroesophageal reflux disease)   . Hypertension   . Pulmonary nodules    a. 07/2018 Chest CT: RML and RLL nodules.  Marland Kitchen TIA (transient ischemic attack)     Social History   Socioeconomic History  . Marital status: Widowed    Spouse name: Not on file  . Number of children: Not on file  . Years of education: Not on file  . Highest education level: Not on file  Occupational History  . Not on file  Tobacco Use  . Smoking status: Current Every Day Smoker    Packs/day: 0.50    Years: 60.00    Pack years: 30.00  . Smokeless tobacco: Never Used  . Tobacco comment: still smoking 1/2 ppd.  Substance and Sexual Activity  . Alcohol use: Yes  .  Drug use: No  . Sexual activity: Not on file  Other Topics Concern  . Not on file  Social History Narrative   Lives locally by himself.  Children live locally but work and he says he is mostly alone.  Cont to smoke up to 1/2 ppd.  Does not routinely exercise.   Social Determinants of Health   Financial Resource Strain: Not on file  Food Insecurity: Not on file  Transportation Needs: Not on file  Physical Activity: Not on file  Stress: Not on file  Social Connections: Not on file  Intimate Partner Violence: Not on file    Past Surgical History:  Procedure Laterality Date  . EYE SURGERY      Family History  Problem Relation Age of Onset  . Diabetes Mellitus II Mother   . Diabetes Mellitus II Father   . Diabetes Mellitus II Sister   . Diabetes Mellitus II Brother     Allergies   Allergen Reactions  . Lisinopril Swelling    Lip and check swelling  . Aspirin     Held due to anemia and fall risk.  . Codeine   . Ivp Dye [Iodinated Diagnostic Agents] Hives and Other (See Comments)    dizzy  . Percocet [Oxycodone-Acetaminophen] Hives and Nausea Only    CBC Latest Ref Rng & Units 04/18/2020 04/17/2020 04/16/2020  WBC 4.0 - 10.5 K/uL 9.5 6.7 6.9  Hemoglobin 13.0 - 17.0 g/dL 11.7(L) 11.4(L) 11.6(L)  Hematocrit 39.0 - 52.0 % 35.5(L) 35.0(L) 36.5(L)  Platelets 150 - 400 K/uL 174 174 185      CMP     Component Value Date/Time   NA 136 04/18/2020 0401   NA 133 (L) 10/16/2013 0508   K 4.1 04/18/2020 0401   K 4.2 10/16/2013 0508   CL 101 04/18/2020 0401   CL 101 10/16/2013 0508   CO2 26 04/18/2020 0401   CO2 24 10/16/2013 0508   GLUCOSE 181 (H) 04/18/2020 0401   GLUCOSE 133 (H) 10/16/2013 0508   BUN 22 04/18/2020 0401   BUN 21 (H) 10/16/2013 0508   CREATININE 0.95 04/18/2020 0401   CREATININE 1.43 (H) 10/16/2013 0508   CALCIUM 8.7 (L) 04/18/2020 0401   CALCIUM 8.3 (L) 10/16/2013 0508   PROT 6.8 09/01/2019 1931   PROT 7.1 04/25/2013 1837   ALBUMIN 3.6 09/01/2019 1931   ALBUMIN 3.7 04/25/2013 1837   AST 25 09/01/2019 1931   AST 18 04/25/2013 1837   ALT 15 09/01/2019 1931   ALT 12 04/25/2013 1837   ALKPHOS 68 09/01/2019 1931   ALKPHOS 78 04/25/2013 1837   BILITOT 0.4 09/01/2019 1931   BILITOT 0.5 04/25/2013 1837   GFRNONAA >60 04/18/2020 0401   GFRNONAA 49 (L) 10/16/2013 0508   GFRAA >60 04/18/2020 0401   GFRAA 57 (L) 10/16/2013 0508     No results found.     Assessment & Plan:   1. Atherosclerosis of native artery of both lower extremities with rest pain ([place]) Recommend:  The patient has evidence of severe atherosclerotic changes of the left lower extremity with rest pain that is associated with preulcerative changes and impending tissue loss of the foot.  This represents a limb threatening ischemia and places the patient at the risk for limb  loss.  Patient should undergo angiography of the lower extremities with the hope for intervention for limb salvage.  The risks and benefits as well as the alternative therapies was discussed in detail with the patient.  All questions were  answered.  Patient agrees to proceed with angiography.  The patient will follow up with me in the office after the procedure.     2. Primary hypertension Continue antihypertensive medications as already ordered, these medications have been reviewed and there are no changes at this time.   3. Chronic obstructive pulmonary disease, unspecified COPD type (HCC) Continue pulmonary medications and aerosols as already ordered, these medications have been reviewed and there are no changes at this time.     Current Outpatient Medications on File Prior to Visit  Medication Sig Dispense Refill  . Acetaminophen 500 MG capsule Take 1 capsule by mouth every 4 (four) hours as needed for fever or pain.     Marland Kitchen albuterol (VENTOLIN HFA) 108 (90 Base) MCG/ACT inhaler Inhale 2 puffs every 6 (six) hours as needed into the lungs.    Marland Kitchen amLODipine (NORVASC) 10 MG tablet Take 1 tablet (10 mg total) by mouth daily. 30 tablet 0  . aspirin 81 MG chewable tablet Chew by mouth.    Marland Kitchen atorvastatin (LIPITOR) 40 MG tablet Take 40 mg by mouth daily.    . budesonide-formoterol (SYMBICORT) 160-4.5 MCG/ACT inhaler Inhale 2 puffs into the lungs 2 (two) times daily.    . busPIRone (BUSPAR) 10 MG tablet Take 10 mg by mouth 2 (two) times daily.    . ferrous sulfate 325 (65 FE) MG tablet Take 325 mg by mouth every other day.    . gabapentin (NEURONTIN) 100 MG capsule Take 100 mg by mouth 2 (two) times daily.     Marland Kitchen lidocaine (LIDODERM) 5 % 1 patch daily.    . magnesium oxide (MAG-OX) 400 MG tablet Take by mouth.    . metFORMIN (GLUCOPHAGE) 850 MG tablet Take 850 mg by mouth daily with breakfast.     . metoprolol succinate (TOPROL-XL) 25 MG 24 hr tablet Take 12.5 mg by mouth daily.    .  ondansetron (ZOFRAN ODT) 4 MG disintegrating tablet Take 1 tablet (4 mg total) by mouth every 8 (eight) hours as needed for nausea or vomiting. 20 tablet 0  . pantoprazole (PROTONIX) 40 MG tablet Take 40 mg by mouth daily.    . pseudoephedrine-guaifenesin (MUCINEX D) 60-600 MG 12 hr tablet Take 1 tablet by mouth as needed.    . traZODone (DESYREL) 50 MG tablet Take 1 tablet (50 mg total) by mouth at bedtime. 3 tablet 0  . azithromycin (ZITHROMAX) 250 MG tablet 250 mg daily x 4 days (Patient not taking: No sig reported) 4 each 0   No current facility-administered medications on file prior to visit.    There are no Patient Instructions on file for this visit. No follow-ups on file.   Georgiana Spinner, NP

## 2020-09-18 NOTE — H&P (View-Only) (Signed)
Subjective:    Patient ID: [name], male    DOB: March 29, 1941, 79 y.o.   MRN: [id number] Chief Complaint  Patient presents with  . Follow-up    U/S     The patient returns to the office for followup and review of the noninvasive studies. There has been a significant deterioration in the lower extremity symptoms.  The patient notes interval shortening of their claudication distance and development of mild rest pain symptoms. No new ulcers or wounds have occurred since the last visit.  There have been no significant changes to the patient's overall health care.  The patient denies amaurosis fugax or recent TIA symptoms. There are no recent neurological changes noted. The patient denies history of DVT, PE or superficial thrombophlebitis. The patient denies recent episodes of angina or shortness of breath.   ABI's Rt=1.21 and Lt=0.76 (No previous ABIs) Duplex US of the lower extremity arterial system shows left lower extremity arterial duplex shows biphasic/triphasic waveforms down to the distal popliteal artery.  The distal anterior tibial artery is occluded as well as the posterior tibial proximally.  The patient does have monophasic waveforms in the distal posterior tibial artery and distal peroneal.   Review of Systems  All other systems reviewed and are negative.      Objective:   Physical Exam Vitals reviewed.  Cardiovascular:     Rate and Rhythm: Normal rate.     Pulses: Normal pulses.  Pulmonary:     Effort: Pulmonary effort is normal.  Neurological:     Mental Status: He is alert and oriented to person, place, and time.  Psychiatric:        Mood and Affect: Mood normal.        Behavior: Behavior normal.        Thought Content: Thought content normal.        Judgment: Judgment normal.     BP (!) 174/93 Comment: has not taken BP med yet  Pulse 83   Ht 5\' 7"  (1.702 m)   Wt 148 lb (67.1 kg)   BMI 23.18 kg/m   Past Medical History:  Diagnosis Date  . Atypical  chest pain    a. 05/2011 Neg MV; b. 04/2013 Neg MV; c. 04/2019 Echo: EF 55-60%, mild BAE. Degen MV dzs. Nl RV fxn; d. 06/2019 MV: EF 55%, small, mild, fixed apical and apical ant defect - probable artifact. No ischemia.  Marland Kitchen COPD (chronic obstructive pulmonary disease) ([place])   . Coronary artery calcification seen on CT scan    a. 07/2018 CT Chest - cor Ca2+.  . Diabetes mellitus without complication ([place])   . Erosive gastropathy    a. 06/2019 EGD  . GERD (gastroesophageal reflux disease)   . Hypertension   . Pulmonary nodules    a. 07/2018 Chest CT: RML and RLL nodules.  Marland Kitchen TIA (transient ischemic attack)     Social History   Socioeconomic History  . Marital status: Widowed    Spouse name: Not on file  . Number of children: Not on file  . Years of education: Not on file  . Highest education level: Not on file  Occupational History  . Not on file  Tobacco Use  . Smoking status: Current Every Day Smoker    Packs/day: 0.50    Years: 60.00    Pack years: 30.00  . Smokeless tobacco: Never Used  . Tobacco comment: still smoking 1/2 ppd.  Substance and Sexual Activity  . Alcohol use: Yes  .  Drug use: No  . Sexual activity: Not on file  Other Topics Concern  . Not on file  Social History Narrative   Lives locally by himself.  Children live locally but work and he says he is mostly alone.  Cont to smoke up to 1/2 ppd.  Does not routinely exercise.   Social Determinants of Health   Financial Resource Strain: Not on file  Food Insecurity: Not on file  Transportation Needs: Not on file  Physical Activity: Not on file  Stress: Not on file  Social Connections: Not on file  Intimate Partner Violence: Not on file    Past Surgical History:  Procedure Laterality Date  . EYE SURGERY      Family History  Problem Relation Age of Onset  . Diabetes Mellitus II Mother   . Diabetes Mellitus II Father   . Diabetes Mellitus II Sister   . Diabetes Mellitus II Brother     Allergies   Allergen Reactions  . Lisinopril Swelling    Lip and check swelling  . Aspirin     Held due to anemia and fall risk.  . Codeine   . Ivp Dye [Iodinated Diagnostic Agents] Hives and Other (See Comments)    dizzy  . Percocet [Oxycodone-Acetaminophen] Hives and Nausea Only    CBC Latest Ref Rng & Units 04/18/2020 04/17/2020 04/16/2020  WBC 4.0 - 10.5 K/uL 9.5 6.7 6.9  Hemoglobin 13.0 - 17.0 g/dL 11.7(L) 11.4(L) 11.6(L)  Hematocrit 39.0 - 52.0 % 35.5(L) 35.0(L) 36.5(L)  Platelets 150 - 400 K/uL 174 174 185      CMP     Component Value Date/Time   NA 136 04/18/2020 0401   NA 133 (L) 10/16/2013 0508   K 4.1 04/18/2020 0401   K 4.2 10/16/2013 0508   CL 101 04/18/2020 0401   CL 101 10/16/2013 0508   CO2 26 04/18/2020 0401   CO2 24 10/16/2013 0508   GLUCOSE 181 (H) 04/18/2020 0401   GLUCOSE 133 (H) 10/16/2013 0508   BUN 22 04/18/2020 0401   BUN 21 (H) 10/16/2013 0508   CREATININE 0.95 04/18/2020 0401   CREATININE 1.43 (H) 10/16/2013 0508   CALCIUM 8.7 (L) 04/18/2020 0401   CALCIUM 8.3 (L) 10/16/2013 0508   PROT 6.8 09/01/2019 1931   PROT 7.1 04/25/2013 1837   ALBUMIN 3.6 09/01/2019 1931   ALBUMIN 3.7 04/25/2013 1837   AST 25 09/01/2019 1931   AST 18 04/25/2013 1837   ALT 15 09/01/2019 1931   ALT 12 04/25/2013 1837   ALKPHOS 68 09/01/2019 1931   ALKPHOS 78 04/25/2013 1837   BILITOT 0.4 09/01/2019 1931   BILITOT 0.5 04/25/2013 1837   GFRNONAA >60 04/18/2020 0401   GFRNONAA 49 (L) 10/16/2013 0508   GFRAA >60 04/18/2020 0401   GFRAA 57 (L) 10/16/2013 0508     No results found.     Assessment & Plan:   1. Atherosclerosis of native artery of both lower extremities with rest pain ([place]) Recommend:  The patient has evidence of severe atherosclerotic changes of the left lower extremity with rest pain that is associated with preulcerative changes and impending tissue loss of the foot.  This represents a limb threatening ischemia and places the patient at the risk for limb  loss.  Patient should undergo angiography of the lower extremities with the hope for intervention for limb salvage.  The risks and benefits as well as the alternative therapies was discussed in detail with the patient.  All questions were  answered.  Patient agrees to proceed with angiography.  The patient will follow up with me in the office after the procedure.     2. Primary hypertension Continue antihypertensive medications as already ordered, these medications have been reviewed and there are no changes at this time.   3. Chronic obstructive pulmonary disease, unspecified COPD type (HCC) Continue pulmonary medications and aerosols as already ordered, these medications have been reviewed and there are no changes at this time.     Current Outpatient Medications on File Prior to Visit  Medication Sig Dispense Refill  . Acetaminophen 500 MG capsule Take 1 capsule by mouth every 4 (four) hours as needed for fever or pain.     Marland Kitchen albuterol (VENTOLIN HFA) 108 (90 Base) MCG/ACT inhaler Inhale 2 puffs every 6 (six) hours as needed into the lungs.    Marland Kitchen amLODipine (NORVASC) 10 MG tablet Take 1 tablet (10 mg total) by mouth daily. 30 tablet 0  . aspirin 81 MG chewable tablet Chew by mouth.    Marland Kitchen atorvastatin (LIPITOR) 40 MG tablet Take 40 mg by mouth daily.    . budesonide-formoterol (SYMBICORT) 160-4.5 MCG/ACT inhaler Inhale 2 puffs into the lungs 2 (two) times daily.    . busPIRone (BUSPAR) 10 MG tablet Take 10 mg by mouth 2 (two) times daily.    . ferrous sulfate 325 (65 FE) MG tablet Take 325 mg by mouth every other day.    . gabapentin (NEURONTIN) 100 MG capsule Take 100 mg by mouth 2 (two) times daily.     Marland Kitchen lidocaine (LIDODERM) 5 % 1 patch daily.    . magnesium oxide (MAG-OX) 400 MG tablet Take by mouth.    . metFORMIN (GLUCOPHAGE) 850 MG tablet Take 850 mg by mouth daily with breakfast.     . metoprolol succinate (TOPROL-XL) 25 MG 24 hr tablet Take 12.5 mg by mouth daily.    .  ondansetron (ZOFRAN ODT) 4 MG disintegrating tablet Take 1 tablet (4 mg total) by mouth every 8 (eight) hours as needed for nausea or vomiting. 20 tablet 0  . pantoprazole (PROTONIX) 40 MG tablet Take 40 mg by mouth daily.    . pseudoephedrine-guaifenesin (MUCINEX D) 60-600 MG 12 hr tablet Take 1 tablet by mouth as needed.    . traZODone (DESYREL) 50 MG tablet Take 1 tablet (50 mg total) by mouth at bedtime. 3 tablet 0  . azithromycin (ZITHROMAX) 250 MG tablet 250 mg daily x 4 days (Patient not taking: No sig reported) 4 each 0   No current facility-administered medications on file prior to visit.    There are no Patient Instructions on file for this visit. No follow-ups on file.   Georgiana Spinner, NP

## 2020-09-25 ENCOUNTER — Other Ambulatory Visit
Admission: RE | Admit: 2020-09-25 | Discharge: 2020-09-25 | Disposition: A | Discharge status: Home / Self Care | Payer: Medicare Other | Source: Ambulatory Visit | Attending: Vascular Surgery | Admitting: Vascular Surgery

## 2020-09-25 DIAGNOSIS — Z01812 Encounter for preprocedural laboratory examination: Secondary | ICD-10-CM | POA: Insufficient documentation

## 2020-09-25 DIAGNOSIS — Z20822 Contact with and (suspected) exposure to covid-19: Secondary | ICD-10-CM | POA: Insufficient documentation

## 2020-09-26 LAB — SARS CORONAVIRUS 2 (TAT 6-24 HRS): SARS Coronavirus 2: NEGATIVE

## 2020-09-28 ENCOUNTER — Other Ambulatory Visit (INDEPENDENT_AMBULATORY_CARE_PROVIDER_SITE_OTHER): Payer: Self-pay | Admitting: Nurse Practitioner

## 2020-09-29 ENCOUNTER — Encounter
Admission: RE | Disposition: A | Discharge status: Home / Self Care | Payer: Self-pay | Source: Home / Self Care | Attending: Vascular Surgery

## 2020-09-29 ENCOUNTER — Ambulatory Visit
Admission: RE | Admit: 2020-09-29 | Discharge: 2020-09-29 | Disposition: A | Discharge status: Home / Self Care | Payer: Medicare Other | Attending: Vascular Surgery | Admitting: Vascular Surgery

## 2020-09-29 ENCOUNTER — Other Ambulatory Visit: Payer: Self-pay

## 2020-09-29 DIAGNOSIS — J449 Chronic obstructive pulmonary disease, unspecified: Secondary | ICD-10-CM | POA: Diagnosis not present

## 2020-09-29 DIAGNOSIS — Z79899 Other long term (current) drug therapy: Secondary | ICD-10-CM | POA: Insufficient documentation

## 2020-09-29 DIAGNOSIS — I10 Essential (primary) hypertension: Secondary | ICD-10-CM | POA: Diagnosis not present

## 2020-09-29 DIAGNOSIS — I70229 Atherosclerosis of native arteries of extremities with rest pain, unspecified extremity: Secondary | ICD-10-CM

## 2020-09-29 DIAGNOSIS — E1151 Type 2 diabetes mellitus with diabetic peripheral angiopathy without gangrene: Secondary | ICD-10-CM | POA: Diagnosis not present

## 2020-09-29 DIAGNOSIS — Z91041 Radiographic dye allergy status: Secondary | ICD-10-CM | POA: Diagnosis not present

## 2020-09-29 DIAGNOSIS — I70222 Atherosclerosis of native arteries of extremities with rest pain, left leg: Secondary | ICD-10-CM | POA: Insufficient documentation

## 2020-09-29 DIAGNOSIS — Z886 Allergy status to analgesic agent status: Secondary | ICD-10-CM | POA: Diagnosis not present

## 2020-09-29 DIAGNOSIS — F17210 Nicotine dependence, cigarettes, uncomplicated: Secondary | ICD-10-CM | POA: Diagnosis not present

## 2020-09-29 DIAGNOSIS — Z7982 Long term (current) use of aspirin: Secondary | ICD-10-CM | POA: Insufficient documentation

## 2020-09-29 DIAGNOSIS — Z885 Allergy status to narcotic agent status: Secondary | ICD-10-CM | POA: Insufficient documentation

## 2020-09-29 DIAGNOSIS — Z7984 Long term (current) use of oral hypoglycemic drugs: Secondary | ICD-10-CM | POA: Diagnosis not present

## 2020-09-29 DIAGNOSIS — I70211 Atherosclerosis of native arteries of extremities with intermittent claudication, right leg: Secondary | ICD-10-CM | POA: Diagnosis not present

## 2020-09-29 HISTORY — PX: LOWER EXTREMITY ANGIOGRAPHY: CATH118251

## 2020-09-29 LAB — GLUCOSE, CAPILLARY
Glucose-Capillary: 115 mg/dL — ABNORMAL HIGH (ref 70–99)
Glucose-Capillary: 157 mg/dL — ABNORMAL HIGH (ref 70–99)

## 2020-09-29 LAB — CREATININE, SERUM
Creatinine, Ser: 0.87 mg/dL (ref 0.61–1.24)
GFR, Estimated: >60 mL/min (ref >60)

## 2020-09-29 LAB — BUN: BUN: 22 mg/dL (ref 8–23)

## 2020-09-29 SURGERY — LOWER EXTREMITY ANGIOGRAPHY
Anesthesia: Moderate Sedation | Site: Leg Lower | Laterality: Left

## 2020-09-29 MED ORDER — MORPHINE SULFATE (PF) 4 MG/ML IV SOLN: 2.0000 mg | INTRAVENOUS | Status: DC | PRN

## 2020-09-29 MED ORDER — METHYLPREDNISOLONE SODIUM SUCC 125 MG IJ SOLR
125.0000 mg | Freq: Once | INTRAMUSCULAR | Status: AC | PRN
  Administered 2020-09-29: 125 mg via INTRAVENOUS

## 2020-09-29 MED ORDER — CEFAZOLIN SODIUM-DEXTROSE 2-4 GM/100ML-% IV SOLN: 2.0000 g | Freq: Once | INTRAVENOUS | Status: AC

## 2020-09-29 MED ORDER — FENTANYL CITRATE (PF) 100 MCG/2ML IJ SOLN
INTRAMUSCULAR | Status: DC | PRN
  Administered 2020-09-29 (×4): 25 ug via INTRAVENOUS

## 2020-09-29 MED ORDER — IODIXANOL 320 MG/ML IV SOLN
INTRAVENOUS | Status: DC | PRN
  Administered 2020-09-29: 80 mL

## 2020-09-29 MED ORDER — MIDAZOLAM HCL 5 MG/5ML IJ SOLN
INTRAMUSCULAR | Status: AC
  Filled 2020-09-29: qty 5

## 2020-09-29 MED ORDER — FENTANYL CITRATE (PF) 100 MCG/2ML IJ SOLN
INTRAMUSCULAR | Status: AC
  Filled 2020-09-29: qty 2

## 2020-09-29 MED ORDER — MIDAZOLAM HCL 2 MG/ML PO SYRP: 8.0000 mg | ORAL_SOLUTION | Freq: Once | ORAL | Status: DC | PRN

## 2020-09-29 MED ORDER — HEPARIN SODIUM (PORCINE) 1000 UNIT/ML IJ SOLN
INTRAMUSCULAR | Status: AC
  Filled 2020-09-29: qty 1

## 2020-09-29 MED ORDER — HEPARIN SODIUM (PORCINE) 1000 UNIT/ML IJ SOLN
INTRAMUSCULAR | Status: DC | PRN
  Administered 2020-09-29: 5000 [IU] via INTRAVENOUS

## 2020-09-29 MED ORDER — FAMOTIDINE 20 MG PO TABS
ORAL_TABLET | ORAL | Status: AC
  Administered 2020-09-29: 40 mg via ORAL
  Filled 2020-09-29: qty 2

## 2020-09-29 MED ORDER — MIDAZOLAM HCL 2 MG/2ML IJ SOLN
INTRAMUSCULAR | Status: DC | PRN
  Administered 2020-09-29: 0.5 mg via INTRAVENOUS
  Administered 2020-09-29 (×3): 1 mg via INTRAVENOUS

## 2020-09-29 MED ORDER — CLOPIDOGREL BISULFATE 75 MG PO TABS: 75.0000 mg | ORAL_TABLET | Freq: Every day | ORAL | 4 refills | Status: DC

## 2020-09-29 MED ORDER — DIPHENHYDRAMINE HCL 50 MG/ML IJ SOLN
INTRAMUSCULAR | Status: AC
  Filled 2020-09-29: qty 1

## 2020-09-29 MED ORDER — DIPHENHYDRAMINE HCL 50 MG/ML IJ SOLN
50.0000 mg | Freq: Once | INTRAMUSCULAR | Status: AC | PRN
  Administered 2020-09-29: 50 mg via INTRAVENOUS

## 2020-09-29 MED ORDER — CEFAZOLIN SODIUM-DEXTROSE 2-4 GM/100ML-% IV SOLN
INTRAVENOUS | Status: AC
  Administered 2020-09-29: 2 g via INTRAVENOUS
  Filled 2020-09-29: qty 100

## 2020-09-29 MED ORDER — FAMOTIDINE 20 MG PO TABS: 40.0000 mg | ORAL_TABLET | Freq: Once | ORAL | Status: AC | PRN

## 2020-09-29 MED ORDER — FENTANYL CITRATE (PF) 100 MCG/2ML IJ SOLN: 12.5000 ug | Freq: Once | INTRAMUSCULAR | Status: DC | PRN

## 2020-09-29 MED ORDER — SODIUM CHLORIDE 0.9 % IV SOLN: INTRAVENOUS | Status: DC

## 2020-09-29 MED ORDER — METHYLPREDNISOLONE SODIUM SUCC 125 MG IJ SOLR
INTRAMUSCULAR | Status: AC
  Filled 2020-09-29: qty 2

## 2020-09-29 MED ORDER — ASPIRIN 81 MG PO CHEW
CHEWABLE_TABLET | ORAL | Status: AC
  Filled 2020-09-29: qty 1

## 2020-09-29 MED ORDER — ONDANSETRON HCL 4 MG/2ML IJ SOLN: 4.0000 mg | Freq: Four times a day (QID) | INTRAMUSCULAR | Status: DC | PRN

## 2020-09-29 MED ORDER — CLOPIDOGREL BISULFATE 300 MG PO TABS: 300.0000 mg | ORAL_TABLET | ORAL | Status: DC

## 2020-09-29 SURGICAL SUPPLY — 25 items
BALLN LUTONIX DCB 7X40X130 (BALLOONS) ×2
BALLN ULTRASCORE 6X40X130 (BALLOONS) ×2
BALLOON LUTONIX DCB 7X40X130 (BALLOONS) ×1 IMPLANT
BALLOON ULTRASCORE 6X40X130 (BALLOONS) ×1 IMPLANT
CATH BEACON 5 .038 100 VERT TP (CATHETERS) ×2 IMPLANT
CATH CROSSER 14S OTW 146CM (CATHETERS) ×2 IMPLANT
CATH CXI SUPP 2.6F 150 ANG (CATHETERS) ×2 IMPLANT
CATH PIG 70CM (CATHETERS) ×2 IMPLANT
CATH SIDEKICK XL ST 110CM (SHEATH) ×2 IMPLANT
COVER PROBE U/S 5X48 (MISCELLANEOUS) ×2 IMPLANT
DEVICE STARCLOSE SE CLOSURE (Vascular Products) ×2 IMPLANT
DEVICE TORQUE .025-.038 (MISCELLANEOUS) ×2 IMPLANT
GLIDEWIRE ADV .035X260CM (WIRE) ×2 IMPLANT
GLIDEWIRE ANGLED SS 035X260CM (WIRE) ×2 IMPLANT
GUIDEWIRE PFTE-COATED .018X300 (WIRE) ×2 IMPLANT
KIT ENCORE 26 ADVANTAGE (KITS) ×2 IMPLANT
KIT FLOWMATE PROCEDURAL (KITS) ×2 IMPLANT
NEEDLE ENTRY 21GA 7CM ECHOTIP (NEEDLE) ×2 IMPLANT
PACK ANGIOGRAPHY (CUSTOM PROCEDURE TRAY) ×2 IMPLANT
SET INTRO CAPELLA COAXIAL (SET/KITS/TRAYS/PACK) ×2 IMPLANT
SHEATH BRITE TIP 5FRX11 (SHEATH) ×2 IMPLANT
SHEATH FLEXOR ANSEL2 7FRX45 (SHEATH) ×2 IMPLANT
SYR MEDRAD MARK 7 150ML (SYRINGE) ×2 IMPLANT
TUBING CONTRAST HIGH PRESS 72 (TUBING) ×2 IMPLANT
WIRE GUIDERIGHT .035X150 (WIRE) ×2 IMPLANT

## 2020-09-29 NOTE — Discharge Instructions (Signed)
Moderate Conscious Sedation, Adult Sedation is the use of medicines to promote relaxation and to relieve discomfort and anxiety. Moderate conscious sedation is a type of sedation. Under moderate conscious sedation, you are less alert than normal, but you are still able to respond to instructions, touch, or both. Moderate conscious sedation is used during short medical and dental procedures. It is milder than deep sedation, which is a type of sedation under which you cannot be easily woken up. It is also milder than general anesthesia, which is the use of medicines to make you unconscious. Moderate conscious sedation allows you to return to your regular activities sooner. Tell a health care provider about:  Any allergies you have.  All medicines you are taking, including vitamins, herbs, eye drops, creams, and over-the-counter medicines.  Any use of steroids. This includes steroids taken by mouth or as a cream.  Any problems you or family members have had with sedatives and anesthetic medicines.  Any blood disorders you have.  Any surgeries you have had.  Any medical conditions you have, such as sleep apnea.  Whether you are pregnant or may be pregnant.  Any use of cigarettes, alcohol, marijuana, or drugs. What are the risks? Generally, this is a safe procedure. However, problems may occur, including:  Getting too much medicine (oversedation).  Nausea.  Allergic reaction to medicines.  Trouble breathing. If this happens, a breathing tube may be used. It will be removed when you are awake and breathing on your own.  Heart trouble.  Lung trouble.  Confusion that gets better with time (emergence delirium). What happens before the procedure? Staying hydrated Follow instructions from your health care provider about hydration, which may include:  Up to 2 hours before the procedure - you may continue to drink clear liquids, such as water, clear fruit juice, black coffee, and plain  tea. Eating and drinking restrictions Follow instructions from your health care provider about eating and drinking, which may include:  8 hours before the procedure - stop eating heavy meals or foods, such as meat, fried foods, or fatty foods.  6 hours before the procedure - stop eating light meals or foods, such as toast or cereal.  6 hours before the procedure - stop drinking milk or drinks that contain milk.  2 hours before the procedure - stop drinking clear liquids. Medicines Ask your health care provider about:  Changing or stopping your regular medicines. This is especially important if you are taking diabetes medicines or blood thinners.  Taking medicines such as aspirin and ibuprofen. These medicines can thin your blood. Do not take these medicines unless your health care provider tells you to take them.  Taking over-the-counter medicines, vitamins, herbs, and supplements. Tests and exams  You will have a physical exam.  You may have blood tests done to show how well: ? Your kidneys and liver work. ? Your blood clots. General instructions  Plan to have a responsible adult take you home from the hospital or clinic.  If you will be going home right after the procedure, plan to have a responsible adult care for you for the time you are told. This is important. What happens during the procedure?  You will be given the sedative. The sedative may be given: ? As a pill that you will swallow. It can also be inserted into the rectum. ? As a spray through the nose. ? As an injection into the muscle. ? As an injection into the vein through an IV.  You may be given oxygen as needed.  Your breathing, heart rate, and blood pressure will be monitored during the procedure.  The medical or dental procedure will be done. The procedure may vary among health care providers and hospitals.   What happens after the procedure?  Your blood pressure, heart rate, breathing rate, and  blood oxygen level will be monitored until you leave the hospital or clinic.  You will get fluids through your IV if needed.  Do not drive or operate machinery until your health care provider says that it is safe. Summary  Sedation is the use of medicines to promote relaxation and to relieve discomfort and anxiety. Moderate conscious sedation is a type of sedation that is used during short medical and dental procedures.  Tell the health care provider about any medical conditions that you have and about all the medicines that you are taking.  You will be given the sedative as a pill, a spray through the nose, an injection into the muscle, or an injection into the vein through an IV. Vital signs are monitored during the sedation.  Moderate conscious sedation allows you to return to your regular activities sooner. This information is not intended to replace advice given to you by your health care provider. Make sure you discuss any questions you have with your health care provider. Document Revised: 01/03/2020 Document Reviewed: 08/01/2019 Elsevier Patient Education  2021 [place]. Femoral Site Care  This sheet gives you information about how to care for yourself after your procedure. Your health care provider may also give you more specific instructions. If you have problems or questions, contact your health care provider. What can I expect after the procedure? After the procedure, it is common to have:  Bruising that usually fades within 1-2 weeks.  Tenderness at the site. Follow these instructions at home: Wound care  Follow instructions from your health care provider about how to take care of your insertion site. Make sure you: ? Wash your hands with soap and water before you change your bandage (dressing). If soap and water are not available, use hand sanitizer. ? Change your dressing as told by your health care provider. ? Leave stitches (sutures), skin glue, or adhesive  strips in place. These skin closures may need to stay in place for 2 weeks or longer. If adhesive strip edges start to loosen and curl up, you may trim the loose edges. Do not remove adhesive strips completely unless your health care provider tells you to do that.  Do not take baths, swim, or use a hot tub until your health care provider approves.  You may shower 24-48 hours after the procedure or as told by your health care provider. ? Gently wash the site with plain soap and water. ? Pat the area dry with a clean towel. ? Do not rub the site. This may cause bleeding.  Do not apply powder or lotion to the site. Keep the site clean and dry.  Check your femoral site every day for signs of infection. Check for: ? Redness, swelling, or pain. ? Fluid or blood. ? Warmth. ? Pus or a bad smell. Activity  For the first 2-3 days after your procedure, or as long as directed: ? Avoid climbing stairs as much as possible. ? Do not squat.  Do not lift anything that is heavier than 10 lb (4.5 kg), or the limit that you are told, until your health care provider says that it is safe.  Rest  as directed. ? Avoid sitting for a long time without moving. Get up to take short walks every 1-2 hours.  Do not drive for 24 hours if you were given a medicine to help you relax (sedative). General instructions  Take over-the-counter and prescription medicines only as told by your health care provider.  Keep all follow-up visits as told by your health care provider. This is important. Contact a health care provider if you have:  A fever or chills.  You have redness, swelling, or pain around your insertion site. Get help right away if:  The catheter insertion area swells very fast.  You pass out.  You suddenly start to sweat or your skin gets clammy.  The catheter insertion area is bleeding, and the bleeding does not stop when you hold steady pressure on the area.  The area near or just beyond the  catheter insertion site becomes pale, cool, tingly, or numb. These symptoms may represent a serious problem that is an emergency. Do not wait to see if the symptoms will go away. Get medical help right away. Call your local emergency services (911 in the [place] Do not drive yourself to the hospital. Summary  After the procedure, it is common to have bruising that usually fades within 1-2 weeks.  Check your femoral site every day for signs of infection.  Do not lift anything that is heavier than 10 lb (4.5 kg), or the limit that you are told, until your health care provider says that it is safe. This information is not intended to replace advice given to you by your health care provider. Make sure you discuss any questions you have with your health care provider. Document Revised: 05/08/2020 Document Reviewed: 05/08/2020 Elsevier Patient Education  2021 [place]  An angiogram is a procedure used to examine the blood vessels. In this procedure, contrast dye is injected through a long, thin tube (catheter) into an artery. X-rays are then taken, which show if there is a blockage or problem in a blood vessel. The catheter may be inserted in:  The groin area. This is the most common.  The fold of the arm, near the elbow.  The wrist. Tell a health care provider about:  Any allergies you have, including allergies to medicines, shellfish, contrast dye, or iodine.  All medicines you are taking, including vitamins, herbs, eye drops, creams, and over-the-counter medicines.  Any problems you or family members have had with anesthetic medicines.  Any blood disorders you have.  Any surgeries you have had.  Any medical conditions you have or have had, including any kidney problems or kidney failure.  Whether you are pregnant or may be pregnant.  Whether you are breastfeeding. What are the risks? Generally, this is a safe procedure. However, problems may occur,  including:  Infection.  Bleeding and bruising.  Allergic reactions to medicines or dyes, including the contrast dye used.  Damage to nearby structures or organs, including damage to blood vessels and kidney damage from the contrast dye.  Blood clots that can lead to a stroke or heart attack. What happens before the procedure? Staying hydrated Follow instructions from your health care provider about hydration, which may include:  Up to 2 hours before the procedure - you may continue to drink clear liquids, such as water, clear fruit juice, black coffee, and plain tea.   Eating and drinking restrictions Follow instructions from your health care provider about eating and drinking, which may include:  8 hours before  the procedure - stop eating heavy meals or foods, such as meat, fried foods, or fatty foods.  6 hours before the procedure - stop eating light meals or foods, such as toast or cereal.  2 hours before the procedure - stop drinking clear liquids. Medicines Ask your health care provider about:  Changing or stopping your regular medicines. This is especially important if you are taking diabetes medicines or blood thinners.  Taking medicines such as aspirin and ibuprofen. These medicines can thin your blood. Do not take these medicines unless your health care provider tells you to take them.  Taking over-the-counter medicines, vitamins, herbs, and supplements. Surgery safety Ask your health care provider:  How your insertion site will be marked.  What steps will be taken to help prevent infection. These may include: ? Removing hair at the insertion site. ? Washing skin with a germ-killing soap. ? Taking antibiotic medicine. General instructions  Do not use any products that contain nicotine or tobacco for at least 4 weeks before the procedure. These products include cigarettes, e-cigarettes, and chewing tobacco. If you need help quitting, ask your health care  provider.  You may have blood samples taken.  Plan to have someone take you home from the hospital or clinic.  If you will be going home right after the procedure, plan to have someone with you for 24 hours. What happens during the procedure?  You will lie on your back on an X-ray table. You may be strapped to the table if it is tilted.  An IV will be inserted into one of your veins.  Electrodes may be placed on your chest to monitor your heart rate during the procedure.  You will be given one or both of the following: ? A medicine to help you relax (sedative). ? A medicine to numb the area where the catheter will be inserted (local anesthetic).  A small incision will be made for catheter insertion.  The catheter will be inserted into an artery using a guide wire. An X-ray procedure (fluoroscopy) will be used to help guide the catheter to the blood vessel to be examined.  A contrast dye will then be injected into the catheter, and X-rays will be taken. The contrast will help to show where any narrowing or blockages are located in the blood vessels. You may feel flushed as the contrast dye is injected.  Tell your health care provider if you develop chest pain or trouble breathing.  After the fluoroscopy is complete, the catheter will be removed.  A bandage (dressing) will be placed over the site where the catheter was inserted. Pressure will be applied to help stop any bleeding.  The IV will be removed. The procedure may vary among health care providers and hospitals. What happens after the procedure?  Your blood pressure, heart rate, breathing rate, and blood oxygen level will be monitored until you leave the hospital or clinic.  You will be kept in bed lying flat for 6 hours. If the catheter was inserted through your leg, you will be instructed not to bend or cross your legs.  The insertion area and the pulse in your feet or wrist will be checked frequently.  You will be  instructed to drink plenty of fluids. This will help wash the contrast dye out of your body.  You may have more blood tests and X-rays. You may also have a test that records the electrical activity of your heart (electrocardiogram, or ECG).  Do not drive  for 24 hours if you were given a sedative during your procedure.  It is up to you to get the results of your procedure. Ask your health care provider, or the department that is doing the procedure, when your results will be ready. Summary  An angiogram is a procedure used to examine the blood vessels.  Before the procedure, follow your health care provider's instructions about eating and drinking restrictions. You may be asked to stop eating and drinking several hours before the procedure.  During the procedure, contrast dye is injected through a thin tube (catheter) into an artery. X-rays are then taken.  After the procedure, you will need to drink plenty of fluids and lie flat for 6 hour. This information is not intended to replace advice given to you by your health care provider. Make sure you discuss any questions you have with your health care provider. Document Revised: 03/20/2019 Document Reviewed: 03/20/2019 Elsevier Patient Education  [place].

## 2020-09-29 NOTE — Op Note (Signed)
[facility] Percutaneous Study/Intervention Procedural Note   Date of Surgery: 09/29/2020  Surgeon:  Katha Cabal, MD.  Pre-operative Diagnosis: Atherosclerotic occlusive disease bilateral lower extremities with lifestyle limiting claudication and rest pain symptoms of the left lower extremity  Post-operative diagnosis: Same  Procedure(s) Performed: 1. Introduction catheter into left lower extremity 3rd order catheter placement  2. Contrast injection left lower extremity for distal runoff   3. Percutaneous transluminal angioplasty left external iliac artery 4. Attempted crosser atherectomy of the distal left popliteal artery.             5.  star close closure right common femoral arteriotomy  Anesthesia: Conscious sedation was administered under my direct supervision by the interventional radiology RN. IV Versed plus fentanyl were utilized. Continuous ECG, pulse oximetry and blood pressure was monitored throughout the entire procedure.  Conscious sedation was for a total of 1 hour 20 minutes and 51 seconds.  Sheath: 7 [place] Ansell right common femoral retrograde  Contrast: 80 cc  Fluoroscopy Time: 22.2 minutes  Indications: Vu Tippett presents with worsening pain in his left lower extremity.  He is now describing lifestyle limiting claudication and mild rest pain symptoms.  Noninvasive studies as well as physical examination support severe atherosclerotic occlusive disease.  Angiography has been recommended with the hope for intervention.  The risks and benefits are reviewed all questions answered patient agrees to proceed.  Procedure: Nosson Wender is a 80 y.o. y.o. male who was identified and appropriate procedural time out was performed. The patient was then placed supine on the table and prepped and draped in the usual sterile fashion.   Ultrasound was placed in the sterile sleeve and the right  groin was evaluated the right common femoral artery was echolucent and pulsatile indicating patency.  Image was recorded for the permanent record and under real-time visualization a microneedle was inserted into the common femoral artery microwire followed by a micro-sheath.  A J-wire was then advanced through the micro-sheath and a  5 [place] sheath was then inserted over a J-wire. J-wire was then advanced and a 5 French pigtail catheter was positioned at the level of T12. AP projection of the aorta was then obtained. Pigtail catheter was repositioned to above the bifurcation and a RAO view of the pelvis was obtained.  Subsequently a pigtail catheter with the stiff angle Glidewire was used to cross the aortic bifurcation the catheter wire were advanced down into the left distal external iliac artery. Oblique view of the femoral bifurcation was then obtained and subsequently the wire was reintroduced and the pigtail catheter negotiated into the SFA representing third order catheter placement. Distal runoff was then performed.  Diagnostic interpretation: The abdominal aorta is opacified the bolus injection contrast.  There are no hemodynamically significant stenoses noted.  The aortic bifurcation is widely patent.  Bilateral common iliac arteries are mildly diseased without hemodynamically significant stenoses.  On the right the external iliac artery is widely patent.  On the left external iliac artery is patent throughout its proximal and mid segments.  Distally there is a focal 85 to 90% napkin ringlike stenosis.  Beyond this the common femoral artery is widely patent.  Profunda femoris is patent.  The superficial femoral artery demonstrates diffuse disease throughout with multiple 40 to 50% stenoses but there are no focal lesions greater than 65% which courses to an occlusion of the popliteal at the level of the tibial plateau.  Trifurcation is occluded.  There is reconstitution of  the bifurcation of the  tibioperoneal trunk into the posterior tibial which is heavily diseased proximally distally it appears widely patent.  The peroneal appears to be diffusely diseased with multiple hemodynamically significant lesions.  Anterior tibial is occluded throughout its course.  5000 units of heparin was then given and allowed to circulate and a 7 [place] Ansell sheath was advanced up and over the bifurcation and positioned in the femoral artery  Magnified imaging of the external iliac lesion was then performed and initially a 6 mm x 40 mm ultra score balloon is inflated to 10 atm for 1 minute.  Following this a 7 mm x 40 mm Lutonix drug-eluting balloon is inflated across this lesion inflations to 12 atm for 1 full minute.  Follow-up imaging demonstrated less than 10% residual stenosis.  I then replaced the dilator into the sheath and under fluoroscopic guidance advanced the Ansell sheath so that its tip was now in the proximal superficial femoral artery.  Dilator was removed and a straight side kick catheter advanced down to the popliteal occlusion.  A 14 S crosser was then utilized however reentry could not be established into the posterior tibial.  Several different catheters and wires were also used in an attempt to cross this lesion.  Unfortunately, reentry into the true lumen of the posterior tibial was not achieved and I elected to terminate the case.  After review of these images the sheath is pulled into the right external iliac oblique of the common femoral is obtained and a Star close device deployed. There no immediate complications.   Findings:  The abdominal aorta is opacified the bolus injection contrast.  There are no hemodynamically significant stenoses noted.  The aortic bifurcation is widely patent.  Bilateral common iliac arteries are mildly diseased without hemodynamically significant stenoses.  On the right the external iliac artery is widely patent.  On the left external iliac artery is patent  throughout its proximal and mid segments.  Distally there is a focal 85 to 90% napkin ringlike stenosis.  Beyond this the common femoral artery is widely patent.  Profunda femoris is patent.  The superficial femoral artery demonstrates diffuse disease throughout with multiple 40 to 50% stenoses but there are no focal lesions greater than 65% which courses to an occlusion of the popliteal at the level of the tibial plateau.  Trifurcation is occluded.  There is reconstitution of the bifurcation of the tibioperoneal trunk into the posterior tibial which is heavily diseased proximally distally it appears widely patent.  The peroneal appears to be diffusely diseased with multiple hemodynamically significant lesions.  Anterior tibial is occluded throughout its course.  Following angioplasty to 7 mm the distal left external iliac artery is now patent with in-line flow with less than 10% residual stenosis.     Summary: Successful revascularization of the left inflow.  Should the patient's symptoms improve no further intervention will be required at this time.  Should he continue to have lifestyle limiting symptoms and persistent rest pain symptoms then revascularization using pedal access via the posterior tibial distally would certainly be at a therapeutic option.    Disposition: Patient was taken to the recovery room in stable condition having tolerated the procedure well.  Katira Dumais, Dolores Lory 09/29/2020,12:51 PM

## 2020-09-29 NOTE — Interval H&P Note (Signed)
History and Physical Interval Note:  09/29/2020 10:50 AM  [name]  has presented today for surgery, with the diagnosis of LLE Angiography   ASO with rest pain   IVP ALLERGY   Pt to have Covid test on 1-7    BARD Rep   cc: Brayton El.  The various methods of treatment have been discussed with the patient and family. After consideration of risks, benefits and other options for treatment, the patient has consented to  Procedure(s): LOWER EXTREMITY ANGIOGRAPHY (Left) as a surgical intervention.  The patient's history has been reviewed, patient examined, no change in status, stable for surgery.  I have reviewed the patient's chart and labs.  Questions were answered to the patient's satisfaction.     Hortencia Pilar

## 2020-09-30 ENCOUNTER — Encounter: Payer: Self-pay | Admitting: Vascular Surgery

## 2020-10-15 ENCOUNTER — Other Ambulatory Visit (INDEPENDENT_AMBULATORY_CARE_PROVIDER_SITE_OTHER): Payer: Self-pay | Admitting: Vascular Surgery

## 2020-10-15 ENCOUNTER — Encounter (INDEPENDENT_AMBULATORY_CARE_PROVIDER_SITE_OTHER): Payer: Self-pay

## 2020-10-15 DIAGNOSIS — Z9862 Peripheral vascular angioplasty status: Secondary | ICD-10-CM

## 2020-10-15 DIAGNOSIS — I70222 Atherosclerosis of native arteries of extremities with rest pain, left leg: Secondary | ICD-10-CM

## 2020-10-16 ENCOUNTER — Ambulatory Visit (INDEPENDENT_AMBULATORY_CARE_PROVIDER_SITE_OTHER): Payer: Medicare Other

## 2020-10-16 ENCOUNTER — Ambulatory Visit (INDEPENDENT_AMBULATORY_CARE_PROVIDER_SITE_OTHER): Payer: Medicare Other | Admitting: Nurse Practitioner

## 2020-10-16 ENCOUNTER — Other Ambulatory Visit: Payer: Self-pay

## 2020-10-16 ENCOUNTER — Encounter (INDEPENDENT_AMBULATORY_CARE_PROVIDER_SITE_OTHER): Payer: Self-pay | Admitting: Nurse Practitioner

## 2020-10-16 VITALS — BP 132/72 | HR 92 | Ht 67.0 in | Wt 149.0 lb

## 2020-10-16 DIAGNOSIS — I70222 Atherosclerosis of native arteries of extremities with rest pain, left leg: Secondary | ICD-10-CM

## 2020-10-16 DIAGNOSIS — Z9862 Peripheral vascular angioplasty status: Secondary | ICD-10-CM

## 2020-10-16 DIAGNOSIS — I10 Essential (primary) hypertension: Secondary | ICD-10-CM

## 2020-10-16 DIAGNOSIS — E119 Type 2 diabetes mellitus without complications: Secondary | ICD-10-CM

## 2020-10-16 MED ORDER — LIDOCAINE 5 % EX OINT: 1.0000 "application " | TOPICAL_OINTMENT | CUTANEOUS | 0 refills | Status: DC | PRN

## 2020-10-17 ENCOUNTER — Encounter (INDEPENDENT_AMBULATORY_CARE_PROVIDER_SITE_OTHER): Payer: Self-pay | Admitting: Nurse Practitioner

## 2020-10-17 NOTE — Progress Notes (Signed)
Subjective:    Patient ID: [name], male    DOB: August 21, 1941, 80 y.o.   MRN: [id number] Chief Complaint  Patient presents with  . Follow-up    2 wk [facility] post LE angio U/S follow up    The patient returns to the office for followup and review status post angiogram with intervention. The patient notes little  improvement in the lower extremity symptoms. No interval shortening of the patient's claudication distance or rest pain symptoms. Previous wounds have now healed.  No new ulcers or wounds have occurred since the last visit.  The patient had significant atherosclerosis of the distal left popliteal artery.  A crosser atherectomy was attempted however it was unsuccessful.  There have been no significant changes to the patient's overall health care.  The patient denies amaurosis fugax or recent TIA symptoms. There are no recent neurological changes noted. The patient denies history of DVT, PE or superficial thrombophlebitis. The patient denies recent episodes of angina or shortness of breath.   ABI's Rt=1.15 and Lt=0.74 (previous ABI's Rt=1.21 and Lt=0.76) Duplex US of the right lower extremity shows biphasic triphasic waveforms in the tibial arteries whereas the patient has monophasic waveforms with dampened toe waveforms in the left.   Review of Systems     Objective:   Physical Exam  BP 132/72   Pulse 92   Ht 5\' 7"  (1.702 m)   Wt 149 lb (67.6 kg)   BMI 23.34 kg/m   Past Medical History:  Diagnosis Date  . Atypical chest pain    a. 05/2011 Neg MV; b. 04/2013 Neg MV; c. 04/2019 Echo: EF 55-60%, mild BAE. Degen MV dzs. Nl RV fxn; d. 06/2019 MV: EF 55%, small, mild, fixed apical and apical ant defect - probable artifact. No ischemia.  Marland Kitchen COPD (chronic obstructive pulmonary disease) ([place])   . Coronary artery calcification seen on CT scan    a. 07/2018 CT Chest - cor Ca2+.  . Diabetes mellitus without complication ([place])   . Erosive gastropathy    a. 06/2019 EGD  . GERD  (gastroesophageal reflux disease)   . Hypertension   . Pulmonary nodules    a. 07/2018 Chest CT: RML and RLL nodules.  Marland Kitchen TIA (transient ischemic attack)     Social History   Socioeconomic History  . Marital status: Widowed    Spouse name: Not on file  . Number of children: Not on file  . Years of education: Not on file  . Highest education level: Not on file  Occupational History  . Not on file  Tobacco Use  . Smoking status: Current Every Day Smoker    Packs/day: 0.50    Years: 60.00    Pack years: 30.00  . Smokeless tobacco: Never Used  . Tobacco comment: still smoking 1/2 ppd.  Substance and Sexual Activity  . Alcohol use: Yes  . Drug use: No  . Sexual activity: Not on file  Other Topics Concern  . Not on file  Social History Narrative   Lives locally by himself.  Children live locally but work and he says he is mostly alone.  Cont to smoke up to 1/2 ppd.  Does not routinely exercise.   Social Determinants of Health   Financial Resource Strain: Not on file  Food Insecurity: Not on file  Transportation Needs: Not on file  Physical Activity: Not on file  Stress: Not on file  Social Connections: Not on file  Intimate Partner Violence: Not on file  Past Surgical History:  Procedure Laterality Date  . EYE SURGERY    . LOWER EXTREMITY ANGIOGRAPHY Left 09/29/2020   Procedure: LOWER EXTREMITY ANGIOGRAPHY;  Surgeon: Katha Cabal, MD;  Location: [facility];  Service: Cardiovascular;  Laterality: Left;    Family History  Problem Relation Age of Onset  . Diabetes Mellitus II Mother   . Diabetes Mellitus II Father   . Diabetes Mellitus II Sister   . Diabetes Mellitus II Brother     Allergies  Allergen Reactions  . Lisinopril Swelling    Lip and check swelling  . Aspirin     Held due to anemia and fall risk.  . Codeine Other (See Comments)    Sweaty  . Ivp Dye [Iodinated Diagnostic Agents] Hives and Other (See Comments)    dizzy  . Percocet  [Oxycodone-Acetaminophen] Hives and Nausea Only    CBC Latest Ref Rng & Units 04/18/2020 04/17/2020 04/16/2020  WBC 4.0 - 10.5 K/uL 9.5 6.7 6.9  Hemoglobin 13.0 - 17.0 g/dL 11.7(L) 11.4(L) 11.6(L)  Hematocrit 39.0 - 52.0 % 35.5(L) 35.0(L) 36.5(L)  Platelets 150 - 400 K/uL 174 174 185      CMP     Component Value Date/Time   NA 136 04/18/2020 0401   NA 133 (L) 10/16/2013 0508   K 4.1 04/18/2020 0401   K 4.2 10/16/2013 0508   CL 101 04/18/2020 0401   CL 101 10/16/2013 0508   CO2 26 04/18/2020 0401   CO2 24 10/16/2013 0508   GLUCOSE 181 (H) 04/18/2020 0401   GLUCOSE 133 (H) 10/16/2013 0508   BUN 22 09/29/2020 0956   BUN 21 (H) 10/16/2013 0508   CREATININE 0.87 09/29/2020 0956   CREATININE 1.43 (H) 10/16/2013 0508   CALCIUM 8.7 (L) 04/18/2020 0401   CALCIUM 8.3 (L) 10/16/2013 0508   PROT 6.8 09/01/2019 1931   PROT 7.1 04/25/2013 1837   ALBUMIN 3.6 09/01/2019 1931   ALBUMIN 3.7 04/25/2013 1837   AST 25 09/01/2019 1931   AST 18 04/25/2013 1837   ALT 15 09/01/2019 1931   ALT 12 04/25/2013 1837   ALKPHOS 68 09/01/2019 1931   ALKPHOS 78 04/25/2013 1837   BILITOT 0.4 09/01/2019 1931   BILITOT 0.5 04/25/2013 1837   GFRNONAA >60 09/29/2020 0956   GFRNONAA 49 (L) 10/16/2013 0508   GFRAA >60 04/18/2020 0401   GFRAA 57 (L) 10/16/2013 0508     VAS [place] ABI WITH/WO TBI  Result Date: 09/21/2020 LOWER EXTREMITY DOPPLER STUDY Indications: Peripheral artery disease, and left ankle pain.  Comparison Study: outside source ABI r = 1.08 ; lt= .68 Performing Technologist: Concha [place] RVT  Examination Guidelines: A complete evaluation includes at minimum, Doppler waveform signals and systolic blood pressure reading at the level of bilateral brachial, anterior tibial, and posterior tibial arteries, when vessel segments are accessible. Bilateral testing is considered an integral part of a complete examination. Photoelectric Plethysmograph (PPG) waveforms and toe systolic pressure readings are  included as required and additional duplex testing as needed. Limited examinations for reoccurring indications may be performed as noted.  ABI Findings: +---------+------------------+-----+---------+--------+ Right    Rt Pressure (mmHg)IndexWaveform Comment  +---------+------------------+-----+---------+--------+ Brachial 130                                      +---------+------------------+-----+---------+--------+ ATA      157  1.21 biphasic          +---------+------------------+-----+---------+--------+ PTA      145               1.12 triphasic         +---------+------------------+-----+---------+--------+ Great Toe81                0.62 Normal            +---------+------------------+-----+---------+--------+ +---------+------------------+-----+----------+-------+ Left     Lt Pressure (mmHg)IndexWaveform  Comment +---------+------------------+-----+----------+-------+ Brachial 130                                      +---------+------------------+-----+----------+-------+ ATA                             absent            +---------+------------------+-----+----------+-------+ PTA      99                0.76 monophasic        +---------+------------------+-----+----------+-------+ PERO     89                0.68 monophasic        +---------+------------------+-----+----------+-------+ Great Toe75                0.58 Normal            +---------+------------------+-----+----------+-------+  Summary: Right: Resting right ankle-brachial index is within normal range. No evidence of significant right lower extremity arterial disease. The right toe-brachial index is abnormal. Left: Resting left ankle-brachial index indicates moderate left lower extremity arterial disease. The left toe-brachial index is abnormal. By duplex the left proximal pta is occluded and reconstitutes at the ankle. The mid to distal ATA is occluded. The peroneal  shows low monophasic flow distally.  *See table(s) above for measurements and observations.  Electronically signed by Hortencia Pilar MD on 09/21/2020 at 5:18:59 PM.   Final        Assessment & Plan:   1. 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  This represents a limb threatening ischemia and places the patient at the risk for limb loss.  Patient should undergo angiography of the lower extremities with the hope for intervention for limb salvage.  This will be done with a groin and pedal approach.  The risks and benefits as well as the alternative therapies was discussed in detail with the patient.  All questions were answered.  Patient agrees to proceed with angiography.  The patient will follow up with me in the office after the procedure.     The patient also was seen in lidocaine cream for pain relief.  The patient is on several medications that would interact with tramadol.  The patient also has documented allergies to oxycodone preventing hydrocodone or oxycodone prescription for pain.  2. Primary hypertension Continue antihypertensive medications as already ordered, these medications have been reviewed and there are no changes at this time.   3. Diabetes mellitus without complication ([place]) Continue hypoglycemic medications as already ordered, these medications have been reviewed and there are no changes at this time.  Hgb A1C to be monitored  as already arranged by primary service    Current Outpatient Medications on File Prior to Visit  Medication Sig Dispense Refill  . albuterol (VENTOLIN HFA) 108 (90 Base) MCG/ACT inhaler Inhale 2 puffs into the lungs every 6 (six) hours as needed for shortness of breath or wheezing.    Marland Kitchen amLODipine (NORVASC) 10 MG tablet Take  1 tablet (10 mg total) by mouth daily. 30 tablet 0  . aspirin 81 MG chewable tablet Chew 81 mg by mouth daily.    Marland Kitchen atorvastatin (LIPITOR) 40 MG tablet Take 40 mg by mouth daily.    . budesonide-formoterol (SYMBICORT) 160-4.5 MCG/ACT inhaler Inhale 2 puffs into the lungs 2 (two) times daily.    . clopidogrel (PLAVIX) 75 MG tablet Take 1 tablet (75 mg total) by mouth daily. 30 tablet 4  . ferrous sulfate 325 (65 FE) MG tablet Take 325 mg by mouth every other day.    . gabapentin (NEURONTIN) 300 MG capsule Take 300 mg by mouth at bedtime.    . lidocaine (LIDODERM) 5 % Place 1 patch onto the skin daily as needed (Pain).    . magnesium oxide (MAG-OX) 400 MG tablet Take 400 mg by mouth 2 (two) times daily.    . metFORMIN (GLUCOPHAGE) 850 MG tablet Take 850 mg by mouth daily with breakfast.     . metoprolol succinate (TOPROL-XL) 25 MG 24 hr tablet Take 12.5 mg by mouth daily.    . mirtazapine (REMERON) 15 MG tablet Take 15 mg by mouth at bedtime.    . ondansetron (ZOFRAN ODT) 4 MG disintegrating tablet Take 1 tablet (4 mg total) by mouth every 8 (eight) hours as needed for nausea or vomiting. 20 tablet 0  . pantoprazole (PROTONIX) 40 MG tablet Take 40 mg by mouth daily.    . traZODone (DESYREL) 50 MG tablet Take 1 tablet (50 mg total) by mouth at bedtime. (Patient taking differently: Take 50 mg by mouth at bedtime as needed for sleep.) 3 tablet 0  . OneTouch Delica Lancets 27C MISC Apply topically.     No current facility-administered medications on file prior to visit.    There are no Patient Instructions on file for this visit. No follow-ups on file.   Kris Hartmann, NP

## 2020-10-17 NOTE — H&P (View-Only) (Signed)
Subjective:    Patient ID: [name], male    DOB: August 21, 1941, 80 y.o.   MRN: [id number] Chief Complaint  Patient presents with  . Follow-up    2 wk [facility] post LE angio U/S follow up    The patient returns to the office for followup and review status post angiogram with intervention. The patient notes little  improvement in the lower extremity symptoms. No interval shortening of the patient's claudication distance or rest pain symptoms. Previous wounds have now healed.  No new ulcers or wounds have occurred since the last visit.  The patient had significant atherosclerosis of the distal left popliteal artery.  A crosser atherectomy was attempted however it was unsuccessful.  There have been no significant changes to the patient's overall health care.  The patient denies amaurosis fugax or recent TIA symptoms. There are no recent neurological changes noted. The patient denies history of DVT, PE or superficial thrombophlebitis. The patient denies recent episodes of angina or shortness of breath.   ABI's Rt=1.15 and Lt=0.74 (previous ABI's Rt=1.21 and Lt=0.76) Duplex US of the right lower extremity shows biphasic triphasic waveforms in the tibial arteries whereas the patient has monophasic waveforms with dampened toe waveforms in the left.   Review of Systems     Objective:   Physical Exam  BP 132/72   Pulse 92   Ht 5\' 7"  (1.702 m)   Wt 149 lb (67.6 kg)   BMI 23.34 kg/m   Past Medical History:  Diagnosis Date  . Atypical chest pain    a. 05/2011 Neg MV; b. 04/2013 Neg MV; c. 04/2019 Echo: EF 55-60%, mild BAE. Degen MV dzs. Nl RV fxn; d. 06/2019 MV: EF 55%, small, mild, fixed apical and apical ant defect - probable artifact. No ischemia.  Marland Kitchen COPD (chronic obstructive pulmonary disease) ([place])   . Coronary artery calcification seen on CT scan    a. 07/2018 CT Chest - cor Ca2+.  . Diabetes mellitus without complication ([place])   . Erosive gastropathy    a. 06/2019 EGD  . GERD  (gastroesophageal reflux disease)   . Hypertension   . Pulmonary nodules    a. 07/2018 Chest CT: RML and RLL nodules.  Marland Kitchen TIA (transient ischemic attack)     Social History   Socioeconomic History  . Marital status: Widowed    Spouse name: Not on file  . Number of children: Not on file  . Years of education: Not on file  . Highest education level: Not on file  Occupational History  . Not on file  Tobacco Use  . Smoking status: Current Every Day Smoker    Packs/day: 0.50    Years: 60.00    Pack years: 30.00  . Smokeless tobacco: Never Used  . Tobacco comment: still smoking 1/2 ppd.  Substance and Sexual Activity  . Alcohol use: Yes  . Drug use: No  . Sexual activity: Not on file  Other Topics Concern  . Not on file  Social History Narrative   Lives locally by himself.  Children live locally but work and he says he is mostly alone.  Cont to smoke up to 1/2 ppd.  Does not routinely exercise.   Social Determinants of Health   Financial Resource Strain: Not on file  Food Insecurity: Not on file  Transportation Needs: Not on file  Physical Activity: Not on file  Stress: Not on file  Social Connections: Not on file  Intimate Partner Violence: Not on file  Past Surgical History:  Procedure Laterality Date  . EYE SURGERY    . LOWER EXTREMITY ANGIOGRAPHY Left 09/29/2020   Procedure: LOWER EXTREMITY ANGIOGRAPHY;  Surgeon: Katha Cabal, MD;  Location: [facility];  Service: Cardiovascular;  Laterality: Left;    Family History  Problem Relation Age of Onset  . Diabetes Mellitus II Mother   . Diabetes Mellitus II Father   . Diabetes Mellitus II Sister   . Diabetes Mellitus II Brother     Allergies  Allergen Reactions  . Lisinopril Swelling    Lip and check swelling  . Aspirin     Held due to anemia and fall risk.  . Codeine Other (See Comments)    Sweaty  . Ivp Dye [Iodinated Diagnostic Agents] Hives and Other (See Comments)    dizzy  . Percocet  [Oxycodone-Acetaminophen] Hives and Nausea Only    CBC Latest Ref Rng & Units 04/18/2020 04/17/2020 04/16/2020  WBC 4.0 - 10.5 K/uL 9.5 6.7 6.9  Hemoglobin 13.0 - 17.0 g/dL 11.7(L) 11.4(L) 11.6(L)  Hematocrit 39.0 - 52.0 % 35.5(L) 35.0(L) 36.5(L)  Platelets 150 - 400 K/uL 174 174 185      CMP     Component Value Date/Time   NA 136 04/18/2020 0401   NA 133 (L) 10/16/2013 0508   K 4.1 04/18/2020 0401   K 4.2 10/16/2013 0508   CL 101 04/18/2020 0401   CL 101 10/16/2013 0508   CO2 26 04/18/2020 0401   CO2 24 10/16/2013 0508   GLUCOSE 181 (H) 04/18/2020 0401   GLUCOSE 133 (H) 10/16/2013 0508   BUN 22 09/29/2020 0956   BUN 21 (H) 10/16/2013 0508   CREATININE 0.87 09/29/2020 0956   CREATININE 1.43 (H) 10/16/2013 0508   CALCIUM 8.7 (L) 04/18/2020 0401   CALCIUM 8.3 (L) 10/16/2013 0508   PROT 6.8 09/01/2019 1931   PROT 7.1 04/25/2013 1837   ALBUMIN 3.6 09/01/2019 1931   ALBUMIN 3.7 04/25/2013 1837   AST 25 09/01/2019 1931   AST 18 04/25/2013 1837   ALT 15 09/01/2019 1931   ALT 12 04/25/2013 1837   ALKPHOS 68 09/01/2019 1931   ALKPHOS 78 04/25/2013 1837   BILITOT 0.4 09/01/2019 1931   BILITOT 0.5 04/25/2013 1837   GFRNONAA >60 09/29/2020 0956   GFRNONAA 49 (L) 10/16/2013 0508   GFRAA >60 04/18/2020 0401   GFRAA 57 (L) 10/16/2013 0508     VAS [place] ABI WITH/WO TBI  Result Date: 09/21/2020 LOWER EXTREMITY DOPPLER STUDY Indications: Peripheral artery disease, and left ankle pain.  Comparison Study: outside source ABI r = 1.08 ; lt= .68 Performing Technologist: Concha Norway RVT  Examination Guidelines: A complete evaluation includes at minimum, Doppler waveform signals and systolic blood pressure reading at the level of bilateral brachial, anterior tibial, and posterior tibial arteries, when vessel segments are accessible. Bilateral testing is considered an integral part of a complete examination. Photoelectric Plethysmograph (PPG) waveforms and toe systolic pressure readings are  included as required and additional duplex testing as needed. Limited examinations for reoccurring indications may be performed as noted.  ABI Findings: +---------+------------------+-----+---------+--------+ Right    Rt Pressure (mmHg)IndexWaveform Comment  +---------+------------------+-----+---------+--------+ Brachial 130                                      +---------+------------------+-----+---------+--------+ ATA      157  1.21 biphasic          +---------+------------------+-----+---------+--------+ PTA      145               1.12 triphasic         +---------+------------------+-----+---------+--------+ Great Toe81                0.62 Normal            +---------+------------------+-----+---------+--------+ +---------+------------------+-----+----------+-------+ Left     Lt Pressure (mmHg)IndexWaveform  Comment +---------+------------------+-----+----------+-------+ Brachial 130                                      +---------+------------------+-----+----------+-------+ ATA                             absent            +---------+------------------+-----+----------+-------+ PTA      99                0.76 monophasic        +---------+------------------+-----+----------+-------+ PERO     89                0.68 monophasic        +---------+------------------+-----+----------+-------+ Great Toe75                0.58 Normal            +---------+------------------+-----+----------+-------+  Summary: Right: Resting right ankle-brachial index is within normal range. No evidence of significant right lower extremity arterial disease. The right toe-brachial index is abnormal. Left: Resting left ankle-brachial index indicates moderate left lower extremity arterial disease. The left toe-brachial index is abnormal. By duplex the left proximal pta is occluded and reconstitutes at the ankle. The mid to distal ATA is occluded. The peroneal  shows low monophasic flow distally.  *See table(s) above for measurements and observations.  Electronically signed by Hortencia Pilar MD on 09/21/2020 at 5:18:59 PM.   Final        Assessment & Plan:   1. 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  This represents a limb threatening ischemia and places the patient at the risk for limb loss.  Patient should undergo angiography of the lower extremities with the hope for intervention for limb salvage.  This will be done with a groin and pedal approach.  The risks and benefits as well as the alternative therapies was discussed in detail with the patient.  All questions were answered.  Patient agrees to proceed with angiography.  The patient will follow up with me in the office after the procedure.     The patient also was seen in lidocaine cream for pain relief.  The patient is on several medications that would interact with tramadol.  The patient also has documented allergies to oxycodone preventing hydrocodone or oxycodone prescription for pain.  2. Primary hypertension Continue antihypertensive medications as already ordered, these medications have been reviewed and there are no changes at this time.   3. Diabetes mellitus without complication ([place]) Continue hypoglycemic medications as already ordered, these medications have been reviewed and there are no changes at this time.  Hgb A1C to be monitored  as already arranged by primary service    Current Outpatient Medications on File Prior to Visit  Medication Sig Dispense Refill  . albuterol (VENTOLIN HFA) 108 (90 Base) MCG/ACT inhaler Inhale 2 puffs into the lungs every 6 (six) hours as needed for shortness of breath or wheezing.    Marland Kitchen amLODipine (NORVASC) 10 MG tablet Take  1 tablet (10 mg total) by mouth daily. 30 tablet 0  . aspirin 81 MG chewable tablet Chew 81 mg by mouth daily.    Marland Kitchen atorvastatin (LIPITOR) 40 MG tablet Take 40 mg by mouth daily.    . budesonide-formoterol (SYMBICORT) 160-4.5 MCG/ACT inhaler Inhale 2 puffs into the lungs 2 (two) times daily.    . clopidogrel (PLAVIX) 75 MG tablet Take 1 tablet (75 mg total) by mouth daily. 30 tablet 4  . ferrous sulfate 325 (65 FE) MG tablet Take 325 mg by mouth every other day.    . gabapentin (NEURONTIN) 300 MG capsule Take 300 mg by mouth at bedtime.    . lidocaine (LIDODERM) 5 % Place 1 patch onto the skin daily as needed (Pain).    . magnesium oxide (MAG-OX) 400 MG tablet Take 400 mg by mouth 2 (two) times daily.    . metFORMIN (GLUCOPHAGE) 850 MG tablet Take 850 mg by mouth daily with breakfast.     . metoprolol succinate (TOPROL-XL) 25 MG 24 hr tablet Take 12.5 mg by mouth daily.    . mirtazapine (REMERON) 15 MG tablet Take 15 mg by mouth at bedtime.    . ondansetron (ZOFRAN ODT) 4 MG disintegrating tablet Take 1 tablet (4 mg total) by mouth every 8 (eight) hours as needed for nausea or vomiting. 20 tablet 0  . pantoprazole (PROTONIX) 40 MG tablet Take 40 mg by mouth daily.    . traZODone (DESYREL) 50 MG tablet Take 1 tablet (50 mg total) by mouth at bedtime. (Patient taking differently: Take 50 mg by mouth at bedtime as needed for sleep.) 3 tablet 0  . OneTouch Delica Lancets 27C MISC Apply topically.     No current facility-administered medications on file prior to visit.    There are no Patient Instructions on file for this visit. No follow-ups on file.   Kris Hartmann, NP

## 2020-10-29 ENCOUNTER — Telehealth (INDEPENDENT_AMBULATORY_CARE_PROVIDER_SITE_OTHER): Payer: Self-pay

## 2020-10-29 NOTE — Telephone Encounter (Signed)
Spoke with the patient and he is scheduled with Dr. Delana Meyer for a LLE angio on 11/10/20 with a 6:45 am arrival time to the [facility]. Covid testing on 11/06/20 between 8-1 pm at the [facility]. Pre-procedure instructions were discussed and will be mailed.

## 2020-11-06 ENCOUNTER — Other Ambulatory Visit: Payer: Self-pay

## 2020-11-06 ENCOUNTER — Other Ambulatory Visit
Admission: RE | Admit: 2020-11-06 | Discharge: 2020-11-06 | Disposition: A | Discharge status: Home / Self Care | Payer: Medicare Other | Source: Ambulatory Visit | Attending: Vascular Surgery | Admitting: Vascular Surgery

## 2020-11-06 DIAGNOSIS — Z01812 Encounter for preprocedural laboratory examination: Secondary | ICD-10-CM | POA: Diagnosis not present

## 2020-11-06 DIAGNOSIS — Z20822 Contact with and (suspected) exposure to covid-19: Secondary | ICD-10-CM | POA: Insufficient documentation

## 2020-11-06 LAB — SARS CORONAVIRUS 2 (TAT 6-24 HRS): SARS Coronavirus 2: NEGATIVE

## 2020-11-09 ENCOUNTER — Other Ambulatory Visit (INDEPENDENT_AMBULATORY_CARE_PROVIDER_SITE_OTHER): Payer: Self-pay | Admitting: Nurse Practitioner

## 2020-11-10 ENCOUNTER — Encounter: Payer: Self-pay | Admitting: Vascular Surgery

## 2020-11-10 ENCOUNTER — Ambulatory Visit
Admission: RE | Admit: 2020-11-10 | Discharge: 2020-11-10 | Disposition: A | Discharge status: Home / Self Care | Payer: Medicare Other | Attending: Vascular Surgery | Admitting: Vascular Surgery

## 2020-11-10 ENCOUNTER — Encounter
Admission: RE | Disposition: A | Discharge status: Home / Self Care | Payer: Self-pay | Source: Home / Self Care | Attending: Vascular Surgery

## 2020-11-10 ENCOUNTER — Other Ambulatory Visit: Payer: Self-pay

## 2020-11-10 DIAGNOSIS — Z7902 Long term (current) use of antithrombotics/antiplatelets: Secondary | ICD-10-CM | POA: Insufficient documentation

## 2020-11-10 DIAGNOSIS — Z886 Allergy status to analgesic agent status: Secondary | ICD-10-CM | POA: Diagnosis not present

## 2020-11-10 DIAGNOSIS — Z79899 Other long term (current) drug therapy: Secondary | ICD-10-CM | POA: Insufficient documentation

## 2020-11-10 DIAGNOSIS — I70222 Atherosclerosis of native arteries of extremities with rest pain, left leg: Secondary | ICD-10-CM | POA: Insufficient documentation

## 2020-11-10 DIAGNOSIS — I10 Essential (primary) hypertension: Secondary | ICD-10-CM | POA: Diagnosis not present

## 2020-11-10 DIAGNOSIS — E1151 Type 2 diabetes mellitus with diabetic peripheral angiopathy without gangrene: Secondary | ICD-10-CM | POA: Insufficient documentation

## 2020-11-10 DIAGNOSIS — Z7982 Long term (current) use of aspirin: Secondary | ICD-10-CM | POA: Diagnosis not present

## 2020-11-10 DIAGNOSIS — Z888 Allergy status to other drugs, medicaments and biological substances status: Secondary | ICD-10-CM | POA: Diagnosis not present

## 2020-11-10 DIAGNOSIS — Z885 Allergy status to narcotic agent status: Secondary | ICD-10-CM | POA: Insufficient documentation

## 2020-11-10 DIAGNOSIS — Z7984 Long term (current) use of oral hypoglycemic drugs: Secondary | ICD-10-CM | POA: Insufficient documentation

## 2020-11-10 DIAGNOSIS — F17210 Nicotine dependence, cigarettes, uncomplicated: Secondary | ICD-10-CM | POA: Diagnosis not present

## 2020-11-10 DIAGNOSIS — Z91041 Radiographic dye allergy status: Secondary | ICD-10-CM | POA: Diagnosis not present

## 2020-11-10 DIAGNOSIS — I70229 Atherosclerosis of native arteries of extremities with rest pain, unspecified extremity: Secondary | ICD-10-CM

## 2020-11-10 HISTORY — PX: LOWER EXTREMITY ANGIOGRAPHY: CATH118251

## 2020-11-10 LAB — GLUCOSE, CAPILLARY: Glucose-Capillary: 141 mg/dL — ABNORMAL HIGH (ref 70–99)

## 2020-11-10 LAB — CREATININE, SERUM
Creatinine, Ser: 0.88 mg/dL (ref 0.61–1.24)
GFR, Estimated: >60 mL/min (ref >60)

## 2020-11-10 LAB — BUN: BUN: 23 mg/dL (ref 8–23)

## 2020-11-10 SURGERY — LOWER EXTREMITY ANGIOGRAPHY
Anesthesia: Moderate Sedation | Site: Leg Lower | Laterality: Left

## 2020-11-10 MED ORDER — MIDAZOLAM HCL 5 MG/5ML IJ SOLN
INTRAMUSCULAR | Status: AC
  Filled 2020-11-10: qty 5

## 2020-11-10 MED ORDER — MIDAZOLAM HCL 2 MG/2ML IJ SOLN
INTRAMUSCULAR | Status: DC | PRN
  Administered 2020-11-10: 0.5 mg via INTRAVENOUS
  Administered 2020-11-10: 2 mg via INTRAVENOUS
  Administered 2020-11-10: 0.5 mg via INTRAVENOUS

## 2020-11-10 MED ORDER — HYDRALAZINE HCL 20 MG/ML IJ SOLN: 5.0000 mg | INTRAMUSCULAR | Status: DC | PRN

## 2020-11-10 MED ORDER — SODIUM CHLORIDE 0.9 % IV SOLN: INTRAVENOUS | Status: DC

## 2020-11-10 MED ORDER — ACETAMINOPHEN 325 MG PO TABS: 650.0000 mg | ORAL_TABLET | ORAL | Status: DC | PRN

## 2020-11-10 MED ORDER — HEPARIN SODIUM (PORCINE) 1000 UNIT/ML IJ SOLN
INTRAMUSCULAR | Status: AC
  Filled 2020-11-10: qty 1

## 2020-11-10 MED ORDER — FAMOTIDINE 20 MG PO TABS
ORAL_TABLET | ORAL | Status: AC
  Administered 2020-11-10: 40 mg via ORAL
  Filled 2020-11-10: qty 2

## 2020-11-10 MED ORDER — LABETALOL HCL 5 MG/ML IV SOLN: 10.0000 mg | INTRAVENOUS | Status: DC | PRN

## 2020-11-10 MED ORDER — SODIUM CHLORIDE 0.9% FLUSH: 3.0000 mL | Freq: Two times a day (BID) | INTRAVENOUS | Status: DC

## 2020-11-10 MED ORDER — ONDANSETRON HCL 4 MG/2ML IJ SOLN: 4.0000 mg | Freq: Four times a day (QID) | INTRAMUSCULAR | Status: DC | PRN

## 2020-11-10 MED ORDER — FENTANYL CITRATE (PF) 100 MCG/2ML IJ SOLN
INTRAMUSCULAR | Status: AC
  Filled 2020-11-10: qty 2

## 2020-11-10 MED ORDER — METHYLPREDNISOLONE SODIUM SUCC 125 MG IJ SOLR
INTRAMUSCULAR | Status: AC
  Administered 2020-11-10: 125 mg via INTRAVENOUS
  Filled 2020-11-10: qty 2

## 2020-11-10 MED ORDER — DIPHENHYDRAMINE HCL 50 MG/ML IJ SOLN
INTRAMUSCULAR | Status: AC
  Administered 2020-11-10: 50 mg via INTRAVENOUS
  Filled 2020-11-10: qty 1

## 2020-11-10 MED ORDER — CEFAZOLIN SODIUM-DEXTROSE 2-4 GM/100ML-% IV SOLN
INTRAVENOUS | Status: AC
  Administered 2020-11-10: 2 g via INTRAVENOUS
  Filled 2020-11-10: qty 100

## 2020-11-10 MED ORDER — IODIXANOL 320 MG/ML IV SOLN
INTRAVENOUS | Status: DC | PRN
  Administered 2020-11-10: 65 mL

## 2020-11-10 MED ORDER — MORPHINE SULFATE (PF) 4 MG/ML IV SOLN: 2.0000 mg | INTRAVENOUS | Status: DC | PRN

## 2020-11-10 MED ORDER — OXYCODONE HCL 5 MG PO TABS: 5.0000 mg | ORAL_TABLET | ORAL | Status: DC | PRN

## 2020-11-10 MED ORDER — MIDAZOLAM HCL 2 MG/ML PO SYRP: 8.0000 mg | ORAL_SOLUTION | Freq: Once | ORAL | Status: DC | PRN

## 2020-11-10 MED ORDER — METHYLPREDNISOLONE SODIUM SUCC 125 MG IJ SOLR: 125.0000 mg | Freq: Once | INTRAMUSCULAR | Status: AC | PRN

## 2020-11-10 MED ORDER — SODIUM CHLORIDE 0.9% FLUSH: 3.0000 mL | INTRAVENOUS | Status: DC | PRN

## 2020-11-10 MED ORDER — FENTANYL CITRATE (PF) 100 MCG/2ML IJ SOLN
INTRAMUSCULAR | Status: DC | PRN
  Administered 2020-11-10: 50 ug via INTRAVENOUS
  Administered 2020-11-10 (×2): 12.5 ug via INTRAVENOUS

## 2020-11-10 MED ORDER — HEPARIN SODIUM (PORCINE) 1000 UNIT/ML IJ SOLN
INTRAMUSCULAR | Status: DC | PRN
  Administered 2020-11-10: 1000 [IU] via INTRAVENOUS
  Administered 2020-11-10: 4000 [IU] via INTRAVENOUS

## 2020-11-10 MED ORDER — FAMOTIDINE 20 MG PO TABS: 40.0000 mg | ORAL_TABLET | Freq: Once | ORAL | Status: AC | PRN

## 2020-11-10 MED ORDER — SODIUM CHLORIDE 0.9 % IV SOLN: 250.0000 mL | INTRAVENOUS | Status: DC | PRN

## 2020-11-10 MED ORDER — CEFAZOLIN SODIUM-DEXTROSE 2-4 GM/100ML-% IV SOLN: 2.0000 g | Freq: Once | INTRAVENOUS | Status: AC

## 2020-11-10 MED ORDER — FENTANYL CITRATE (PF) 100 MCG/2ML IJ SOLN: 12.5000 ug | Freq: Once | INTRAMUSCULAR | Status: DC | PRN

## 2020-11-10 MED ORDER — DIPHENHYDRAMINE HCL 50 MG/ML IJ SOLN: 50.0000 mg | Freq: Once | INTRAMUSCULAR | Status: AC | PRN

## 2020-11-10 SURGICAL SUPPLY — 30 items
BALLN LUTONIX 018 4X300X130 (BALLOONS) ×2
BALLN LUTONIX 018 5X150X130 (BALLOONS) ×2
BALLN LUTONIX 018 5X300X130 (BALLOONS) ×2
BALLN ULTRSCOR 014 2.5X200X150 (BALLOONS) ×2
BALLOON LUTONIX 018 4X300X130 (BALLOONS) ×1 IMPLANT
BALLOON LUTONIX 018 5X150X130 (BALLOONS) ×1 IMPLANT
BALLOON LUTONIX 018 5X300X130 (BALLOONS) ×1 IMPLANT
BALLOON ULTRSC 014 2.5X200X150 (BALLOONS) ×1 IMPLANT
CATH BEACON 5 .035 40 KMP TP (CATHETERS) ×1 IMPLANT
CATH BEACON 5 .038 40 KMP TP (CATHETERS) ×1
CATH TEMPO 5F RIM 65CM (CATHETERS) ×2 IMPLANT
DEVICE RAD COMP TR BAND LRG (VASCULAR PRODUCTS) ×2 IMPLANT
DEVICE STARCLOSE SE CLOSURE (Vascular Products) ×2 IMPLANT
DEVICE TORQUE (MISCELLANEOUS) ×2 IMPLANT
DRAPE INCISE IOBAN 66X45 STRL (DRAPES) ×2 IMPLANT
GLIDECATH ANGLED 4FR 120CM (CATHETERS) ×2 IMPLANT
GLIDEWIRE STIFF .35X180X3 HYDR (WIRE) ×2 IMPLANT
GUIDEWIRE ADV .018X180CM (WIRE) ×2 IMPLANT
KIT ENCORE 26 ADVANTAGE (KITS) ×2 IMPLANT
KIT SNARE 25MM LOOP 120CM 6F (VASCULAR PRODUCTS) ×2 IMPLANT
NEEDLE ENTRY 21GA 7CM ECHOTIP (NEEDLE) ×2 IMPLANT
PACK ANGIOGRAPHY (CUSTOM PROCEDURE TRAY) ×2 IMPLANT
SET INTRO CAPELLA COAXIAL (SET/KITS/TRAYS/PACK) ×2 IMPLANT
SHEATH ANL2 6FRX45 HC (SHEATH) ×2 IMPLANT
SHEATH BRITE TIP 6FRX11 (SHEATH) ×2 IMPLANT
SHEATH HALO 035 5FRX10 (SHEATH) ×2 IMPLANT
SHEATH MICROPUNCTURE PEDAL 4FR (SHEATH) ×2 IMPLANT
WIRE G V18X300CM (WIRE) ×2 IMPLANT
WIRE GUIDERIGHT .035X150 (WIRE) ×2 IMPLANT
WIRE RUNTHROUGH .014X300CM (WIRE) ×2 IMPLANT

## 2020-11-10 NOTE — Interval H&P Note (Signed)
History and Physical Interval Note:  11/10/2020 8:19 AM  [name]  has presented today for surgery, with the diagnosis of Left LE angio w pedal approach  BARD  ASO w rest pain  Covid  Feb 18.  The various methods of treatment have been discussed with the patient and family. After consideration of risks, benefits and other options for treatment, the patient has consented to  Procedure(s): LOWER EXTREMITY ANGIOGRAPHY (Left) as a surgical intervention.  The patient's history has been reviewed, patient examined, no change in status, stable for surgery.  I have reviewed the patient's chart and labs.  Questions were answered to the patient's satisfaction.     Hortencia Pilar

## 2020-11-10 NOTE — Discharge Instructions (Signed)
Femoral Site Care  This sheet gives you information about how to care for yourself after your procedure. Your health care provider may also give you more specific instructions. If you have problems or questions, contact your health care provider. What can I expect after the procedure? After the procedure, it is common to have:  Bruising that usually fades within 1-2 weeks.  Tenderness at the site. Follow these instructions at home: Wound care  Follow instructions from your health care provider about how to take care of your insertion site. Make sure you: ? Wash your hands with soap and water before you change your bandage (dressing). If soap and water are not available, use hand sanitizer. ? Change your dressing as told by your health care provider. ? Leave stitches (sutures), skin glue, or adhesive strips in place. These skin closures may need to stay in place for 2 weeks or longer. If adhesive strip edges start to loosen and curl up, you may trim the loose edges. Do not remove adhesive strips completely unless your health care provider tells you to do that.  Do not take baths, swim, or use a hot tub until your health care provider approves.  You may shower 24-48 hours after the procedure or as told by your health care provider. ? Gently wash the site with plain soap and water. ? Pat the area dry with a clean towel. ? Do not rub the site. This may cause bleeding.  Do not apply powder or lotion to the site. Keep the site clean and dry.  Check your femoral site every day for signs of infection. Check for: ? Redness, swelling, or pain. ? Fluid or blood. ? Warmth. ? Pus or a bad smell. Activity  For the first 2-3 days after your procedure, or as long as directed: ? Avoid climbing stairs as much as possible. ? Do not squat.  Do not lift anything that is heavier than 10 lb (4.5 kg), or the limit that you are told, until your health care provider says that it is safe.  Rest as  directed. ? Avoid sitting for a long time without moving. Get up to take short walks every 1-2 hours.  Do not drive for 24 hours if you were given a medicine to help you relax (sedative). General instructions  Take over-the-counter and prescription medicines only as told by your health care provider.  Keep all follow-up visits as told by your health care provider. This is important. Contact a health care provider if you have:  A fever or chills.  You have redness, swelling, or pain around your insertion site. Get help right away if:  The catheter insertion area swells very fast.  You pass out.  You suddenly start to sweat or your skin gets clammy.  The catheter insertion area is bleeding, and the bleeding does not stop when you hold steady pressure on the area.  The area near or just beyond the catheter insertion site becomes pale, cool, tingly, or numb. These symptoms may represent a serious problem that is an emergency. Do not wait to see if the symptoms will go away. Get medical help right away. Call your local emergency services (911 in the [place] Do not drive yourself to the hospital. Summary  After the procedure, it is common to have bruising that usually fades within 1-2 weeks.  Check your femoral site every day for signs of infection.  Do not lift anything that is heavier than 10 lb (4.5 kg), or   the limit that you are told, until your health care provider says that it is safe. This information is not intended to replace advice given to you by your health care provider. Make sure you discuss any questions you have with your health care provider. Document Revised: 05/08/2020 Document Reviewed: 05/08/2020 Elsevier Patient Education  2021 Elsevier Inc.  

## 2020-11-10 NOTE — Op Note (Signed)
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  Introduction catheter into left lower extremity 3rd order catheter placement  2. Contrast injection left lower extremity for distal runoff  3.  Ultrasound-guided access to the left posterior tibial artery and right common femoral artery             4.   Percutaneous transluminal angioplasty left superficial femoral and popliteal arteries to 5 mm with Lutonix drug-eluting balloon 4. Percutaneous transluminal angioplasty left posterior tibial artery and tibioperoneal trunk to 4 mm with Lutonix drug-eluting balloon  5. Star close closure right common femoral arteriotomy  Anesthesia: Conscious sedation was administered under my direct supervision by the interventional radiology RN. IV Versed plus fentanyl were utilized. Continuous ECG, pulse oximetry and blood pressure was monitored throughout the entire procedure.  Conscious sedation was for a total of 1 hour 33 minutes 57 seconds.  Sheath: 5 French halo sheath retrograde left posterior tibial artery; 6 [place] Ansell sheath retrograde right common femoral artery  Contrast: 65 cc  Fluoroscopy Time: 11.3 minutes  Indications: [name] presents with increasing pain of the left lower extremity.  Noninvasive studies demonstrate severe atherosclerotic occlusive disease this suggests the patient is having limb threatening ischemia. The risks and benefits are reviewed all questions answered patient agrees to proceed.  Procedure:[name] is a 80 y.o. y.o. male who was identified and appropriate procedural time out was performed. The patient was  then placed supine on the table and prepped and draped in the usual sterile fashion.   Ultrasound was placed in a sterile sleeve.  The left posterior tibial artery was identified by ultrasound.  1% lidocaine was infiltrated in the soft tissues.  A micropuncture needle was then used to access the left posterior tibial artery.  Image was recorded for the permanent record.  Microwire followed by a micro-sheath was then inserted.  A 0.018 wire and Kumpe catheter was then advanced in a retrograde fashion through the left posterior tibial and negotiated into the popliteal and hand-injection contrast was used to confirm true luminal positioning.  5000 units of heparin was then given and allowed to circulate for several minutes  Ultrasound was placed in the sterile sleeve and the right groin was evaluated the right common femoral artery was echolucent and pulsatile indicating patency. Image was recorded for the permanent record and under real-time visualization a microneedle was inserted into the common femoral artery followed by the microwire and then the micro-sheath. A J-wire was then advanced through the micro-sheath and a 5 [place] sheath was then inserted over a J-wire. J-wire was then advanced and a 5 French pigtail catheter was positioned at the level of T12.  AP projection of the aorta was then obtained.  Rim catheter was repositioned to above the bifurcation and a RAO view of the pelvis was obtained. Subsequently a rim catheter with the stiff angle Glidewire was used to cross the aortic bifurcation the catheter wire were advanced down into the left distal external iliac artery. Oblique view of the femoral bifurcation was then obtained and subsequently the wire was reintroduced and the pigtail catheter negotiated into the SFA representing third order catheter placement. Distal runoff was then performed.  An additional 1000 units of heparin was then given and allowed to circulate and a 6  [place] Raby  sheath was advanced up and over the bifurcation and positioned in the femoral artery  Angled glide cath and advantage wire were then negotiated down into the distal popliteal. Catheter was then advanced. Hand injection contrast demonstrated the tibial anatomy in detail.     A EZ snare was then introduced into the common femoral from the femoral sheath and the 0.018 wire captured and pulled extracorporeally.  Once enough wire had been fed a 0.018 angled glide catheter was then advanced in an antegrade direction down across the tibial disease and positioned with its tip in the distal left posterior tibial artery.  2-1/2 x 200 ultra score balloon was used to angioplasty the left posterior tibial.  The tibioperoneal trunk is occluded and the left posterior tibial demonstrates multiple tandem lesions of greater than 80% over the first 8 to 10 cm.  Each inflation was for 1 minute at 12 atm.  Next a 4 mm x 300 mm Lutonix drug-eluting balloon was advanced across the same place and this balloon was inflated to just 4 atm for a luminal dimension of 3.7 mm.  Inflation was for 1 minute follow-up imaging demonstrated excellent patency of the left posterior tibial and tibioperoneal trunk with less than 10% residual stenosis.  The detector was then repositioned and the SFA and popliteal was reimaged multiple greater than 80% stenoses are noted from the proximal SFA down through the entire length of the popliteal.  Essentially the entire SFA and popliteal are diseased.  Stenosis is noted in this area and after appropriate measurements are made a 4 mm Lutonix drug-eluting balloon and then 5 mm Lutonix balloons was used to angioplasty the superficial femoral and popliteal arteries. Inflations were to 12 atmospheres for 2 minutes. Follow-up imaging demonstrated patency with excellent result and less than 10% residual stenosis throughout the entire length. Distal runoff was then reassessed and noted to be widely patent.    After review of these images the sheath is pulled into the right external iliac oblique of the common femoral is obtained and a Star close device deployed. There no immediate Complications.  Findings:  The distal aorta itself has diffuse disease but no hemodynamically significant lesions. The common and external iliac arteries are widely patent bilaterally.  Previous angioplasty site remains widely patent.  The left common femoral is widely patent and the profunda femoris is diffusely diseased with multiple hemodynamically significant lesions..  The SFA does indeed have a significant stenosis throughout its course essentially as noted above the SFA popliteal demonstrate numerous greater than 80% lesions.  The distal popliteal demonstrates increasing disease and the trifurcation is heavily diseased with occlusion of the anterior tibial and peroneal.  The tibioperoneal trunk and posterior tibial demonstrate hemodynamically significant lesions the tibioperoneal trunk is an occlusion short segment and there are multiple greater than 80% lesions in the first 8 to 10 cm of the posterior tibial.  Distally it is widely patent.     Following angioplasty posterior tibial  now is in-line flow and looks quite nice with less than 10% residual stenosis as noted above. Angioplasty of the SFA and popliteal yields an excellent result with less than 10% residual stenosis.  Summary: Successful recanalization left lower extremity for limb salvage   Disposition: Patient was taken to the recovery room in stable condition having tolerated the procedure well.  Belenda Cruise Paulla Mcclaskey 11/10/2020,10:07 AM

## 2020-11-11 DIAGNOSIS — Z970 Presence of artificial eye: Secondary | ICD-10-CM | POA: Diagnosis not present

## 2020-11-11 DIAGNOSIS — H40061 Primary angle closure without glaucoma damage, right eye: Secondary | ICD-10-CM | POA: Diagnosis not present

## 2020-11-21 ENCOUNTER — Other Ambulatory Visit (INDEPENDENT_AMBULATORY_CARE_PROVIDER_SITE_OTHER): Payer: Self-pay | Admitting: Nurse Practitioner

## 2020-11-25 ENCOUNTER — Telehealth (INDEPENDENT_AMBULATORY_CARE_PROVIDER_SITE_OTHER): Payer: Self-pay

## 2020-11-25 NOTE — Telephone Encounter (Signed)
Patient had a left leg angio with Dr. Delana Meyer on 11/10/20. Patient states his left ankle is swelling and going up his leg, it was stated that he wore a tight ankle sock which made it worse. Patient states that he has very little pain, he is not wearing any compression nor is he elevating.  Please advise.

## 2020-11-25 NOTE — Telephone Encounter (Signed)
Spoke with the patient and gave him the recommendations from Fallon Brown NP. See notes below. 

## 2020-11-25 NOTE — Telephone Encounter (Signed)
This is common for patients after angiogram.  Now that your leg is getting blood flow that it wasn't getting before, the tissues swell some.  The ankle sock made is worse because you were blocking the flow for a little.  You should elevate your leg, preferably above heart level.  Knee high compression socks will also help the swelling.  Do not wear the tight ankle socks.  He needs a follow up visit.  Let's get him on in 2 weeks with an ABI...GS or me

## 2020-12-09 ENCOUNTER — Other Ambulatory Visit (INDEPENDENT_AMBULATORY_CARE_PROVIDER_SITE_OTHER): Payer: Self-pay | Admitting: Vascular Surgery

## 2020-12-09 DIAGNOSIS — Z9862 Peripheral vascular angioplasty status: Secondary | ICD-10-CM

## 2020-12-09 DIAGNOSIS — M5432 Sciatica, left side: Secondary | ICD-10-CM | POA: Diagnosis not present

## 2020-12-09 DIAGNOSIS — I739 Peripheral vascular disease, unspecified: Secondary | ICD-10-CM | POA: Diagnosis not present

## 2020-12-10 ENCOUNTER — Other Ambulatory Visit: Payer: Self-pay

## 2020-12-10 ENCOUNTER — Ambulatory Visit (INDEPENDENT_AMBULATORY_CARE_PROVIDER_SITE_OTHER): Payer: Medicare Other | Admitting: Nurse Practitioner

## 2020-12-10 ENCOUNTER — Ambulatory Visit (INDEPENDENT_AMBULATORY_CARE_PROVIDER_SITE_OTHER): Payer: Medicare Other

## 2020-12-10 ENCOUNTER — Encounter (INDEPENDENT_AMBULATORY_CARE_PROVIDER_SITE_OTHER): Payer: Self-pay | Admitting: Nurse Practitioner

## 2020-12-10 VITALS — BP 178/85 | HR 85 | Resp 16 | Ht 67.0 in | Wt 152.0 lb

## 2020-12-10 DIAGNOSIS — E785 Hyperlipidemia, unspecified: Secondary | ICD-10-CM

## 2020-12-10 DIAGNOSIS — M48062 Spinal stenosis, lumbar region with neurogenic claudication: Secondary | ICD-10-CM | POA: Diagnosis not present

## 2020-12-10 DIAGNOSIS — I70222 Atherosclerosis of native arteries of extremities with rest pain, left leg: Secondary | ICD-10-CM | POA: Diagnosis not present

## 2020-12-10 DIAGNOSIS — I10 Essential (primary) hypertension: Secondary | ICD-10-CM | POA: Diagnosis not present

## 2020-12-10 DIAGNOSIS — Z9862 Peripheral vascular angioplasty status: Secondary | ICD-10-CM | POA: Diagnosis not present

## 2020-12-10 DIAGNOSIS — Z720 Tobacco use: Secondary | ICD-10-CM | POA: Diagnosis not present

## 2020-12-10 NOTE — Progress Notes (Signed)
Subjective:    Patient ID: [name], male    DOB: 07-10-1941, 80 y.o.   MRN: [id number] Chief Complaint  Patient presents with  . Follow-up    ultrasound    [name] is a 80 year old male returns to the office for followup and review status post angiogram with intervention. The patient notes improvement in the lower extremity symptoms. No interval shortening of the patient's claudication distance or rest pain symptoms. Previous wounds have now healed.  No new ulcers or wounds have occurred since the last visit.  The patient however does continue with have issues with ambulating.  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  There have been no significant changes to the patient's overall health care.  The patient denies amaurosis fugax or recent TIA symptoms. There are no recent neurological changes noted. The patient denies history of DVT, PE or superficial thrombophlebitis. The patient denies recent episodes of angina or shortness of breath.   ABI's Rt=0.99 and Lt=1.08  (previous ABI's Rt=1.15 and Lt=0.74) Duplex US of the left lower extremity she has biphasic tibial artery waveforms with the right showing monophasic/biphasic waveforms.  The patient has good left toe waveforms with slightly dampened right toe waveforms.   Review of Systems  Musculoskeletal: Positive for arthralgias and back pain.  All other systems reviewed and are negative.      Objective:   Physical Exam Vitals reviewed.  HENT:     Head: Normocephalic.  Cardiovascular:     Rate and Rhythm: Normal rate.     Pulses: Decreased pulses.  Pulmonary:     Effort: Pulmonary effort is normal.  Skin:    General: Skin is warm and dry.  Neurological:     Mental Status: He is alert and oriented to person, place, and time.     Motor: Weakness present.     Gait: Gait abnormal.  Psychiatric:        Mood and Affect: Mood normal.         Behavior: Behavior normal.        Thought Content: Thought content normal.        Judgment: Judgment normal.     BP (!) 178/85 (BP Location: Right Arm)   Pulse 85   Resp 16   Ht 5\' 7"  (1.702 m)   Wt 152 lb (68.9 kg)   BMI 23.81 kg/m   Past Medical History:  Diagnosis Date  . Atypical chest pain    a. 05/2011 Neg MV; b. 04/2013 Neg MV; c. 04/2019 Echo: EF 55-60%, mild BAE. Degen MV dzs. Nl RV fxn; d. 06/2019 MV: EF 55%, small, mild, fixed apical and apical ant defect - probable artifact. No ischemia.  Marland Kitchen COPD (chronic obstructive pulmonary disease) ([place])   . Coronary artery calcification seen on CT scan    a. 07/2018 CT Chest - cor Ca2+.  . Diabetes mellitus without complication ([place])   . Erosive gastropathy    a. 06/2019 EGD  . GERD (gastroesophageal reflux disease)   . Hypertension   . Pulmonary nodules    a. 07/2018 Chest CT: RML and RLL nodules.  Marland Kitchen TIA (transient ischemic attack)     Social History   Socioeconomic History  . Marital status: Widowed    Spouse name: Not on file  . Number of children: Not on file  . Years of education: Not on file  . Highest education level: Not on file  Occupational  History  . Not on file  Tobacco Use  . Smoking status: Current Every Day Smoker    Packs/day: 0.50    Years: 60.00    Pack years: 30.00  . Smokeless tobacco: Never Used  . Tobacco comment: still smoking 1/2 ppd.  Substance and Sexual Activity  . Alcohol use: Yes  . Drug use: No  . Sexual activity: Not on file  Other Topics Concern  . Not on file  Social History Narrative   Lives locally by himself.  Children live locally but work and he says he is mostly alone.  Cont to smoke up to 1/2 ppd.  Does not routinely exercise.   Social Determinants of Health   Financial Resource Strain: Not on file  Food Insecurity: Not on file  Transportation Needs: Not on file  Physical Activity: Not on file  Stress: Not on file  Social Connections: Not on file  Intimate Partner  Violence: Not on file    Past Surgical History:  Procedure Laterality Date  . EYE SURGERY    . LOWER EXTREMITY ANGIOGRAPHY Left 09/29/2020   Procedure: LOWER EXTREMITY ANGIOGRAPHY;  Surgeon: Katha Cabal, MD;  Location: [facility];  Service: Cardiovascular;  Laterality: Left;  . LOWER EXTREMITY ANGIOGRAPHY Left 11/10/2020   Procedure: LOWER EXTREMITY ANGIOGRAPHY;  Surgeon: Katha Cabal, MD;  Location: [place] [facility];  Service: Cardiovascular;  Laterality: Left;    Family History  Problem Relation Age of Onset  . Diabetes Mellitus II Mother   . Diabetes Mellitus II Father   . Diabetes Mellitus II Sister   . Diabetes Mellitus II Brother     Allergies  Allergen Reactions  . Lisinopril Swelling    Lip and check swelling  . Aspirin     Held due to anemia and fall risk. (tolerates low dose aspirin)  . Codeine Other (See Comments)    Sweaty  . Ivp Dye [Iodinated Diagnostic Agents] Hives and Other (See Comments)    dizzy  . Percocet [Oxycodone-Acetaminophen] Hives and Nausea Only    CBC Latest Ref Rng & Units 04/18/2020 04/17/2020 04/16/2020  WBC 4.0 - 10.5 K/uL 9.5 6.7 6.9  Hemoglobin 13.0 - 17.0 g/dL 11.7(L) 11.4(L) 11.6(L)  Hematocrit 39.0 - 52.0 % 35.5(L) 35.0(L) 36.5(L)  Platelets 150 - 400 K/uL 174 174 185      CMP     Component Value Date/Time   NA 136 04/18/2020 0401   NA 133 (L) 10/16/2013 0508   K 4.1 04/18/2020 0401   K 4.2 10/16/2013 0508   CL 101 04/18/2020 0401   CL 101 10/16/2013 0508   CO2 26 04/18/2020 0401   CO2 24 10/16/2013 0508   GLUCOSE 181 (H) 04/18/2020 0401   GLUCOSE 133 (H) 10/16/2013 0508   BUN 23 11/10/2020 0747   BUN 21 (H) 10/16/2013 0508   CREATININE 0.88 11/10/2020 0747   CREATININE 1.43 (H) 10/16/2013 0508   CALCIUM 8.7 (L) 04/18/2020 0401   CALCIUM 8.3 (L) 10/16/2013 0508   PROT 6.8 09/01/2019 1931   PROT 7.1 04/25/2013 1837   ALBUMIN 3.6 09/01/2019 1931   ALBUMIN 3.7 04/25/2013 1837   AST 25 09/01/2019  1931   AST 18 04/25/2013 1837   ALT 15 09/01/2019 1931   ALT 12 04/25/2013 1837   ALKPHOS 68 09/01/2019 1931   ALKPHOS 78 04/25/2013 1837   BILITOT 0.4 09/01/2019 1931   BILITOT 0.5 04/25/2013 1837   GFRNONAA >60 11/10/2020 0747   GFRNONAA 49 (L)  10/16/2013 0508   GFRAA >60 04/18/2020 0401   GFRAA 57 (L) 10/16/2013 0508     VAS [place] ABI WITH/WO TBI  Result Date: 10/20/2020 LOWER EXTREMITY DOPPLER STUDY Indications: Peripheral artery disease, and left ankle pain.  Vascular Interventions: 09/29/2020: PTA Left EIA. Attempted Crosser atherectomy                         of the distal Left Popliteal Artery. Comparison Study: 09/16/2020 Performing Technologist: Almira Coaster RVS  Examination Guidelines: A complete evaluation includes at minimum, Doppler waveform signals and systolic blood pressure reading at the level of bilateral brachial, anterior tibial, and posterior tibial arteries, when vessel segments are accessible. Bilateral testing is considered an integral part of a complete examination. Photoelectric Plethysmograph (PPG) waveforms and toe systolic pressure readings are included as required and additional duplex testing as needed. Limited examinations for reoccurring indications may be performed as noted.  ABI Findings: +---------+------------------+-----+---------+--------+ Right    Rt Pressure (mmHg)IndexWaveform Comment  +---------+------------------+-----+---------+--------+ Brachial 136                                      +---------+------------------+-----+---------+--------+ ATA      97                0.71 biphasic          +---------+------------------+-----+---------+--------+ PTA      157               1.15 triphasic         +---------+------------------+-----+---------+--------+ Great Toe107               0.78                   +---------+------------------+-----+---------+--------+ +---------+------------------+-----+----------+-------+ Left     Lt  Pressure (mmHg)IndexWaveform  Comment +---------+------------------+-----+----------+-------+ Brachial 137                                      +---------+------------------+-----+----------+-------+ PTA      102               0.74 monophasic        +---------+------------------+-----+----------+-------+ PERO     83                0.61 monophasic        +---------+------------------+-----+----------+-------+ Great Toe62                0.45 Normal            +---------+------------------+-----+----------+-------+ +-------+-----------+-----------+------------+------------+ ABI/TBIToday's ABIToday's TBIPrevious ABIPrevious TBI +-------+-----------+-----------+------------+------------+ Right  1.15       .78        1.21        .62          +-------+-----------+-----------+------------+------------+ Left   .74        .45        .76         .58          +-------+-----------+-----------+------------+------------+ Bilateral ABIs appear essentially unchanged compared to prior study on 09/16/2020. Right TBIs appear increased compared to prior study on 09/16/2020. Left TBIs appear decreased compared to prior study on 09/16/2020.  Summary: Right: Resting right ankle-brachial index is within normal range. No evidence of significant right lower extremity arterial disease. The right toe-brachial  index is normal. Left: Resting left ankle-brachial index indicates moderate left lower extremity arterial disease. The left toe-brachial index is abnormal.  *See table(s) above for measurements and observations.  Electronically signed by Leotis Pain MD on 10/20/2020 at 11:51:48 AM.    Final        Assessment & Plan:   1. 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  The patient reports that the claudication symptoms and leg pain is essentially gone.   The patient denies lifestyle limiting  changes at this point in time.  No further invasive studies, angiography or surgery at this time The patient should continue walking and begin a more formal exercise program.  The patient should continue antiplatelet therapy and aggressive treatment of the lipid abnormalities  Smoking cessation was again discussed  The patient should continue wearing graduated compression socks 10-15 mmHg strength to control the mild edema.  Patient should undergo noninvasive studies as ordered. The patient will follow up with me after the studies.    2. Primary hypertension Continue antihypertensive medications as already ordered, these medications have been reviewed and there are no changes at this time.   3. Spinal stenosis of lumbar region with neurogenic claudication This is the likely cause of the patient's continued lower extremity leg pain.  The patient's arterial studies today indicate improvement following intervention.  Patient also has a history of sciatica.  Patient is advised to follow-up with PCP in regards to lower extremity pain.  4. Hyperlipidemia, unspecified hyperlipidemia type Continue statin as ordered and reviewed, no changes at this time   5. Tobacco abuse Smoking cessation was discussed, 3-10 minutes spent on this topic specifically    Current Outpatient Medications on File Prior to Visit  Medication Sig Dispense Refill  . albuterol (VENTOLIN HFA) 108 (90 Base) MCG/ACT inhaler Inhale 2 puffs into the lungs every 6 (six) hours as needed for shortness of breath or wheezing.    Marland Kitchen amLODipine (NORVASC) 10 MG tablet Take 1 tablet (10 mg total) by mouth daily. 30 tablet 0  . aspirin 81 MG chewable tablet Chew 81 mg by mouth daily.    Marland Kitchen atorvastatin (LIPITOR) 40 MG tablet Take 40 mg by mouth daily.    . budesonide-formoterol (SYMBICORT) 160-4.5 MCG/ACT inhaler Inhale 2 puffs into the lungs daily.    . clopidogrel (PLAVIX) 75 MG tablet Take 1 tablet (75 mg total) by mouth daily.  30 tablet 4  . ferrous sulfate 325 (65 FE) MG tablet Take 325 mg by mouth daily with breakfast.    . gabapentin (NEURONTIN) 300 MG capsule Take 300 mg by mouth at bedtime.    . lidocaine (LIDODERM) 5 % Place 1 patch onto the skin daily as needed (Pain).    Marland Kitchen lidocaine (XYLOCAINE) 5 % ointment APPLY 1 APPLICATION TOPICALLY AS NEEDED. 35.44 g 2  . metFORMIN (GLUCOPHAGE) 850 MG tablet Take 850 mg by mouth daily with breakfast.     . metoprolol succinate (TOPROL-XL) 25 MG 24 hr tablet Take 12.5 mg by mouth daily.    . mirtazapine (REMERON) 15 MG tablet Take 15 mg by mouth at bedtime.    . ondansetron (ZOFRAN ODT) 4 MG disintegrating tablet Take 1 tablet (4 mg total) by mouth every 8 (eight) hours as needed for nausea or vomiting. 20 tablet 0  . OneTouch Delica Lancets 08Q MISC Apply topically.    . pantoprazole (PROTONIX) 40 MG  tablet Take 40 mg by mouth daily.    . magnesium oxide (MAG-OX) 400 MG tablet Take 400 mg by mouth 2 (two) times daily. (Patient not taking: No sig reported)     No current facility-administered medications on file prior to visit.    There are no Patient Instructions on file for this visit. No follow-ups on file.   Kris Hartmann, NP

## 2020-12-11 ENCOUNTER — Encounter (INDEPENDENT_AMBULATORY_CARE_PROVIDER_SITE_OTHER): Payer: Self-pay | Admitting: Nurse Practitioner

## 2020-12-11 DIAGNOSIS — F17200 Nicotine dependence, unspecified, uncomplicated: Secondary | ICD-10-CM | POA: Diagnosis not present

## 2020-12-11 DIAGNOSIS — N183 Chronic kidney disease, stage 3 unspecified: Secondary | ICD-10-CM | POA: Diagnosis not present

## 2020-12-11 DIAGNOSIS — K219 Gastro-esophageal reflux disease without esophagitis: Secondary | ICD-10-CM | POA: Diagnosis not present

## 2020-12-11 DIAGNOSIS — I10 Essential (primary) hypertension: Secondary | ICD-10-CM | POA: Diagnosis not present

## 2020-12-11 DIAGNOSIS — I129 Hypertensive chronic kidney disease with stage 1 through stage 4 chronic kidney disease, or unspecified chronic kidney disease: Secondary | ICD-10-CM | POA: Diagnosis not present

## 2020-12-11 DIAGNOSIS — M48061 Spinal stenosis, lumbar region without neurogenic claudication: Secondary | ICD-10-CM | POA: Diagnosis not present

## 2020-12-11 DIAGNOSIS — R634 Abnormal weight loss: Secondary | ICD-10-CM | POA: Diagnosis not present

## 2020-12-11 DIAGNOSIS — Z7289 Other problems related to lifestyle: Secondary | ICD-10-CM | POA: Diagnosis not present

## 2020-12-11 DIAGNOSIS — J449 Chronic obstructive pulmonary disease, unspecified: Secondary | ICD-10-CM | POA: Diagnosis not present

## 2020-12-11 DIAGNOSIS — Z1159 Encounter for screening for other viral diseases: Secondary | ICD-10-CM | POA: Diagnosis not present

## 2020-12-11 DIAGNOSIS — I70222 Atherosclerosis of native arteries of extremities with rest pain, left leg: Secondary | ICD-10-CM | POA: Diagnosis not present

## 2020-12-11 DIAGNOSIS — E1122 Type 2 diabetes mellitus with diabetic chronic kidney disease: Secondary | ICD-10-CM | POA: Diagnosis not present

## 2020-12-11 DIAGNOSIS — R110 Nausea: Secondary | ICD-10-CM | POA: Diagnosis not present

## 2020-12-21 DIAGNOSIS — M25552 Pain in left hip: Secondary | ICD-10-CM | POA: Diagnosis not present

## 2020-12-21 DIAGNOSIS — M48061 Spinal stenosis, lumbar region without neurogenic claudication: Secondary | ICD-10-CM | POA: Diagnosis not present

## 2020-12-21 DIAGNOSIS — G8929 Other chronic pain: Secondary | ICD-10-CM | POA: Diagnosis not present

## 2020-12-21 DIAGNOSIS — M545 Low back pain, unspecified: Secondary | ICD-10-CM | POA: Diagnosis not present

## 2020-12-23 DIAGNOSIS — M5440 Lumbago with sciatica, unspecified side: Secondary | ICD-10-CM | POA: Diagnosis not present

## 2020-12-23 DIAGNOSIS — M48061 Spinal stenosis, lumbar region without neurogenic claudication: Secondary | ICD-10-CM | POA: Diagnosis not present

## 2020-12-23 DIAGNOSIS — M545 Low back pain, unspecified: Secondary | ICD-10-CM | POA: Diagnosis not present

## 2020-12-23 DIAGNOSIS — G8929 Other chronic pain: Secondary | ICD-10-CM | POA: Diagnosis not present

## 2021-02-26 ENCOUNTER — Other Ambulatory Visit (INDEPENDENT_AMBULATORY_CARE_PROVIDER_SITE_OTHER): Payer: Self-pay | Admitting: Vascular Surgery

## 2021-03-16 ENCOUNTER — Other Ambulatory Visit (INDEPENDENT_AMBULATORY_CARE_PROVIDER_SITE_OTHER): Payer: Self-pay | Admitting: Vascular Surgery

## 2021-03-16 DIAGNOSIS — Z9862 Peripheral vascular angioplasty status: Secondary | ICD-10-CM

## 2021-03-18 ENCOUNTER — Other Ambulatory Visit: Payer: Self-pay

## 2021-03-18 ENCOUNTER — Ambulatory Visit (INDEPENDENT_AMBULATORY_CARE_PROVIDER_SITE_OTHER): Payer: Medicare Other | Admitting: Nurse Practitioner

## 2021-03-18 ENCOUNTER — Ambulatory Visit (INDEPENDENT_AMBULATORY_CARE_PROVIDER_SITE_OTHER): Payer: Medicare Other

## 2021-03-18 ENCOUNTER — Encounter (INDEPENDENT_AMBULATORY_CARE_PROVIDER_SITE_OTHER): Payer: Self-pay | Admitting: Nurse Practitioner

## 2021-03-18 VITALS — BP 166/94 | HR 83 | Resp 16 | Wt 152.0 lb

## 2021-03-18 DIAGNOSIS — I70222 Atherosclerosis of native arteries of extremities with rest pain, left leg: Secondary | ICD-10-CM | POA: Diagnosis not present

## 2021-03-18 DIAGNOSIS — Z9862 Peripheral vascular angioplasty status: Secondary | ICD-10-CM

## 2021-03-18 DIAGNOSIS — M48062 Spinal stenosis, lumbar region with neurogenic claudication: Secondary | ICD-10-CM | POA: Diagnosis not present

## 2021-03-18 DIAGNOSIS — E785 Hyperlipidemia, unspecified: Secondary | ICD-10-CM

## 2021-03-18 DIAGNOSIS — I10 Essential (primary) hypertension: Secondary | ICD-10-CM

## 2021-04-01 ENCOUNTER — Encounter (INDEPENDENT_AMBULATORY_CARE_PROVIDER_SITE_OTHER): Payer: Self-pay | Admitting: Nurse Practitioner

## 2021-04-02 NOTE — Progress Notes (Signed)
Subjective:    Patient ID: [name], male    DOB: 1941-09-11, 80 y.o.   MRN: [id number] Chief Complaint  Patient presents with   Follow-up    Ultrasound follow up    [name] is a 80 year old male returns to the office for followup and review status post angiogram with intervention. The patient notes improvement in the lower extremity symptoms. No interval shortening of the patient's claudication distance or rest pain symptoms. Previous wounds have now healed.  No new ulcers or wounds have occurred since the last visit.   The patient however does continue with have issues with ambulating.  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   There have been no significant changes to the patient's overall health care.   The patient denies amaurosis fugax or recent TIA symptoms. There are no recent neurological changes noted. The patient denies history of DVT, PE or superficial thrombophlebitis. The patient denies recent episodes of angina or shortness of breath.   ABI's Rt=1.13 and Lt=1.17  (previous ABI's Rt=0.99 and Lt=1.08) Duplex US of the left lower extremity reveals biphasic triphasic waveforms throughout with mostly biphasic waveforms at the tibial arteries.  Right tibial arteries are also biphasic.  The bilateral toe waveforms are normal   Review of Systems  Musculoskeletal:  Positive for back pain and gait problem.  All other systems reviewed and are negative.     Objective:   Physical Exam Vitals reviewed.  HENT:     Head: Normocephalic.  Cardiovascular:     Rate and Rhythm: Normal rate.     Pulses: Normal pulses.  Pulmonary:     Effort: Pulmonary effort is normal.  Neurological:     Mental Status: He is alert and oriented to person, place, and time.     Motor: Weakness present.     Gait: Gait abnormal.  Psychiatric:        Mood and Affect: Mood normal.        Behavior: Behavior normal.         Thought Content: Thought content normal.        Judgment: Judgment normal.    BP (!) 166/94 (BP Location: Left Arm)   Pulse 83   Resp 16   Wt 152 lb (68.9 kg)   BMI 23.81 kg/m   Past Medical History:  Diagnosis Date   Atypical chest pain    a. 05/2011 Neg MV; b. 04/2013 Neg MV; c. 04/2019 Echo: EF 55-60%, mild BAE. Degen MV dzs. Nl RV fxn; d. 06/2019 MV: EF 55%, small, mild, fixed apical and apical ant defect - probable artifact. No ischemia.   COPD (chronic obstructive pulmonary disease) (HCC)    Coronary artery calcification seen on CT scan    a. 07/2018 CT Chest - cor Ca2+.   Diabetes mellitus without complication (HCC)    Erosive gastropathy    a. 06/2019 EGD   GERD (gastroesophageal reflux disease)    Hypertension    Pulmonary nodules    a. 07/2018 Chest CT: RML and RLL nodules.   TIA (transient ischemic attack)     Social History   Socioeconomic History   Marital status: Widowed    Spouse name: Not on file   Number of children: Not on file   Years of education: Not on file   Highest education level: Not on file  Occupational History   Not on file  Tobacco Use   Smoking  status: Every Day    Packs/day: 0.50    Years: 60.00    Pack years: 30.00    Types: Cigarettes   Smokeless tobacco: Never   Tobacco comments:    still smoking 1/2 ppd.  Substance and Sexual Activity   Alcohol use: Yes   Drug use: No   Sexual activity: Not on file  Other Topics Concern   Not on file  Social History Narrative   Lives locally by himself.  Children live locally but work and he says he is mostly alone.  Cont to smoke up to 1/2 ppd.  Does not routinely exercise.   Social Determinants of Health   Financial Resource Strain: Not on file  Food Insecurity: Not on file  Transportation Needs: Not on file  Physical Activity: Not on file  Stress: Not on file  Social Connections: Not on file  Intimate Partner Violence: Not on file    Past Surgical History:  Procedure  Laterality Date   EYE SURGERY     LOWER EXTREMITY ANGIOGRAPHY Left 09/29/2020   Procedure: LOWER EXTREMITY ANGIOGRAPHY;  Surgeon: Katha Cabal, MD;  Location: [facility];  Service: Cardiovascular;  Laterality: Left;   LOWER EXTREMITY ANGIOGRAPHY Left 11/10/2020   Procedure: LOWER EXTREMITY ANGIOGRAPHY;  Surgeon: Katha Cabal, MD;  Location: [facility];  Service: Cardiovascular;  Laterality: Left;    Family History  Problem Relation Age of Onset   Diabetes Mellitus II Mother    Diabetes Mellitus II Father    Diabetes Mellitus II Sister    Diabetes Mellitus II Brother     Allergies  Allergen Reactions   Lisinopril Swelling    Lip and check swelling   Aspirin     Held due to anemia and fall risk. (tolerates low dose aspirin)   Codeine Other (See Comments)    Sweaty   Ivp Dye [Iodinated Diagnostic Agents] Hives and Other (See Comments)    dizzy   Percocet [Oxycodone-Acetaminophen] Hives and Nausea Only    CBC Latest Ref Rng & Units 04/18/2020 04/17/2020 04/16/2020  WBC 4.0 - 10.5 K/uL 9.5 6.7 6.9  Hemoglobin 13.0 - 17.0 g/dL 11.7(L) 11.4(L) 11.6(L)  Hematocrit 39.0 - 52.0 % 35.5(L) 35.0(L) 36.5(L)  Platelets 150 - 400 K/uL 174 174 185      CMP     Component Value Date/Time   NA 136 04/18/2020 0401   NA 133 (L) 10/16/2013 0508   K 4.1 04/18/2020 0401   K 4.2 10/16/2013 0508   CL 101 04/18/2020 0401   CL 101 10/16/2013 0508   CO2 26 04/18/2020 0401   CO2 24 10/16/2013 0508   GLUCOSE 181 (H) 04/18/2020 0401   GLUCOSE 133 (H) 10/16/2013 0508   BUN 23 11/10/2020 0747   BUN 21 (H) 10/16/2013 0508   CREATININE 0.88 11/10/2020 0747   CREATININE 1.43 (H) 10/16/2013 0508   CALCIUM 8.7 (L) 04/18/2020 0401   CALCIUM 8.3 (L) 10/16/2013 0508   PROT 6.8 09/01/2019 1931   PROT 7.1 04/25/2013 1837   ALBUMIN 3.6 09/01/2019 1931   ALBUMIN 3.7 04/25/2013 1837   AST 25 09/01/2019 1931   AST 18 04/25/2013 1837   ALT 15 09/01/2019 1931   ALT 12  04/25/2013 1837   ALKPHOS 68 09/01/2019 1931   ALKPHOS 78 04/25/2013 1837   BILITOT 0.4 09/01/2019 1931   BILITOT 0.5 04/25/2013 1837   GFRNONAA >60 11/10/2020 0747   GFRNONAA 49 (L) 10/16/2013 0508   GFRAA >60 04/18/2020  0401   GFRAA 57 (L) 10/16/2013 0508     ABI WITH/WO TBI  Result Date: 03/29/2021  LOWER EXTREMITY DOPPLER STUDY Patient Name:  BERND Rouser  Date of Exam:   03/18/2021 Medical Rec #: [id number]     Accession #:    [id number] Date of Birth: [birth year]     Patient Gender: M Patient Age:   79Y Exam Location:  [facility] Procedure:      VAS [place] ABI WITH/WO TBI Referring Phys: [id number] Stanton --------------------------------------------------------------------------------   Comparison Study: 12/10/2020 Performing Technologist: Charlane Ferretti RT (R)(VS)  Examination Guidelines: A complete evaluation includes at minimum, Doppler waveform signals and systolic blood pressure reading at the level of bilateral brachial, anterior tibial, and posterior tibial arteries, when vessel segments are accessible. Bilateral testing is considered an integral part of a complete examination. Photoelectric Plethysmograph (PPG) waveforms and toe systolic pressure readings are included as required and additional duplex testing as needed. Limited examinations for reoccurring indications may be performed as noted.  ABI Findings: +---------+------------------+-----+--------+--------+ Right    Rt Pressure (mmHg)IndexWaveformComment  +---------+------------------+-----+--------+--------+ Brachial 150                                     +---------+------------------+-----+--------+--------+ ATA      179               1.13 biphasic         +---------+------------------+-----+--------+--------+ PTA      176               1.11 biphasic         +---------+------------------+-----+--------+--------+ Great Toe92                0.58 Abnormal          +---------+------------------+-----+--------+--------+ +---------+------------------+-----+--------+-------+ Left     Lt Pressure (mmHg)IndexWaveformComment +---------+------------------+-----+--------+-------+ Brachial 158                                    +---------+------------------+-----+--------+-------+ ATA      183               1.16 biphasic        +---------+------------------+-----+--------+-------+ PTA      185               1.17 biphasic        +---------+------------------+-----+--------+-------+ Great Toe115               0.73 Normal          +---------+------------------+-----+--------+-------+ +-------+-----------+-----------+------------+------------+ ABI/TBIToday's ABIToday's TBIPrevious ABIPrevious TBI +-------+-----------+-----------+------------+------------+ Right  1.13       .58        .99         .75          +-------+-----------+-----------+------------+------------+ Left   1.17       .73        1.08        .75          +-------+-----------+-----------+------------+------------+ Bilateral ABIs appear essentially unchanged compared to prior study on 12/10/2020.  Summary: Right: Resting right ankle-brachial index is within normal range. No evidence of significant right lower extremity arterial disease. The right toe-brachial index is abnormal. Right TBI appears decreased as compared to the previous exam on 12/10/2020. Left: Resting left ankle-brachial index is within normal range. No evidence  of significant left lower extremity arterial disease. The left toe-brachial index is normal. Left TBI appears essentially unchanged as compared to the previous exam on 12/10/2020.  *See table(s) above for measurements and observations.  Electronically signed by Hortencia Pilar MD on 03/29/2021 at 1:12:42 PM.    Final        Assessment & Plan:   1. 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  The patient does not voice lifestyle limiting changes at this point in time.  Noninvasive studies do not suggest clinically significant change.  No invasive studies, angiography or surgery at this time The patient should continue walking and begin a more formal exercise program.  The patient should continue antiplatelet therapy and aggressive treatment of the lipid abnormalities  No changes in the patient's medications at this time  The patient should continue wearing graduated compression socks 10-15 mmHg strength to control the mild edema.    2. Primary hypertension Continue antihypertensive medications as already ordered, these medications have been reviewed and there are no changes at this time.   3. Spinal stenosis of lumbar region with neurogenic claudication Based on the patient's noninvasive studies today his symptoms were more likely related to his spinal stenosis with neurogenic claudication.  Patient does not seem to want from orthopedic surgery.  We will refer the patient for further work-up evaluation of related pain. - Ambulatory referral to Orthopedic Surgery  4. Hyperlipidemia, unspecified hyperlipidemia type Continue statin as ordered and reviewed, no changes at this time    Current Outpatient Medications on File Prior to Visit  Medication Sig Dispense Refill   albuterol (VENTOLIN HFA) 108 (90 Base) MCG/ACT inhaler Inhale 2 puffs into the lungs every 6 (six) hours as needed for shortness of breath or wheezing.     amLODipine (NORVASC) 10 MG tablet Take 1 tablet (10 mg total) by mouth daily. 30 tablet 0   aspirin 81 MG chewable tablet Chew 81 mg by mouth daily.     atorvastatin (LIPITOR) 40 MG tablet Take 40 mg by mouth daily.     budesonide-formoterol (SYMBICORT) 160-4.5 MCG/ACT inhaler Inhale 2 puffs into the lungs daily.     clopidogrel (PLAVIX) 75 MG tablet TAKE 1 TABLET BY MOUTH EVERY DAY 90 tablet 1    ferrous sulfate 325 (65 FE) MG tablet Take 325 mg by mouth daily with breakfast.     gabapentin (NEURONTIN) 300 MG capsule Take 300 mg by mouth at bedtime.     lidocaine (LIDODERM) 5 % Place 1 patch onto the skin daily as needed (Pain).     lidocaine (XYLOCAINE) 5 % ointment APPLY 1 APPLICATION TOPICALLY AS NEEDED. 35.44 g 2   metFORMIN (GLUCOPHAGE) 850 MG tablet Take 850 mg by mouth daily with breakfast.      metoprolol succinate (TOPROL-XL) 25 MG 24 hr tablet Take 12.5 mg by mouth daily.     mirtazapine (REMERON) 15 MG tablet Take 15 mg by mouth at bedtime.     ondansetron (ZOFRAN ODT) 4 MG disintegrating tablet Take 1 tablet (4 mg total) by mouth every 8 (eight) hours as needed for nausea or vomiting. 20 tablet 0   OneTouch Delica Lancets 93Y MISC Apply topically.     pantoprazole (PROTONIX) 40 MG tablet Take 40 mg by mouth daily.     magnesium oxide (MAG-OX) 400 MG tablet Take 400 mg by mouth 2 (two) times  daily. (Patient not taking: No sig reported)     No current facility-administered medications on file prior to visit.    There are no Patient Instructions on file for this visit. No follow-ups on file.   Kris Hartmann, NP

## 2021-04-15 DIAGNOSIS — E1122 Type 2 diabetes mellitus with diabetic chronic kidney disease: Secondary | ICD-10-CM | POA: Insufficient documentation

## 2021-04-15 DIAGNOSIS — Z7982 Long term (current) use of aspirin: Secondary | ICD-10-CM | POA: Diagnosis not present

## 2021-04-15 DIAGNOSIS — Z7951 Long term (current) use of inhaled steroids: Secondary | ICD-10-CM | POA: Diagnosis not present

## 2021-04-15 DIAGNOSIS — F17210 Nicotine dependence, cigarettes, uncomplicated: Secondary | ICD-10-CM | POA: Diagnosis not present

## 2021-04-15 DIAGNOSIS — Z7984 Long term (current) use of oral hypoglycemic drugs: Secondary | ICD-10-CM | POA: Insufficient documentation

## 2021-04-15 DIAGNOSIS — J441 Chronic obstructive pulmonary disease with (acute) exacerbation: Secondary | ICD-10-CM | POA: Insufficient documentation

## 2021-04-15 DIAGNOSIS — M25552 Pain in left hip: Secondary | ICD-10-CM | POA: Insufficient documentation

## 2021-04-15 DIAGNOSIS — I2510 Atherosclerotic heart disease of native coronary artery without angina pectoris: Secondary | ICD-10-CM | POA: Insufficient documentation

## 2021-04-15 DIAGNOSIS — R079 Chest pain, unspecified: Secondary | ICD-10-CM | POA: Insufficient documentation

## 2021-04-15 DIAGNOSIS — Z7902 Long term (current) use of antithrombotics/antiplatelets: Secondary | ICD-10-CM | POA: Diagnosis not present

## 2021-04-15 DIAGNOSIS — I129 Hypertensive chronic kidney disease with stage 1 through stage 4 chronic kidney disease, or unspecified chronic kidney disease: Secondary | ICD-10-CM | POA: Diagnosis not present

## 2021-04-15 DIAGNOSIS — R61 Generalized hyperhidrosis: Secondary | ICD-10-CM | POA: Insufficient documentation

## 2021-04-15 DIAGNOSIS — N182 Chronic kidney disease, stage 2 (mild): Secondary | ICD-10-CM | POA: Diagnosis not present

## 2021-04-15 DIAGNOSIS — Z79899 Other long term (current) drug therapy: Secondary | ICD-10-CM | POA: Insufficient documentation

## 2021-04-15 DIAGNOSIS — Z8673 Personal history of transient ischemic attack (TIA), and cerebral infarction without residual deficits: Secondary | ICD-10-CM | POA: Insufficient documentation

## 2021-04-16 ENCOUNTER — Emergency Department
Admission: EM | Admit: 2021-04-16 | Discharge: 2021-04-16 | Disposition: A | Discharge status: Home / Self Care | Payer: Medicare Other | Attending: Emergency Medicine | Admitting: Emergency Medicine

## 2021-04-16 ENCOUNTER — Other Ambulatory Visit: Payer: Self-pay

## 2021-04-16 ENCOUNTER — Emergency Department: Payer: Medicare Other

## 2021-04-16 DIAGNOSIS — R52 Pain, unspecified: Secondary | ICD-10-CM

## 2021-04-16 DIAGNOSIS — M79605 Pain in left leg: Secondary | ICD-10-CM

## 2021-04-16 DIAGNOSIS — M25552 Pain in left hip: Secondary | ICD-10-CM | POA: Diagnosis not present

## 2021-04-16 MED ORDER — LIDOCAINE 5 % EX PTCH
1.0000 | MEDICATED_PATCH | CUTANEOUS | Status: DC
  Administered 2021-04-16: 1 via TRANSDERMAL
  Filled 2021-04-16: qty 1

## 2021-04-16 MED ORDER — KETOROLAC TROMETHAMINE 30 MG/ML IJ SOLN
15.0000 mg | Freq: Once | INTRAMUSCULAR | Status: AC
  Administered 2021-04-16: 15 mg via INTRAMUSCULAR
  Filled 2021-04-16: qty 1

## 2021-04-16 NOTE — ED Provider Notes (Signed)
[facility] Emergency Department Provider Note  ____________________________________________   Event Date/Time   First MD Initiated Contact with Patient 04/16/21 [phone]     (approximate)  I have reviewed the triage vital signs and the nursing notes.   HISTORY  Chief Complaint Hip Pain    HPI [name] is a 80 y.o. male who presents for evaluation of a cute onset pain in his left hip.  He said it has been going on for a couple of hours.  He has had no recent fall.  He said it hurts to move his leg or try to bear weight.  Nothing in particular makes it better except getting in certain positions.  No swelling.  No numbness or tingling down his extremities.  He said that he has had problem with that leg in the past but this is more severe.  He says that he was wearing a Lidoderm patch when he arrived but it fell off when he was in the waiting room.  During my interview (RN Lovette Cliche present at bedside as well), the patient said that when he first arrived he was sweaty and that he was having chest pain that he thinks was because he took too much pain medicine for his hip.  However although symptoms resolved and he just wants something for his hip.  He currently denies chest pain, shortness of breath, nausea, vomiting, and abdominal pain.     Past Medical History:  Diagnosis Date   Atypical chest pain    a. 05/2011 Neg MV; b. 04/2013 Neg MV; c. 04/2019 Echo: EF 55-60%, mild BAE. Degen MV dzs. Nl RV fxn; d. 06/2019 MV: EF 55%, small, mild, fixed apical and apical ant defect - probable artifact. No ischemia.   COPD (chronic obstructive pulmonary disease) (HCC)    Coronary artery calcification seen on CT scan    a. 07/2018 CT Chest - cor Ca2+.   Diabetes mellitus without complication (HCC)    Erosive gastropathy    a. 06/2019 EGD   GERD (gastroesophageal reflux disease)    Hypertension    Pulmonary nodules    a. 07/2018 Chest CT: RML and RLL nodules.   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   Poor fluid intake 06/01/2020   Sciatica of left side 06/01/2020   Weight loss, abnormal 06/01/2020   Dizziness 04/16/2020   Iron deficiency anemia 04/16/2020   Anxiety 03/31/2020   Tobacco abuse 03/24/2020   NSTEMI (non-ST elevated myocardial infarction) ([place]) 03/23/2020   Chest pain 03/22/2020   HLD (hyperlipidemia) 03/22/2020   COPD (chronic obstructive pulmonary disease) ([place])    Diabetes mellitus without complication (HCC)    GERD (gastroesophageal reflux disease)    Hypertension    TIA (transient ischemic attack)    Elevated troponin    CAD (coronary artery disease)    Early satiety 02/21/2020   Grief reaction 09/08/2019   Adjustment reaction with anxiety and depression 09/02/2019   Chest pain with high risk of acute coronary syndrome 08/29/2019   Frequent PVCs 05/03/2019   Stressful life event affecting family 01/31/2019   Abnormal screening CT of chest 08/11/2018   Spinal stenosis of lumbar region 05/19/2018   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   Ptosis, left eyelid 06/28/2017   DDD (degenerative disc disease), lumbar 06/09/2016   Near syncope 05/28/2016   COPD exacerbation ([place]) 05/27/2016   Benign prostatic hyperplasia with nocturia 11/23/2014   CKD stage 2 due to type 2 diabetes mellitus ([place]) 11/14/2014   Bilateral carotid artery stenosis 07/01/2014   Chronic kidney disease 05/05/2014   Orthostatic hypotension 05/05/2014   Solitary pulmonary nodule 03/03/2014   Hypertrophy of prostate with urinary obstruction and other lower urinary tract symptoms (LUTS) 02/13/2014   Presbyopia 01/18/2012   Subclinical hyperthyroidism 01/16/2007    Thyrotoxicosis 01/16/2007    Past Surgical History:  Procedure Laterality Date   EYE SURGERY     LOWER EXTREMITY ANGIOGRAPHY Left 09/29/2020   Procedure: LOWER EXTREMITY ANGIOGRAPHY;  Surgeon: Katha Cabal, MD;  Location: [facility];  Service: Cardiovascular;  Laterality: Left;   LOWER EXTREMITY ANGIOGRAPHY Left 11/10/2020   Procedure: LOWER EXTREMITY ANGIOGRAPHY;  Surgeon: Katha Cabal, MD;  Location: [facility];  Service: Cardiovascular;  Laterality: Left;    Prior to Admission medications   Medication Sig Start Date End Date Taking? Authorizing Provider  albuterol (VENTOLIN HFA) 108 (90 Base) MCG/ACT inhaler Inhale 2 puffs into the lungs every 6 (six) hours as needed for shortness of breath or wheezing. 12/07/15   [provider]  amLODipine (NORVASC) 10 MG tablet Take 1 tablet (10 mg total) by mouth daily. 05/30/16   Hower, Aaron Mose, MD  aspirin 81 MG chewable tablet Chew 81 mg by mouth daily. 06/03/20   [provider]  atorvastatin (LIPITOR) 40 MG tablet Take 40 mg by mouth daily. 01/21/20   [provider]  budesonide-formoterol (SYMBICORT) 160-4.5 MCG/ACT inhaler Inhale 2 puffs into the lungs daily.    [provider]  clopidogrel (PLAVIX) 75 MG tablet TAKE 1 TABLET BY MOUTH EVERY DAY 02/26/21   Schnier, Dolores Lory, MD  ferrous sulfate 325 (65 FE) MG tablet Take 325 mg by mouth daily with breakfast. 02/11/20   [provider]  gabapentin (NEURONTIN) 300 MG capsule Take 300 mg by mouth at bedtime. 08/06/19   [provider]  lidocaine (LIDODERM) 5 % Place 1 patch onto the skin daily as needed (Pain). 06/30/20   [provider]  lidocaine (XYLOCAINE) 5 % ointment APPLY 1 APPLICATION TOPICALLY AS NEEDED. 11/23/20   Kris Hartmann, NP  magnesium oxide (MAG-OX) 400 MG tablet Take 400 mg by mouth 2 (two) times daily. Patient not taking: No sig reported 09/01/20   [provider]  metFORMIN  (GLUCOPHAGE) 850 MG tablet Take 850 mg by mouth daily with breakfast.     [provider]  metoprolol succinate (TOPROL-XL) 25 MG 24 hr tablet Take 12.5 mg by mouth daily. 08/02/19   [provider]  mirtazapine (REMERON) 15 MG tablet Take 15 mg by mouth at bedtime.    [provider]  ondansetron (ZOFRAN ODT) 4 MG disintegrating tablet Take 1 tablet (4 mg total) by mouth every 8 (eight) hours as needed for nausea or vomiting. 01/07/20   Carrie Mew, MD  OneTouch Delica Lancets [id number] MISC Apply topically. 05/26/20   [provider]  pantoprazole (PROTONIX) 40 MG tablet Take 40 mg by mouth daily. 01/15/20   [provider]    Allergies Lisinopril, Aspirin, Codeine, Ivp dye [iodinated diagnostic agents], and Percocet [oxycodone-acetaminophen]  Family History  Problem Relation Age of Onset   Diabetes Mellitus II Mother    Diabetes Mellitus II Father  Diabetes Mellitus II Sister    Diabetes Mellitus II Brother     Social History Social History   Tobacco Use   Smoking status: Every Day    Packs/day: 0.50    Years: 60.00    Pack years: 30.00    Types: Cigarettes   Smokeless tobacco: Never   Tobacco comments:    still smoking 1/2 ppd.  Substance Use Topics   Alcohol use: Yes   Drug use: No    Review of Systems Constitutional: No fever/chills Cardiovascular: Possible chest pain earlier, now resolved. Respiratory: Denies shortness of breath. Gastrointestinal: No abdominal pain.  No nausea, no vomiting.   Musculoskeletal: Acute on chronic left hip pain.  No other extremity issues. Integumentary: Negative for rash. Neurological: Negative for headaches, focal weakness or numbness.   ____________________________________________   PHYSICAL EXAM:  VITAL SIGNS: ED Triage Vitals  Enc Vitals Group     BP 04/16/21 0011 138/72     Pulse Rate 04/16/21 0011 77     Resp 04/16/21 0011 16     Temp 04/16/21 0011 97.7 F (36.5 C)     Temp  Source 04/16/21 0011 Oral     SpO2 04/16/21 0011 97 %     Weight 04/16/21 0012 68.5 kg (151 lb)     Height --      Head Circumference --      Peak Flow --      Pain Score 04/16/21 0012 10     Pain Loc --      Pain Edu? --      Excl. in [place]? --     Constitutional: Alert and oriented.  Eyes: Conjunctivae are normal.  Head: Atraumatic. Nose: No congestion/rhinnorhea. Mouth/Throat: Patient is wearing a mask. Neck: No stridor.  No meningeal signs.   Cardiovascular: Normal rate, regular rhythm. Good peripheral circulation. Respiratory: Normal respiratory effort.  No retractions. Musculoskeletal: Patient reports pain and tenderness with range of motion of the left lower extremity but it is better with distraction when I passively range his leg with flexion, extension, abduction, and abduction of the hip.  No obvious swelling nor bruising, and the compartments are soft and easily compressible with no palpable deformities.  The patient's leg is well-perfused with no evidence of arterial compromise.  The area is very specific to his left hip and does not extend distally. Neurologic:  Normal speech and language. No gross focal neurologic deficits are appreciated.  Skin:  Skin is warm, dry and intact. Psychiatric: Mood and affect are irritated, understandable after a lengthy wait in the emergency department waiting room due to overwhelming patient volume and limited staffing.  ____________________________________________    RADIOLOGY I, Hinda Kehr, personally viewed and evaluated these images (plain radiographs) as part of my medical decision making, as well as reviewing the written report by the radiologist.  ED MD interpretation: No acute abnormalities  Official radiology report(s): DG FEMUR MIN 2 VIEWS LEFT  Result Date: 04/16/2021 CLINICAL DATA:  Atraumatic left hip pain. EXAM: LEFT FEMUR 2 VIEWS COMPARISON:  None. FINDINGS: There is no evidence of fracture or other focal bone lesions.  There is moderate severity vascular calcification. IMPRESSION: No acute osseous abnormality. Electronically Signed   By: Virgina Norfolk M.D.   On: 04/16/2021 00:57    ____________________________________________   INITIAL IMPRESSION / MDM / ASSESSMENT AND PLAN / ED COURSE  As part of my medical decision making, I reviewed the following data within the [place] notes reviewed and  incorporated, Labs reviewed , Old chart reviewed, Radiograph reviewed , Notes from prior ED visits, and [place] Controlled Substance Database   Differential diagnosis includes, but is not limited to, acute on chronic musculoskeletal pain, hematoma, peripheral vascular disease, arterial occlusion, phlegmasia or other DVT, acute infection.  Patient has been stable throughout his 5 hours in the emergency department and unfortunately he had to wait a long time for assessment due to overwhelming ED volume and limited staffing.  He is understandably disgruntled at the wait.  The significance of the symptoms he disclosed to his nurse and me during my H&P with him is unclear; however, I offered on 2 separate occasions during the same interview to perform a full evaluation with lab work and EKG since he was complaining of diaphoresis and chest pain.  However he is refusing any additional work-up.  He says that he just wants a shot of something to make his leg feel better and he will go.    I reviewed his medical record and see that he has been going to physical therapy for chronic left hip pain and chronic low back pain on the left side.  He is also had a vascular procedure in the past and has significant chronic medical issues.  However it does not appear he has an emergent medical condition at this time.   I Asked him if anything in the past has helped with this type of pain and he said that 1 time the doctor gave him an injection of something that was over-the-counter and he thought was 350 mg by injection of  the 200 mg over-the-counter.  I am not certain to what he is referring and he cannot remember the name, but it does not sound like opioids.  Additionally I would be concerned about giving opioids to an opioid nave 80 year old who already has ambulatory difficulties.  I have verified in the controlled substance database that he has no controlled substance prescriptions.  I am giving Toradol 30 mg intramuscular after verifying that his most recent labs demonstrated normal creatinine.  I am also providing a new Lidoderm patch.  The patient will follow up with his regular doctor.  I gave my usual and customary return precautions.         ____________________________________________  FINAL CLINICAL IMPRESSION(S) / ED DIAGNOSES  Final diagnoses:  Leg pain, lateral, left     MEDICATIONS GIVEN DURING THIS VISIT:  Medications  ketorolac (TORADOL) 30 MG/ML injection 15 mg (has no administration in time range)  lidocaine (LIDODERM) 5 % 1 patch (has no administration in time range)     ED Discharge Orders     None        Note:  This document was prepared using Dragon voice recognition software and may include unintentional dictation errors.   Hinda Kehr, MD 04/16/21 [phone]

## 2021-04-16 NOTE — ED Triage Notes (Signed)
Pt presents to ER via [facility] from home with c/o left hip and leg pain x a few hrs.  Pt states he did not fall or have any injury to left leg.  No deformity noted to leg at this time.  Pt A&O x4 at this time.

## 2021-04-16 NOTE — ED Notes (Signed)
Urinal given to pt at this time.

## 2021-04-16 NOTE — Discharge Instructions (Addendum)
As we discussed, your x-rays were reassuring.  This is likely an acute exacerbation of your chronic left hip pain.  However, please remember that you declined additional evaluation.  We treated your pain with an injection of Toradol 30 mg intramuscular as well as a Lidoderm patch.  Please continue to use the medication you have at home and follow-up with your regular doctor at the next available opportunity.  Return to the emergency department if you develop new or worsening symptoms that concern you.

## 2021-04-16 NOTE — ED Notes (Addendum)
ED Provider at bedside accompanied with this RN.

## 2021-09-01 ENCOUNTER — Other Ambulatory Visit (INDEPENDENT_AMBULATORY_CARE_PROVIDER_SITE_OTHER): Payer: Self-pay | Admitting: Nurse Practitioner

## 2021-09-01 DIAGNOSIS — Z9889 Other specified postprocedural states: Secondary | ICD-10-CM

## 2021-09-01 DIAGNOSIS — I739 Peripheral vascular disease, unspecified: Secondary | ICD-10-CM

## 2021-09-02 ENCOUNTER — Encounter (INDEPENDENT_AMBULATORY_CARE_PROVIDER_SITE_OTHER): Payer: Self-pay | Admitting: Vascular Surgery

## 2021-09-02 ENCOUNTER — Ambulatory Visit (INDEPENDENT_AMBULATORY_CARE_PROVIDER_SITE_OTHER): Payer: Medicare Other | Admitting: Vascular Surgery

## 2021-09-02 ENCOUNTER — Ambulatory Visit (INDEPENDENT_AMBULATORY_CARE_PROVIDER_SITE_OTHER): Payer: Medicare Other

## 2021-09-02 ENCOUNTER — Other Ambulatory Visit: Payer: Self-pay

## 2021-09-02 VITALS — BP 198/112 | HR 97 | Ht 67.0 in | Wt 154.0 lb

## 2021-09-02 DIAGNOSIS — M5136 Other intervertebral disc degeneration, lumbar region: Secondary | ICD-10-CM

## 2021-09-02 DIAGNOSIS — I10 Essential (primary) hypertension: Secondary | ICD-10-CM

## 2021-09-02 DIAGNOSIS — Z9889 Other specified postprocedural states: Secondary | ICD-10-CM | POA: Diagnosis not present

## 2021-09-02 DIAGNOSIS — I70213 Atherosclerosis of native arteries of extremities with intermittent claudication, bilateral legs: Secondary | ICD-10-CM | POA: Diagnosis not present

## 2021-09-02 DIAGNOSIS — I739 Peripheral vascular disease, unspecified: Secondary | ICD-10-CM

## 2021-09-02 DIAGNOSIS — I6523 Occlusion and stenosis of bilateral carotid arteries: Secondary | ICD-10-CM

## 2021-09-02 DIAGNOSIS — I25118 Atherosclerotic heart disease of native coronary artery with other forms of angina pectoris: Secondary | ICD-10-CM

## 2021-09-02 DIAGNOSIS — I70219 Atherosclerosis of native arteries of extremities with intermittent claudication, unspecified extremity: Secondary | ICD-10-CM | POA: Insufficient documentation

## 2021-09-02 DIAGNOSIS — K219 Gastro-esophageal reflux disease without esophagitis: Secondary | ICD-10-CM

## 2021-09-02 NOTE — Progress Notes (Signed)
MRN : [id number]  [name] is a 80 y.o. (Apr 30, 1941) male who presents with chief complaint of check legs.  History of Present Illness:   The patient returns to the office for followup and review status post angiogram with intervention 11/10/2020.   Procedure:  1.  Percutaneous transluminal angioplasty left superficial femoral and popliteal arteries to 5 mm with Lutonix drug-eluting balloon  2.  Percutaneous transluminal angioplasty left posterior tibial artery and tibioperoneal trunk to 4 mm with Lutonix drug-eluting balloon  The patient notes improvement in the lower extremity symptoms. No interval shortening of the patient's claudication distance or rest pain symptoms. Previous wounds have now healed.  No new ulcers or wounds have occurred since the last visit.  There have been no significant changes to the patient's overall health care.  The patient denies amaurosis fugax or recent TIA symptoms. There are no recent neurological changes noted. The patient denies history of DVT, PE or superficial thrombophlebitis. The patient denies recent episodes of angina or shortness of breath.   ABI's Rt=1.14 and Lt=1.28  (previous ABI's Rt=1.13 and Lt=1.17)   Current Meds  Medication Sig   albuterol (VENTOLIN HFA) 108 (90 Base) MCG/ACT inhaler Inhale 2 puffs into the lungs every 6 (six) hours as needed for shortness of breath or wheezing.   amLODipine (NORVASC) 10 MG tablet Take 1 tablet (10 mg total) by mouth daily.   aspirin 81 MG chewable tablet Chew 81 mg by mouth daily.   atorvastatin (LIPITOR) 40 MG tablet Take 40 mg by mouth daily.   budesonide-formoterol (SYMBICORT) 160-4.5 MCG/ACT inhaler Inhale 2 puffs into the lungs daily.   clopidogrel (PLAVIX) 75 MG tablet TAKE 1 TABLET BY MOUTH EVERY DAY   ferrous sulfate 325 (65 FE) MG tablet Take 325 mg by mouth daily with breakfast.   gabapentin (NEURONTIN) 300 MG capsule Take 300 mg by mouth at bedtime.   lidocaine (LIDODERM) 5 %  Place 1 patch onto the skin daily as needed (Pain).   lidocaine (XYLOCAINE) 5 % ointment APPLY 1 APPLICATION TOPICALLY AS NEEDED.   magnesium oxide (MAG-OX) 400 MG tablet Take 400 mg by mouth 2 (two) times daily.   metFORMIN (GLUCOPHAGE) 850 MG tablet Take 850 mg by mouth daily with breakfast.    metoprolol succinate (TOPROL-XL) 25 MG 24 hr tablet Take 12.5 mg by mouth daily.   mirtazapine (REMERON) 15 MG tablet Take 15 mg by mouth at bedtime.   ondansetron (ZOFRAN ODT) 4 MG disintegrating tablet Take 1 tablet (4 mg total) by mouth every 8 (eight) hours as needed for nausea or vomiting.   OneTouch Delica Lancets 24M MISC Apply topically.   pantoprazole (PROTONIX) 40 MG tablet Take 40 mg by mouth daily.    Past Medical History:  Diagnosis Date   Atypical chest pain    a. 05/2011 Neg MV; b. 04/2013 Neg MV; c. 04/2019 Echo: EF 55-60%, mild BAE. Degen MV dzs. Nl RV fxn; d. 06/2019 MV: EF 55%, small, mild, fixed apical and apical ant defect - probable artifact. No ischemia.   COPD (chronic obstructive pulmonary disease) (HCC)    Coronary artery calcification seen on CT scan    a. 07/2018 CT Chest - cor Ca2+.   Diabetes mellitus without complication (HCC)    Erosive gastropathy    a. 06/2019 EGD   GERD (gastroesophageal reflux disease)    Hypertension    Pulmonary nodules    a. 07/2018 Chest CT: RML and RLL nodules.   TIA (transient ischemic  attack)     Past Surgical History:  Procedure Laterality Date   EYE SURGERY     LOWER EXTREMITY ANGIOGRAPHY Left 09/29/2020   Procedure: LOWER EXTREMITY ANGIOGRAPHY;  Surgeon: Katha Cabal, MD;  Location: [facility];  Service: Cardiovascular;  Laterality: Left;   LOWER EXTREMITY ANGIOGRAPHY Left 11/10/2020   Procedure: LOWER EXTREMITY ANGIOGRAPHY;  Surgeon: Katha Cabal, MD;  Location: [facility];  Service: Cardiovascular;  Laterality: Left;    Social History Social History   Tobacco Use   Smoking status: Every Day     Packs/day: 0.50    Years: 60.00    Pack years: 30.00    Types: Cigarettes   Smokeless tobacco: Never   Tobacco comments:    still smoking 1/2 ppd.  Substance Use Topics   Alcohol use: Yes   Drug use: No    Family History Family History  Problem Relation Age of Onset   Diabetes Mellitus II Mother    Diabetes Mellitus II Father    Diabetes Mellitus II Sister    Diabetes Mellitus II Brother     Allergies  Allergen Reactions   Lisinopril Swelling    Lip and check swelling   Aspirin     Held due to anemia and fall risk. (tolerates low dose aspirin)   Codeine Other (See Comments)    Sweaty   Ivp Dye [Iodinated Diagnostic Agents] Hives and Other (See Comments)    dizzy   Percocet [Oxycodone-Acetaminophen] Hives and Nausea Only     REVIEW OF SYSTEMS (Negative unless checked)  Constitutional: [] Weight loss  [] Fever  [] Chills Cardiac: [] Chest pain   [] Chest pressure   [] Palpitations   [] Shortness of breath when laying flat   [] Shortness of breath with exertion. Vascular:  [] Pain in legs with walking   [x] Pain in legs at rest  [] History of DVT   [] Phlebitis   [] Swelling in legs   [] Varicose veins   [] Non-healing ulcers Pulmonary:   [] Uses home oxygen   [] Productive cough   [] Hemoptysis   [] Wheeze  [] COPD   [] Asthma Neurologic:  [] Dizziness   [] Seizures   [] History of stroke   [] History of TIA  [] Aphasia   [] Vissual changes   [] Weakness or numbness in arm   [] Weakness or numbness in leg Musculoskeletal:   [] Joint swelling   [] Joint pain   [x] Low back pain Hematologic:  [] Easy bruising  [] Easy bleeding   [] Hypercoagulable state   [] Anemic Gastrointestinal:  [] Diarrhea   [] Vomiting  [x] Gastroesophageal reflux/heartburn   [] Difficulty swallowing. Genitourinary:  [] Chronic kidney disease   [] Difficult urination  [] Frequent urination   [] Blood in urine Skin:  [] Rashes   [] Ulcers  Psychological:  [] History of anxiety   []  History of major depression.  Physical Examination  Vitals:    09/02/21 0913  BP: (!) 198/112  Pulse: 97  Weight: 154 lb (69.9 kg)  Height: 5\' 7"  (1.702 m)   Body mass index is 24.12 kg/m. Gen: WD/WN, NAD Head: [place]/AT, No temporalis wasting.  Ear/Nose/Throat: Hearing grossly intact, nares w/o erythema or drainage Eyes: PER, EOMI, sclera nonicteric.  Neck: Supple, no masses.  No bruit or JVD.  Pulmonary:  Good air movement, no audible wheezing, no use of accessory muscles.  Cardiac: RRR, normal S1, S2, no Murmurs. Vascular:   bilateral carotid bruits Vessel Right Left  Radial Palpable Palpable  Carotid Palpable Palpable  PT Palpable Palpable  DP Not Palpable Not Palpable  Gastrointestinal: soft, non-distended. No guarding/no peritoneal signs.  Musculoskeletal:  M/S 5/5 throughout.  No visible deformity.  Neurologic: CN 2-12 intact. Pain and light touch intact in extremities.  Symmetrical.  Speech is fluent. Motor exam as listed above. Psychiatric: Judgment intact, Mood & affect appropriate for pt's clinical situation. Dermatologic: No rashes or ulcers noted.  No changes consistent with cellulitis.   CBC Lab Results  Component Value Date   WBC 9.5 04/18/2020   HGB 11.7 (L) 04/18/2020   HCT 35.5 (L) 04/18/2020   MCV 75.7 (L) 04/18/2020   PLT 174 04/18/2020    BMET    Component Value Date/Time   NA 136 04/18/2020 0401   NA 133 (L) 10/16/2013 0508   K 4.1 04/18/2020 0401   K 4.2 10/16/2013 0508   CL 101 04/18/2020 0401   CL 101 10/16/2013 0508   CO2 26 04/18/2020 0401   CO2 24 10/16/2013 0508   GLUCOSE 181 (H) 04/18/2020 0401   GLUCOSE 133 (H) 10/16/2013 0508   BUN 23 11/10/2020 0747   BUN 21 (H) 10/16/2013 0508   CREATININE 0.88 11/10/2020 0747   CREATININE 1.43 (H) 10/16/2013 0508   CALCIUM 8.7 (L) 04/18/2020 0401   CALCIUM 8.3 (L) 10/16/2013 0508   GFRNONAA >60 11/10/2020 0747   GFRNONAA 49 (L) 10/16/2013 0508   GFRAA >60 04/18/2020 0401   GFRAA 57 (L) 10/16/2013 0508   CrCl cannot be calculated (Patient's most recent  lab result is older than the maximum 21 days allowed.).  COAG Lab Results  Component Value Date   INR 1.1 03/22/2020   INR 1.0 08/29/2019   INR 0.97 05/27/2016    Radiology No results found.   Assessment/Plan 1. Atherosclerosis of native artery of both lower extremities with intermittent claudication (HCC)  Recommend:  The patient has evidence of atherosclerosis of the lower extremities with claudication.  The patient does not voice lifestyle limiting changes at this point in time.  Noninvasive studies do not suggest clinically significant change.  No invasive studies, angiography or surgery at this time The patient should continue walking and begin a more formal exercise program.  The patient should continue antiplatelet therapy and aggressive treatment of the lipid abnormalities  No changes in the patient's medications at this time  The patient should continue wearing graduated compression socks 10-15 mmHg strength to control the mild edema.   - VAS [place] ABI WITH/WO TBI; Future  2. Bilateral carotid artery stenosis Recommend:  Given the patient's asymptomatic subcritical stenosis no further invasive testing or surgery at this time.  Duplex ultrasound shows <40% stenosis bilaterally.  Continue antiplatelet therapy as prescribed Continue management of CAD, HTN and Hyperlipidemia Healthy heart diet,  encouraged exercise at least 4 times per week Follow up in 6 months with duplex ultrasound and physical exam.    - VAS US CAROTID; Future  3. DDD (degenerative disc disease), lumbar The patient is scheduled for a epidural injection on September 10, 2021.  In review of his medications he is taking both aspirin and Plavix.  His intervention was in February approximately 10 months ago.  I have instructed the patient to stop his Plavix tomorrow and then restart his preop Plavix on September 11, 2021 1 day post epidural block.  4. Coronary artery disease of native artery of native  heart with stable angina pectoris (HCC) Continue cardiac and antihypertensive medications as already ordered and reviewed, no changes at this time.  Continue statin as ordered and reviewed, no changes at this time  Nitrates PRN for chest pain   5.  Primary hypertension Continue antihypertensive medications as already ordered, these medications have been reviewed and there are no changes at this time.   6. Gastroesophageal reflux disease without esophagitis Continue PPI as already ordered, this medication has been reviewed and there are no changes at this time.  Avoidence of caffeine and alcohol  Moderate elevation of the head of the bed      Hortencia Pilar, MD  09/02/2021 9:59 AM

## 2022-03-03 ENCOUNTER — Encounter (INDEPENDENT_AMBULATORY_CARE_PROVIDER_SITE_OTHER): Payer: Medicare Other

## 2022-03-03 ENCOUNTER — Ambulatory Visit (INDEPENDENT_AMBULATORY_CARE_PROVIDER_SITE_OTHER): Payer: Medicare Other | Admitting: Nurse Practitioner

## 2022-03-16 ENCOUNTER — Ambulatory Visit (INDEPENDENT_AMBULATORY_CARE_PROVIDER_SITE_OTHER): Payer: Medicare Other

## 2022-03-16 ENCOUNTER — Encounter (INDEPENDENT_AMBULATORY_CARE_PROVIDER_SITE_OTHER): Payer: Self-pay | Admitting: Nurse Practitioner

## 2022-03-16 ENCOUNTER — Ambulatory Visit (INDEPENDENT_AMBULATORY_CARE_PROVIDER_SITE_OTHER): Payer: Medicare Other | Admitting: Nurse Practitioner

## 2022-03-16 ENCOUNTER — Other Ambulatory Visit (INDEPENDENT_AMBULATORY_CARE_PROVIDER_SITE_OTHER): Payer: Self-pay | Admitting: Vascular Surgery

## 2022-03-16 VITALS — BP 133/69 | HR 91 | Resp 16 | Wt 160.0 lb

## 2022-03-16 DIAGNOSIS — I70213 Atherosclerosis of native arteries of extremities with intermittent claudication, bilateral legs: Secondary | ICD-10-CM | POA: Diagnosis not present

## 2022-03-16 DIAGNOSIS — I10 Essential (primary) hypertension: Secondary | ICD-10-CM | POA: Diagnosis not present

## 2022-03-16 DIAGNOSIS — Z720 Tobacco use: Secondary | ICD-10-CM | POA: Diagnosis not present

## 2022-03-16 DIAGNOSIS — E785 Hyperlipidemia, unspecified: Secondary | ICD-10-CM

## 2022-03-16 DIAGNOSIS — I739 Peripheral vascular disease, unspecified: Secondary | ICD-10-CM | POA: Diagnosis not present

## 2022-03-29 ENCOUNTER — Emergency Department
Admission: EM | Admit: 2022-03-29 | Discharge: 2022-03-29 | Disposition: A | Discharge status: Home / Self Care | Payer: Medicare Other | Attending: Emergency Medicine | Admitting: Emergency Medicine

## 2022-03-29 ENCOUNTER — Other Ambulatory Visit: Payer: Self-pay

## 2022-03-29 DIAGNOSIS — R42 Dizziness and giddiness: Secondary | ICD-10-CM | POA: Diagnosis present

## 2022-03-29 DIAGNOSIS — R7989 Other specified abnormal findings of blood chemistry: Secondary | ICD-10-CM | POA: Insufficient documentation

## 2022-03-29 DIAGNOSIS — K5900 Constipation, unspecified: Secondary | ICD-10-CM | POA: Diagnosis not present

## 2022-03-29 LAB — CBC
HCT: 36.7 % — ABNORMAL LOW (ref 39.0–52.0)
Hemoglobin: 11 g/dL — ABNORMAL LOW (ref 13.0–17.0)
MCH: 25.5 pg — ABNORMAL LOW (ref 26.0–34.0)
MCHC: 30 g/dL (ref 30.0–36.0)
MCV: 85 fL (ref 80.0–100.0)
Platelets: 171 10*3/uL (ref 150–400)
RBC: 4.32 MIL/uL (ref 4.22–5.81)
RDW: 14.4 % (ref 11.5–15.5)
WBC: 7.4 10*3/uL (ref 4.0–10.5)
nRBC: 0 % (ref 0.0–0.2)

## 2022-03-29 LAB — BASIC METABOLIC PANEL
Anion gap: 6 (ref 5–15)
BUN: 19 mg/dL (ref 8–23)
CO2: 26 mmol/L (ref 22–32)
Calcium: 8.6 mg/dL — ABNORMAL LOW (ref 8.9–10.3)
Chloride: 105 mmol/L (ref 98–111)
Creatinine, Ser: 1.27 mg/dL — ABNORMAL HIGH (ref 0.61–1.24)
GFR, Estimated: 57 mL/min — ABNORMAL LOW (ref >60)
Glucose, Bld: 166 mg/dL — ABNORMAL HIGH (ref 70–99)
Potassium: 3.7 mmol/L (ref 3.5–5.1)
Sodium: 137 mmol/L (ref 135–145)

## 2022-03-29 LAB — TROPONIN I (HIGH SENSITIVITY)
Troponin I (High Sensitivity): 54 ng/L — ABNORMAL HIGH (ref <18)
Troponin I (High Sensitivity): 57 ng/L — ABNORMAL HIGH (ref <18)

## 2022-03-29 MED ORDER — MAGNESIUM CITRATE PO SOLN: 1.0000 | Freq: Once | ORAL | 0 refills | Status: AC

## 2022-03-29 NOTE — Discharge Instructions (Signed)
Take the magnesium citrate and then start the miralax you have at home. Please seek medical attention for any high fevers, chest pain, shortness of breath, change in behavior, persistent vomiting, bloody stool or any other new or concerning symptoms.

## 2022-03-29 NOTE — ED Provider Notes (Signed)
[facility] Provider Note    Event Date/Time   First MD Initiated Contact with Patient 03/29/22 [phone]     (approximate)   History   Lightheadedness.   HPI  [name] is a 81 y.o. male  who presents to the emergency department today because of concern for an episode of lightheadedness.  The patient says that his lightheadedness started when he tried to give himself an enema.  He feels like he might pass out.  He was going to give himself an enema because he has not had a bowel movement in 3 days.  He has had issues with increased constipation over the past month.  This was his first time tried an enema in roughly 1 year.  When EMS arrived they found to be quite hypotensive.  They did administer IV fluids during transport.  At the time my exam patient states that he feels improved. Says he has history of hemorrhoids.       Physical Exam   Triage Vital Signs: ED Triage Vitals  Enc Vitals Group     BP 03/29/22 0931 (!) 152/84     Pulse Rate 03/29/22 0931 71     Resp 03/29/22 0931 18     Temp 03/29/22 0931 (!) 97.3 F (36.3 C)     Temp Source 03/29/22 0931 Oral     SpO2 03/29/22 0931 95 %     Weight 03/29/22 0932 160 lb 15 oz (73 kg)     Height 03/29/22 0932 '5\' 7"'$  (1.702 m)     Head Circumference --      Peak Flow --      Pain Score 03/29/22 0931 0     Pain Loc --      Pain Edu? --      Excl. in [place]? --     Most recent vital signs: Vitals:   03/29/22 0931  BP: (!) 152/84  Pulse: 71  Resp: 18  Temp: (!) 97.3 F (36.3 C)  SpO2: 95%   General: Awake, alert, oriented. CV:  Good peripheral perfusion. Regular rate and rhythm. Resp:  Normal effort. Lungs clear. Abd:  No distention. Non tender.   ED Results / Procedures / Treatments   Labs (all labs ordered are listed, but only abnormal results are displayed) Labs Reviewed  CBC - Abnormal; Notable for the following components:      Result Value   Hemoglobin 11.0 (*)    HCT 36.7 (*)     MCH 25.5 (*)    All other components within normal limits  BASIC METABOLIC PANEL     EKG  I, Nance Pear, attending physician, personally viewed and interpreted this EKG  EKG Time: 0932 Rate: 69 Rhythm: sinus rhythm Axis: normal Intervals: qtc 486 QRS: narrow, q waves v1, v2 ST changes: no st elevation Impression: abnormal ekg   RADIOLOGY None   PROCEDURES:  Critical Care performed: No  Procedures   MEDICATIONS ORDERED IN ED: Medications - No data to display   IMPRESSION / MDM / [place] / ED COURSE  I reviewed the triage vital signs and the nursing notes.                              Differential diagnosis includes, but is not limited to, ACS, anemia, vasovagal, arrythmia.  Patient's presentation is most consistent with acute presentation with potential threat to life or bodily function.  Patient presents  to the emergency department today because of concerns for an episode of lightheadedness.  Found to have low blood pressure by EMS.  This did start after the patient was attempting to give himself an enema for constipation.  The patient at the time my exam does feel better and blood pressure had improved.  Blood work did show a slight initial elevation of troponin however patient has baseline elevation of troponin.  I did repeat this and there was no significant change.  This time I have low suspicion for ACS.  Additionally blood work without any concerning anemia or arrhythmia.  FINAL CLINICAL IMPRESSION(S) / ED DIAGNOSES   Final diagnoses:  Constipation, unspecified constipation type  Lightheadedness     Note:  This document was prepared using Dragon voice recognition software and may include unintentional dictation errors.    Nance Pear, MD 03/29/22 1434

## 2022-03-29 NOTE — ED Notes (Signed)
Sig pad not working, pt verbalizes understanding of d/c instructions. Denies questions or concerns. D/c with caregiver

## 2022-03-29 NOTE — ED Notes (Signed)
Pt noted to have BM

## 2022-03-29 NOTE — ED Triage Notes (Signed)
Pt to ED [facility] from home for constipation x3 days. Pt attempted enema at home, ems reports seeing trace amounts of blood on toilet seat. Hypotensive 86/47 on arrival, 144/69 after 500 ml NS bolus. 18 g to right hand.

## 2022-04-02 ENCOUNTER — Encounter (INDEPENDENT_AMBULATORY_CARE_PROVIDER_SITE_OTHER): Payer: Self-pay | Admitting: Nurse Practitioner

## 2022-04-02 IMAGING — CR DG CHEST 2V
1 series · 3 of 3 positions shown · non-contrast
Comparison: 03/22/2020

CLINICAL DATA: Shortness of breath, dizziness

EXAM:
CHEST - 2 VIEW

[Series 1: w chest pa · 0.14mm/px · 3 of 3 slices shown]
[im 1/3]
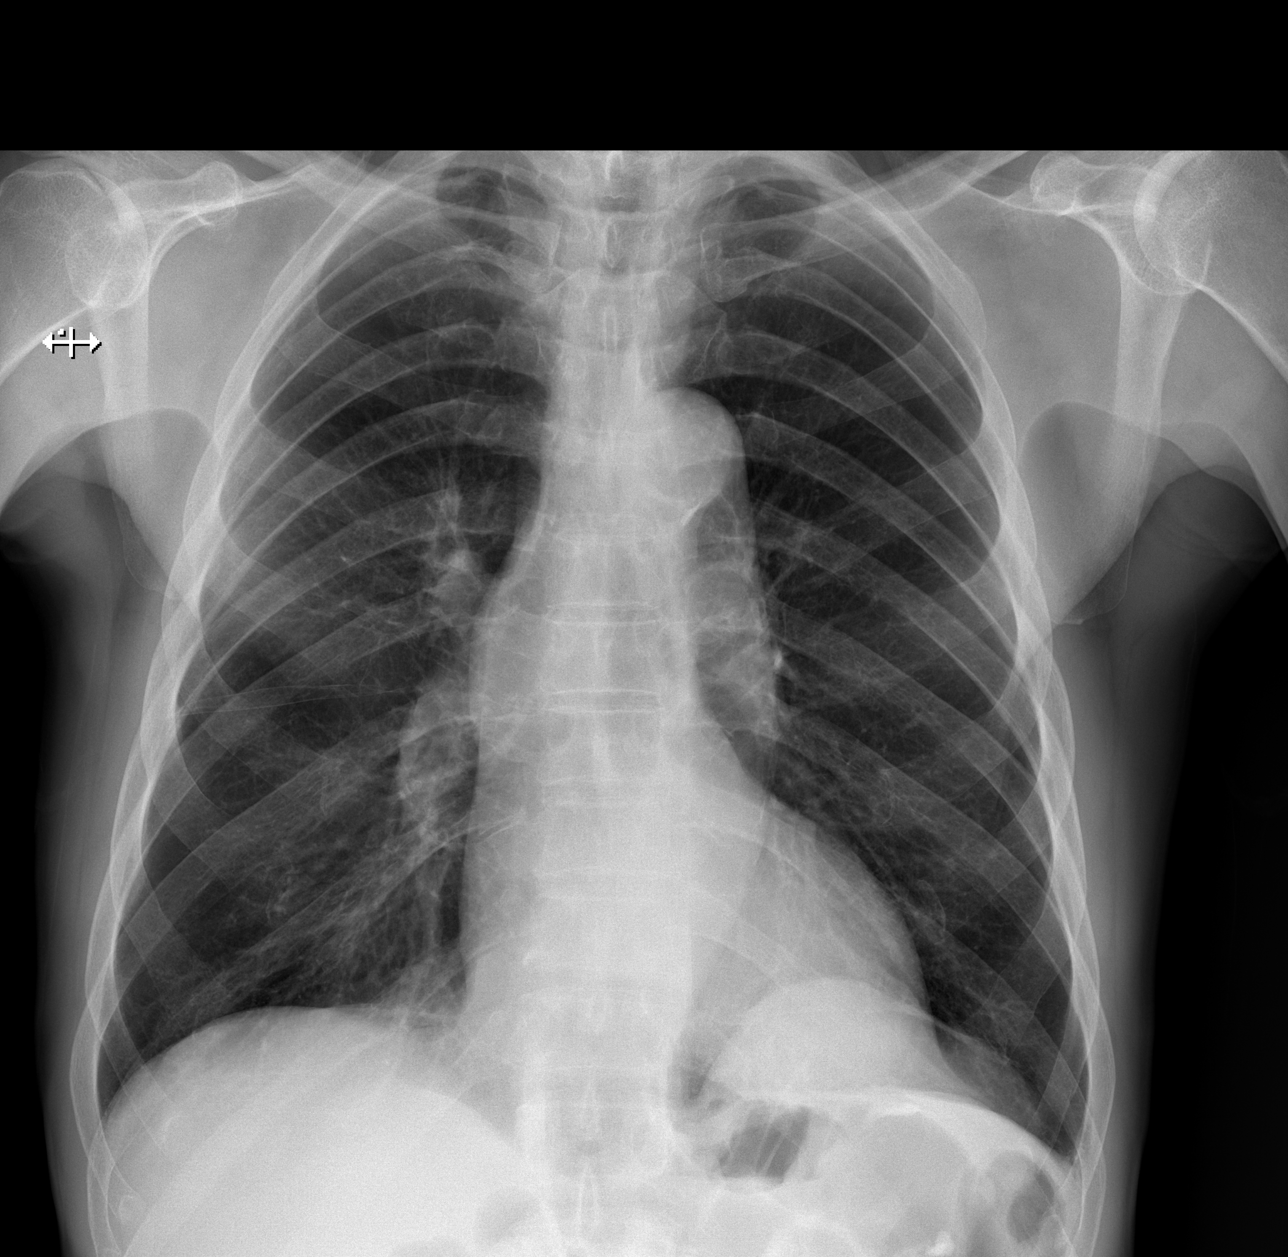
[im 2/3]
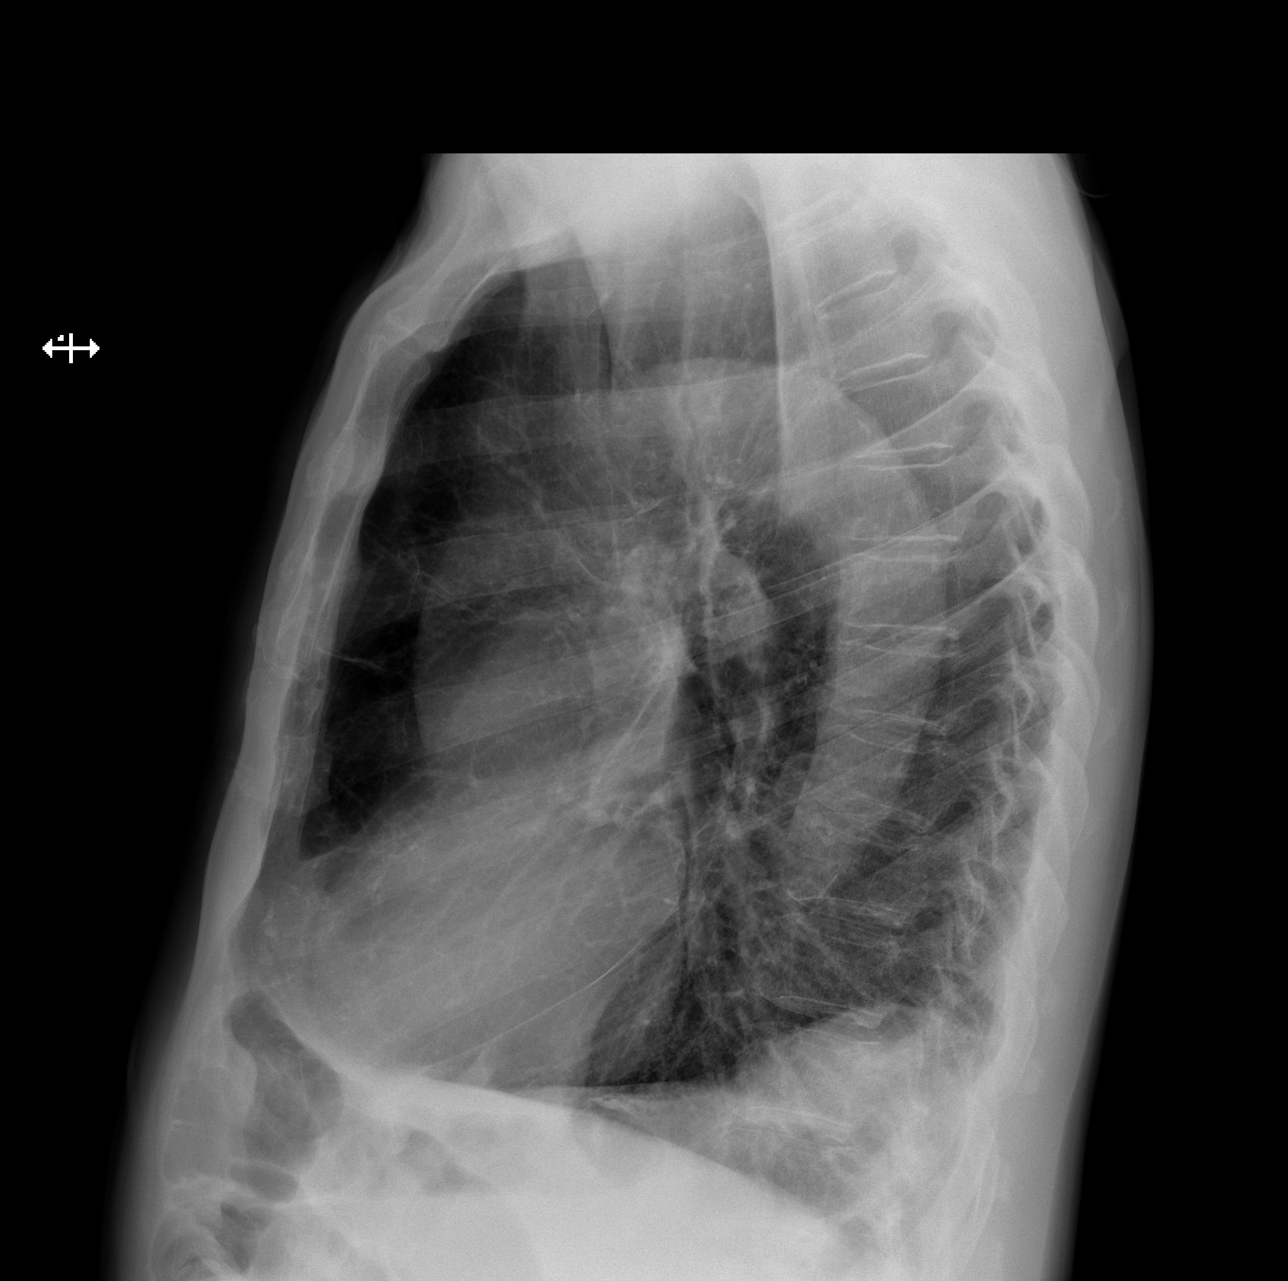
[im 3/3]
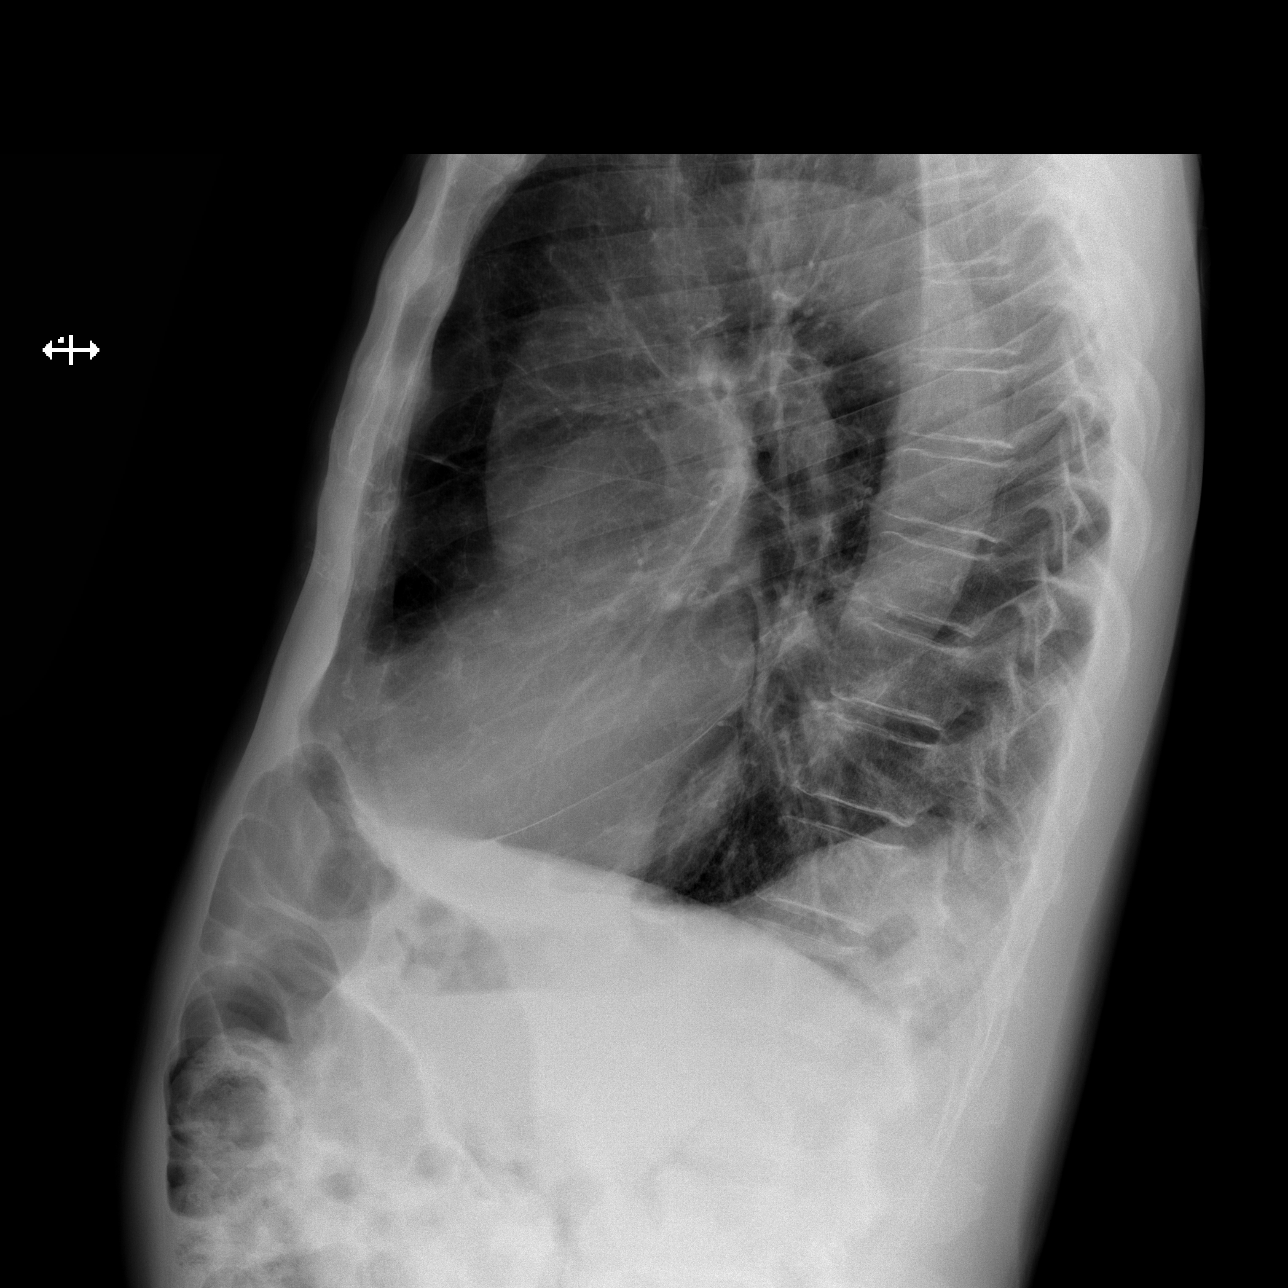

[3 of 3 positions shown; findings below may reference images not displayed]

FINDINGS: Posterior left diaphragmatic hernia again noted as seen on prior
imaging. No confluent airspace opacities or effusions. Heart is
normal size. No acute bony abnormality.
IMPRESSION: No active cardiopulmonary disease.

## 2022-04-02 NOTE — Progress Notes (Signed)
Subjective:    Patient ID: [name], male    DOB: April 26, 1941, 81 y.o.   MRN: [id number] Chief Complaint  Patient presents with   Follow-up    Ultrasound follow up    The patient returns to the office for followup and review of the noninvasive studies.   There have been no interval changes in lower extremity symptoms. No interval shortening of the patient's claudication distance or development of rest pain symptoms. No new ulcers or wounds have occurred since the last visit.  There have been no significant changes to the patient's overall health care.  The patient denies amaurosis fugax or recent TIA symptoms. There are no documented recent neurological changes noted. There is no history of DVT, PE or superficial thrombophlebitis. The patient denies recent episodes of angina or shortness of breath.   ABI Rt=1.11 and Lt=1.17  (previous ABI's Rt=1.14 and Lt=1.18) Duplex ultrasound of the bilateral tibial arteries reveals mainly biphasic waveforms with normal toe waveforms bilaterally.    Review of Systems  Musculoskeletal:  Positive for gait problem.  All other systems reviewed and are negative.      Objective:   Physical Exam Vitals reviewed.  HENT:     Head: Normocephalic.  Cardiovascular:     Rate and Rhythm: Normal rate.     Pulses: Normal pulses.  Pulmonary:     Effort: Pulmonary effort is normal.  Skin:    General: Skin is warm and dry.  Neurological:     Mental Status: He is alert and oriented to person, place, and time.  Psychiatric:        Mood and Affect: Mood normal.        Behavior: Behavior normal.        Thought Content: Thought content normal.        Judgment: Judgment normal.     BP 133/69 (BP Location: Left Arm)   Pulse 91   Resp 16   Wt 160 lb (72.6 kg)   BMI 25.06 kg/m   Past Medical History:  Diagnosis Date   Atypical chest pain    a. 05/2011 Neg MV; b. 04/2013 Neg MV; c. 04/2019 Echo: EF 55-60%, mild BAE. Degen MV dzs. Nl RV fxn; d.  06/2019 MV: EF 55%, small, mild, fixed apical and apical ant defect - probable artifact. No ischemia.   COPD (chronic obstructive pulmonary disease) (HCC)    Coronary artery calcification seen on CT scan    a. 07/2018 CT Chest - cor Ca2+.   Diabetes mellitus without complication (HCC)    Erosive gastropathy    a. 06/2019 EGD   GERD (gastroesophageal reflux disease)    Hypertension    Pulmonary nodules    a. 07/2018 Chest CT: RML and RLL nodules.   TIA (transient ischemic attack)     Social History   Socioeconomic History   Marital status: Widowed    Spouse name: Not on file   Number of children: Not on file   Years of education: Not on file   Highest education level: Not on file  Occupational History   Not on file  Tobacco Use   Smoking status: Every Day    Packs/day: 0.50    Years: 60.00    Total pack years: 30.00    Types: Cigarettes   Smokeless tobacco: Never   Tobacco comments:    still smoking 1/2 ppd.  Substance and Sexual Activity   Alcohol use: Yes   Drug use: No   Sexual activity:  Not on file  Other Topics Concern   Not on file  Social History Narrative   Lives locally by himself.  Children live locally but work and he says he is mostly alone.  Cont to smoke up to 1/2 ppd.  Does not routinely exercise.   Social Determinants of Health   Financial Resource Strain: Not on file  Food Insecurity: Not on file  Transportation Needs: Not on file  Physical Activity: Not on file  Stress: Not on file  Social Connections: Not on file  Intimate Partner Violence: Not on file    Past Surgical History:  Procedure Laterality Date   EYE SURGERY     LOWER EXTREMITY ANGIOGRAPHY Left 09/29/2020   Procedure: LOWER EXTREMITY ANGIOGRAPHY;  Surgeon: Katha Cabal, MD;  Location: [facility];  Service: Cardiovascular;  Laterality: Left;   LOWER EXTREMITY ANGIOGRAPHY Left 11/10/2020   Procedure: LOWER EXTREMITY ANGIOGRAPHY;  Surgeon: Katha Cabal, MD;   Location: [facility];  Service: Cardiovascular;  Laterality: Left;    Family History  Problem Relation Age of Onset   Diabetes Mellitus II Mother    Diabetes Mellitus II Father    Diabetes Mellitus II Sister    Diabetes Mellitus II Brother     Allergies  Allergen Reactions   Lisinopril Swelling    Lip and check swelling   Aspirin     Held due to anemia and fall risk. (tolerates low dose aspirin)   Codeine Other (See Comments)    Sweaty   Ivp Dye [Iodinated Contrast Media] Hives and Other (See Comments)    dizzy   Percocet [Oxycodone-Acetaminophen] Hives and Nausea Only       Latest Ref Rng & Units 03/29/2022    9:35 AM 04/18/2020    4:01 AM 04/17/2020    5:18 AM  CBC  WBC 4.0 - 10.5 K/uL 7.4  9.5  6.7   Hemoglobin 13.0 - 17.0 g/dL 11.0  11.7  11.4   Hematocrit 39.0 - 52.0 % 36.7  35.5  35.0   Platelets 150 - 400 K/uL 171  174  174       CMP     Component Value Date/Time   NA 137 03/29/2022 0935   NA 133 (L) 10/16/2013 0508   K 3.7 03/29/2022 0935   K 4.2 10/16/2013 0508   CL 105 03/29/2022 0935   CL 101 10/16/2013 0508   CO2 26 03/29/2022 0935   CO2 24 10/16/2013 0508   GLUCOSE 166 (H) 03/29/2022 0935   GLUCOSE 133 (H) 10/16/2013 0508   BUN 19 03/29/2022 0935   BUN 21 (H) 10/16/2013 0508   CREATININE 1.27 (H) 03/29/2022 0935   CREATININE 1.43 (H) 10/16/2013 0508   CALCIUM 8.6 (L) 03/29/2022 0935   CALCIUM 8.3 (L) 10/16/2013 0508   PROT 6.8 09/01/2019 1931   PROT 7.1 04/25/2013 1837   ALBUMIN 3.6 09/01/2019 1931   ALBUMIN 3.7 04/25/2013 1837   AST 25 09/01/2019 1931   AST 18 04/25/2013 1837   ALT 15 09/01/2019 1931   ALT 12 04/25/2013 1837   ALKPHOS 68 09/01/2019 1931   ALKPHOS 78 04/25/2013 1837   BILITOT 0.4 09/01/2019 1931   BILITOT 0.5 04/25/2013 1837   GFRNONAA 57 (L) 03/29/2022 0935   GFRNONAA 49 (L) 10/16/2013 0508   GFRAA >60 04/18/2020 0401   GFRAA 57 (L) 10/16/2013 0508     VAS [place] ABI WITH/WO TBI  Result Date: 03/29/2022   LOWER EXTREMITY DOPPLER STUDY  Patient Name:  [name]  Date of Exam:   03/16/2022 Medical Rec #: [id number]     Accession #:    [id number] Date of Birth: [birth year]     Patient Gender: M Patient Age:   7 years Exam Location:  [facility] Procedure:      VAS [place] ABI WITH/WO TBI Referring Phys: [facility] --------------------------------------------------------------------------------  Indications: Peripheral artery disease.  Vascular Interventions: Multiple interventions 2021 2022. Performing Technologist: Concha Norway RVT  Examination Guidelines: A complete evaluation includes at minimum, Doppler waveform signals and systolic blood pressure reading at the level of bilateral brachial, anterior tibial, and posterior tibial arteries, when vessel segments are accessible. Bilateral testing is considered an integral part of a complete examination. Photoelectric Plethysmograph (PPG) waveforms and toe systolic pressure readings are included as required and additional duplex testing as needed. Limited examinations for reoccurring indications may be performed as noted.  ABI Findings: +---------+------------------+-----+--------+--------+ Right    Rt Pressure (mmHg)IndexWaveformComment  +---------+------------------+-----+--------+--------+ Brachial 108                                     +---------+------------------+-----+--------+--------+ ATA      109               0.99 biphasic         +---------+------------------+-----+--------+--------+ PTA      122               1.11 biphasic         +---------+------------------+-----+--------+--------+ Great Toe77                0.70 Normal           +---------+------------------+-----+--------+--------+ +---------+------------------+-----+---------+-------+ Left     Lt Pressure (mmHg)IndexWaveform Comment +---------+------------------+-----+---------+-------+ Brachial 110                                      +---------+------------------+-----+---------+-------+ ATA      129               1.17 biphasic         +---------+------------------+-----+---------+-------+ PTA      122               1.11 triphasic        +---------+------------------+-----+---------+-------+ Great Toe115               1.05 Normal           +---------+------------------+-----+---------+-------+ +-------+-----------+-----------+------------+------------+ ABI/TBIToday's ABIToday's TBIPrevious ABIPrevious TBI +-------+-----------+-----------+------------+------------+ Right  1.11       .70        1.14        1.06         +-------+-----------+-----------+------------+------------+ Left   1.17       1.05       1.18        1.06         +-------+-----------+-----------+------------+------------+ Bilateral ABIs appear essentially unchanged compared to prior study on 08/2021.  Summary: Right: Resting right ankle-brachial index is within normal range. No evidence of significant right lower extremity arterial disease. The right toe-brachial index is normal. Left: Resting left ankle-brachial index is within normal range. No evidence of significant left lower extremity arterial disease. The left toe-brachial index is normal. *See table(s) above for measurements and observations.  Electronically signed by Leotis Pain MD on 03/29/2022  at 12:50:15 PM.    Final        Assessment & Plan:   1. Atherosclerosis of native artery of both lower extremities with intermittent claudication (HCC)  Recommend:  The patient has evidence of atherosclerosis of the lower extremities with claudication.  The patient does not voice lifestyle limiting changes at this point in time.  Noninvasive studies do not suggest clinically significant change.  No invasive studies, angiography or surgery at this time The patient should continue walking and begin a more formal exercise program.  The patient should continue antiplatelet therapy and  aggressive treatment of the lipid abnormalities  No changes in the patient's medications at this time  Continued surveillance is indicated as atherosclerosis is likely to progress with time.    The patient will continue follow up with noninvasive studies as ordered.    2. Primary hypertension Continue antihypertensive medications as already ordered, these medications have been reviewed and there are no changes at this time.   3. Hyperlipidemia, unspecified hyperlipidemia type Continue statin as ordered and reviewed, no changes at this time   4. Tobacco abuse Smoking cessation was discussed, 3-10 minutes spent on this topic specifically    Current Outpatient Medications on File Prior to Visit  Medication Sig Dispense Refill   albuterol (VENTOLIN HFA) 108 (90 Base) MCG/ACT inhaler Inhale 2 puffs into the lungs every 6 (six) hours as needed for shortness of breath or wheezing.     amLODipine (NORVASC) 10 MG tablet Take 1 tablet (10 mg total) by mouth daily. 30 tablet 0   aspirin 81 MG chewable tablet Chew 81 mg by mouth daily.     atorvastatin (LIPITOR) 40 MG tablet Take 40 mg by mouth daily.     clopidogrel (PLAVIX) 75 MG tablet TAKE 1 TABLET BY MOUTH EVERY DAY 90 tablet 1   ferrous sulfate 325 (65 FE) MG tablet Take 325 mg by mouth daily with breakfast.     gabapentin (NEURONTIN) 300 MG capsule Take 300 mg by mouth at bedtime.     lidocaine (LIDODERM) 5 % Place 1 patch onto the skin daily as needed (Pain).     lidocaine (XYLOCAINE) 5 % ointment APPLY 1 APPLICATION TOPICALLY AS NEEDED. 35.44 g 2   magnesium oxide (MAG-OX) 400 MG tablet Take 400 mg by mouth 2 (two) times daily.     metFORMIN (GLUCOPHAGE) 850 MG tablet Take 850 mg by mouth daily with breakfast.      metoprolol succinate (TOPROL-XL) 25 MG 24 hr tablet Take 12.5 mg by mouth daily.     mirtazapine (REMERON) 15 MG tablet Take 15 mg by mouth at bedtime.     ondansetron (ZOFRAN ODT) 4 MG disintegrating tablet Take 1 tablet (4  mg total) by mouth every 8 (eight) hours as needed for nausea or vomiting. 20 tablet 0   OneTouch Delica Lancets 08X MISC Apply topically.     pantoprazole (PROTONIX) 40 MG tablet Take 40 mg by mouth daily.     budesonide-formoterol (SYMBICORT) 160-4.5 MCG/ACT inhaler Inhale 2 puffs into the lungs daily. (Patient not taking: Reported on 03/16/2022)     No current facility-administered medications on file prior to visit.    There are no Patient Instructions on file for this visit. No follow-ups on file.   Kris Hartmann, NP

## 2022-05-04 ENCOUNTER — Ambulatory Visit: Payer: Medicare Other | Admitting: Physician Assistant

## 2022-05-11 ENCOUNTER — Encounter: Payer: Self-pay | Admitting: Medical Oncology

## 2022-05-11 ENCOUNTER — Emergency Department
Admission: EM | Admit: 2022-05-11 | Discharge: 2022-05-11 | Disposition: A | Discharge status: Home / Self Care | Payer: Medicare Other | Attending: Emergency Medicine | Admitting: Emergency Medicine

## 2022-05-11 DIAGNOSIS — R031 Nonspecific low blood-pressure reading: Secondary | ICD-10-CM | POA: Diagnosis present

## 2022-05-11 DIAGNOSIS — Z5321 Procedure and treatment not carried out due to patient leaving prior to being seen by health care provider: Secondary | ICD-10-CM | POA: Insufficient documentation

## 2022-05-11 LAB — BASIC METABOLIC PANEL
Anion gap: 6 (ref 5–15)
BUN: 18 mg/dL (ref 8–23)
CO2: 28 mmol/L (ref 22–32)
Calcium: 8.7 mg/dL — ABNORMAL LOW (ref 8.9–10.3)
Chloride: 101 mmol/L (ref 98–111)
Creatinine, Ser: 1.33 mg/dL — ABNORMAL HIGH (ref 0.61–1.24)
GFR, Estimated: 54 mL/min — ABNORMAL LOW (ref >60)
Glucose, Bld: 181 mg/dL — ABNORMAL HIGH (ref 70–99)
Potassium: 3.8 mmol/L (ref 3.5–5.1)
Sodium: 135 mmol/L (ref 135–145)

## 2022-05-11 LAB — CBC
HCT: 35.8 % — ABNORMAL LOW (ref 39.0–52.0)
Hemoglobin: 11 g/dL — ABNORMAL LOW (ref 13.0–17.0)
MCH: 25.5 pg — ABNORMAL LOW (ref 26.0–34.0)
MCHC: 30.7 g/dL (ref 30.0–36.0)
MCV: 82.9 fL (ref 80.0–100.0)
Platelets: 162 10*3/uL (ref 150–400)
RBC: 4.32 MIL/uL (ref 4.22–5.81)
RDW: 14.4 % (ref 11.5–15.5)
WBC: 6.5 10*3/uL (ref 4.0–10.5)
nRBC: 0 % (ref 0.0–0.2)

## 2022-05-11 NOTE — ED Notes (Signed)
Pt called this writer to ask to have his IV removed. Pt states he called his niece to come pick him up due to the wait time. Pt counseled on leaving AMA but pt wanted to leave. IV removed with no issue.

## 2022-05-11 NOTE — ED Triage Notes (Signed)
Pt from home via ems with reports that he woke up and started feeling "faint". Pt states that he checked his BP at home and it was "low". EMS reports that upon arrival BP was 90/60 lying and 70/34 standing. IV NS was administered and recheck prior to ED was 143/72. Pt reports that he feels much better now.

## 2022-08-13 ENCOUNTER — Emergency Department: Payer: Medicare Other

## 2022-08-13 ENCOUNTER — Observation Stay: Payer: Medicare Other

## 2022-08-13 ENCOUNTER — Observation Stay
Admission: EM | Admit: 2022-08-13 | Discharge: 2022-08-14 | Disposition: A | Discharge status: Home Health | Payer: Medicare Other | Attending: Student | Admitting: Student

## 2022-08-13 ENCOUNTER — Encounter: Payer: Self-pay | Admitting: Radiology

## 2022-08-13 DIAGNOSIS — R531 Weakness: Secondary | ICD-10-CM | POA: Diagnosis not present

## 2022-08-13 DIAGNOSIS — D509 Iron deficiency anemia, unspecified: Secondary | ICD-10-CM | POA: Diagnosis present

## 2022-08-13 DIAGNOSIS — Z7902 Long term (current) use of antithrombotics/antiplatelets: Secondary | ICD-10-CM | POA: Insufficient documentation

## 2022-08-13 DIAGNOSIS — Z7982 Long term (current) use of aspirin: Secondary | ICD-10-CM | POA: Insufficient documentation

## 2022-08-13 DIAGNOSIS — I10 Essential (primary) hypertension: Secondary | ICD-10-CM | POA: Diagnosis present

## 2022-08-13 DIAGNOSIS — Z7984 Long term (current) use of oral hypoglycemic drugs: Secondary | ICD-10-CM | POA: Insufficient documentation

## 2022-08-13 DIAGNOSIS — Z8673 Personal history of transient ischemic attack (TIA), and cerebral infarction without residual deficits: Secondary | ICD-10-CM | POA: Diagnosis not present

## 2022-08-13 DIAGNOSIS — I639 Cerebral infarction, unspecified: Secondary | ICD-10-CM | POA: Diagnosis not present

## 2022-08-13 DIAGNOSIS — E559 Vitamin D deficiency, unspecified: Secondary | ICD-10-CM | POA: Insufficient documentation

## 2022-08-13 DIAGNOSIS — J181 Lobar pneumonia, unspecified organism: Secondary | ICD-10-CM | POA: Diagnosis not present

## 2022-08-13 DIAGNOSIS — R079 Chest pain, unspecified: Secondary | ICD-10-CM | POA: Diagnosis present

## 2022-08-13 DIAGNOSIS — J449 Chronic obstructive pulmonary disease, unspecified: Secondary | ICD-10-CM | POA: Diagnosis not present

## 2022-08-13 DIAGNOSIS — F17210 Nicotine dependence, cigarettes, uncomplicated: Secondary | ICD-10-CM | POA: Insufficient documentation

## 2022-08-13 DIAGNOSIS — I2510 Atherosclerotic heart disease of native coronary artery without angina pectoris: Secondary | ICD-10-CM | POA: Insufficient documentation

## 2022-08-13 DIAGNOSIS — Z79899 Other long term (current) drug therapy: Secondary | ICD-10-CM | POA: Diagnosis not present

## 2022-08-13 DIAGNOSIS — R0789 Other chest pain: Secondary | ICD-10-CM

## 2022-08-13 DIAGNOSIS — E119 Type 2 diabetes mellitus without complications: Secondary | ICD-10-CM

## 2022-08-13 DIAGNOSIS — Z720 Tobacco use: Secondary | ICD-10-CM | POA: Diagnosis present

## 2022-08-13 DIAGNOSIS — K219 Gastro-esophageal reflux disease without esophagitis: Secondary | ICD-10-CM | POA: Diagnosis present

## 2022-08-13 LAB — BASIC METABOLIC PANEL
Anion gap: 7 (ref 5–15)
BUN: 23 mg/dL (ref 8–23)
CO2: 25 mmol/L (ref 22–32)
Calcium: 8.8 mg/dL — ABNORMAL LOW (ref 8.9–10.3)
Chloride: 105 mmol/L (ref 98–111)
Creatinine, Ser: 1.1 mg/dL (ref 0.61–1.24)
GFR, Estimated: >60 mL/min (ref >60)
Glucose, Bld: 100 mg/dL — ABNORMAL HIGH (ref 70–99)
Potassium: 3.7 mmol/L (ref 3.5–5.1)
Sodium: 137 mmol/L (ref 135–145)

## 2022-08-13 LAB — CBC
HCT: 33.4 % — ABNORMAL LOW (ref 39.0–52.0)
Hemoglobin: 10.5 g/dL — ABNORMAL LOW (ref 13.0–17.0)
MCH: 24.3 pg — ABNORMAL LOW (ref 26.0–34.0)
MCHC: 31.4 g/dL (ref 30.0–36.0)
MCV: 77.3 fL — ABNORMAL LOW (ref 80.0–100.0)
Platelets: 156 10*3/uL (ref 150–400)
RBC: 4.32 MIL/uL (ref 4.22–5.81)
RDW: 14.8 % (ref 11.5–15.5)
WBC: 6.8 10*3/uL (ref 4.0–10.5)
nRBC: 0 % (ref 0.0–0.2)

## 2022-08-13 LAB — PROTIME-INR
INR: 1 (ref 0.8–1.2)
Prothrombin Time: 13.4 seconds (ref 11.4–15.2)

## 2022-08-13 LAB — TROPONIN I (HIGH SENSITIVITY)
Troponin I (High Sensitivity): 50 ng/L — ABNORMAL HIGH (ref <18)
Troponin I (High Sensitivity): 49 ng/L — ABNORMAL HIGH (ref <18)

## 2022-08-13 LAB — LACTIC ACID, PLASMA: Lactic Acid, Venous: 1.7 mmol/L (ref 0.5–1.9)

## 2022-08-13 LAB — APTT: aPTT: 24 seconds (ref 24–36)

## 2022-08-13 MED ORDER — FERROUS SULFATE 325 (65 FE) MG PO TABS
325.0000 mg | ORAL_TABLET | Freq: Every day | ORAL | Status: DC
  Administered 2022-08-14: 325 mg via ORAL
  Filled 2022-08-13: qty 1

## 2022-08-13 MED ORDER — GABAPENTIN 300 MG PO CAPS
300.0000 mg | ORAL_CAPSULE | Freq: Every day | ORAL | Status: DC
  Administered 2022-08-13: 300 mg via ORAL
  Filled 2022-08-13: qty 1

## 2022-08-13 MED ORDER — PANTOPRAZOLE SODIUM 40 MG PO TBEC
40.0000 mg | DELAYED_RELEASE_TABLET | Freq: Every day | ORAL | Status: DC
  Administered 2022-08-14: 40 mg via ORAL
  Filled 2022-08-13: qty 1

## 2022-08-13 MED ORDER — SENNOSIDES-DOCUSATE SODIUM 8.6-50 MG PO TABS: 1.0000 | ORAL_TABLET | Freq: Every evening | ORAL | Status: DC | PRN

## 2022-08-13 MED ORDER — STROKE: EARLY STAGES OF RECOVERY BOOK: Freq: Once | Status: AC

## 2022-08-13 MED ORDER — NICOTINE 14 MG/24HR TD PT24
14.0000 mg | MEDICATED_PATCH | Freq: Every day | TRANSDERMAL | Status: DC
  Administered 2022-08-14: 14 mg via TRANSDERMAL
  Filled 2022-08-13: qty 1

## 2022-08-13 MED ORDER — MAGNESIUM OXIDE 400 MG PO TABS
400.0000 mg | ORAL_TABLET | Freq: Two times a day (BID) | ORAL | Status: DC
  Filled 2022-08-13 (×3): qty 1

## 2022-08-13 MED ORDER — ATORVASTATIN CALCIUM 20 MG PO TABS
40.0000 mg | ORAL_TABLET | Freq: Every day | ORAL | Status: DC
  Administered 2022-08-14: 40 mg via ORAL
  Filled 2022-08-13: qty 2

## 2022-08-13 MED ORDER — ASPIRIN 81 MG PO CHEW
81.0000 mg | CHEWABLE_TABLET | Freq: Every day | ORAL | Status: DC
  Administered 2022-08-14: 81 mg via ORAL
  Filled 2022-08-13: qty 1

## 2022-08-13 MED ORDER — SODIUM CHLORIDE 0.9 % IV SOLN
1.0000 g | Freq: Once | INTRAVENOUS | Status: AC
  Administered 2022-08-13: 1 g via INTRAVENOUS
  Filled 2022-08-13: qty 10

## 2022-08-13 MED ORDER — SODIUM CHLORIDE 0.9 % IV SOLN
500.0000 mg | Freq: Once | INTRAVENOUS | Status: AC
  Administered 2022-08-13: 500 mg via INTRAVENOUS
  Filled 2022-08-13: qty 5

## 2022-08-13 MED ORDER — CLOPIDOGREL BISULFATE 75 MG PO TABS
75.0000 mg | ORAL_TABLET | Freq: Every day | ORAL | Status: DC
  Administered 2022-08-14: 75 mg via ORAL
  Filled 2022-08-13: qty 1

## 2022-08-13 MED ORDER — MIRTAZAPINE 15 MG PO TABS
15.0000 mg | ORAL_TABLET | Freq: Every day | ORAL | Status: DC
  Administered 2022-08-13: 15 mg via ORAL
  Filled 2022-08-13: qty 1

## 2022-08-13 NOTE — Assessment & Plan Note (Signed)
Patient presents to the ER for evaluation of left-sided weakness/numbness concerning for stroke. Initial CT scan of the head without contrast did not show acute hemorrhage Continue dual antiplatelet therapy Continue atorvastatin Alone for permissive hypertension Obtain 2D echocardiogram to assess LVEF and rule out cardiac thrombus Obtain MRI of the brain to rule out an acute infarct Consult ST/PT/OT Appreciate neurology input

## 2022-08-13 NOTE — ED Notes (Signed)
Pt back from CT with this RN

## 2022-08-13 NOTE — Consult Note (Signed)
NEUROLOGY CONSULTATION NOTE   Date of service: August 13, 2022 Patient Name: [name] MRN:  [id number] DOB:  1941-01-04 Reason for consult: stroke code Requesting physician: Dr. Nance Pear _ _ _   _ __   _ __ _ _  __ __   _ __   __ _  History of Present Illness   81 yo man with hx COPD, HTN, DM, prior TIA BIB EMS for L sided weakness that developed at 1100. He feels weak and numb in his L face arm and leg. Shortly after that he became diaphoretic and nauseated and developed L CP radiating to L arm. CT head NAICP personal review. TNK not administered 2/2 presentation outside the window. CTA not performed 2/2 exam not c/w LVO (although NIHSS was 5 each point was for a symptom of mild severity) and 2/2 patient being allergic to CT contrast.   ROS   Per HPI: all other systems reviewed and are negative  Past History   I have reviewed the following:  Past Medical History:  Diagnosis Date   Atypical chest pain    a. 05/2011 Neg MV; b. 04/2013 Neg MV; c. 04/2019 Echo: EF 55-60%, mild BAE. Degen MV dzs. Nl RV fxn; d. 06/2019 MV: EF 55%, small, mild, fixed apical and apical ant defect - probable artifact. No ischemia.   COPD (chronic obstructive pulmonary disease) (HCC)    Coronary artery calcification seen on CT scan    a. 07/2018 CT Chest - cor Ca2+.   Diabetes mellitus without complication (HCC)    Erosive gastropathy    a. 06/2019 EGD   GERD (gastroesophageal reflux disease)    Hypertension    Pulmonary nodules    a. 07/2018 Chest CT: RML and RLL nodules.   TIA (transient ischemic attack)    Past Surgical History:  Procedure Laterality Date   EYE SURGERY     LOWER EXTREMITY ANGIOGRAPHY Left 09/29/2020   Procedure: LOWER EXTREMITY ANGIOGRAPHY;  Surgeon: Katha Cabal, MD;  Location: [facility];  Service: Cardiovascular;  Laterality: Left;   LOWER EXTREMITY ANGIOGRAPHY Left 11/10/2020   Procedure: LOWER EXTREMITY ANGIOGRAPHY;  Surgeon: Katha Cabal, MD;   Location: [facility];  Service: Cardiovascular;  Laterality: Left;   Family History  Problem Relation Age of Onset   Diabetes Mellitus II Mother    Diabetes Mellitus II Father    Diabetes Mellitus II Sister    Diabetes Mellitus II Brother    Social History   Socioeconomic History   Marital status: Widowed    Spouse name: Not on file   Number of children: Not on file   Years of education: Not on file   Highest education level: Not on file  Occupational History   Not on file  Tobacco Use   Smoking status: Every Day    Packs/day: 0.50    Years: 60.00    Total pack years: 30.00    Types: Cigarettes   Smokeless tobacco: Never   Tobacco comments:    still smoking 1/2 ppd.  Substance and Sexual Activity   Alcohol use: Yes   Drug use: No   Sexual activity: Not on file  Other Topics Concern   Not on file  Social History Narrative   Lives locally by himself.  Children live locally but work and he says he is mostly alone.  Cont to smoke up to 1/2 ppd.  Does not routinely exercise.   Social Determinants of Health  Financial Resource Strain: Not on file  Food Insecurity: Not on file  Transportation Needs: Not on file  Physical Activity: Not on file  Stress: Not on file  Social Connections: Not on file   Allergies  Allergen Reactions   Lisinopril Swelling    Lip and check swelling   Aspirin     Held due to anemia and fall risk. (tolerates low dose aspirin)   Codeine Other (See Comments)    Sweaty   Ivp Dye [Iodinated Contrast Media] Hives and Other (See Comments)    dizzy   Percocet [Oxycodone-Acetaminophen] Hives and Nausea Only    Medications   (Not in a hospital admission)    No current facility-administered medications for this encounter.  Current Outpatient Medications:    albuterol (VENTOLIN HFA) 108 (90 Base) MCG/ACT inhaler, Inhale 2 puffs into the lungs every 6 (six) hours as needed for shortness of breath or wheezing., Disp: , Rfl:     amLODipine (NORVASC) 10 MG tablet, Take 1 tablet (10 mg total) by mouth daily., Disp: 30 tablet, Rfl: 0   aspirin 81 MG chewable tablet, Chew 81 mg by mouth daily., Disp: , Rfl:    atorvastatin (LIPITOR) 40 MG tablet, Take 40 mg by mouth daily., Disp: , Rfl:    budesonide-formoterol (SYMBICORT) 160-4.5 MCG/ACT inhaler, Inhale 2 puffs into the lungs daily. (Patient not taking: Reported on 03/16/2022), Disp: , Rfl:    clopidogrel (PLAVIX) 75 MG tablet, TAKE 1 TABLET BY MOUTH EVERY DAY, Disp: 90 tablet, Rfl: 1   ferrous sulfate 325 (65 FE) MG tablet, Take 325 mg by mouth daily with breakfast., Disp: , Rfl:    gabapentin (NEURONTIN) 300 MG capsule, Take 300 mg by mouth at bedtime., Disp: , Rfl:    lidocaine (LIDODERM) 5 %, Place 1 patch onto the skin daily as needed (Pain)., Disp: , Rfl:    lidocaine (XYLOCAINE) 5 % ointment, APPLY 1 APPLICATION TOPICALLY AS NEEDED., Disp: 35.44 g, Rfl: 2   magnesium oxide (MAG-OX) 400 MG tablet, Take 400 mg by mouth 2 (two) times daily., Disp: , Rfl:    metFORMIN (GLUCOPHAGE) 850 MG tablet, Take 850 mg by mouth daily with breakfast. , Disp: , Rfl:    metoprolol succinate (TOPROL-XL) 25 MG 24 hr tablet, Take 12.5 mg by mouth daily., Disp: , Rfl:    mirtazapine (REMERON) 15 MG tablet, Take 15 mg by mouth at bedtime., Disp: , Rfl:    ondansetron (ZOFRAN ODT) 4 MG disintegrating tablet, Take 1 tablet (4 mg total) by mouth every 8 (eight) hours as needed for nausea or vomiting., Disp: 20 tablet, Rfl: 0   OneTouch Delica Lancets 95M MISC, Apply topically., Disp: , Rfl:    pantoprazole (PROTONIX) 40 MG tablet, Take 40 mg by mouth daily., Disp: , Rfl:   Vitals   Vitals:   08/13/22 1500 08/13/22 1512  BP: 138/70   Pulse: 65   Resp: 20   Temp:  (!) 96.5 F (35.8 C)  TempSrc:  Rectal  SpO2: 96%      There is no height or weight on file to calculate BMI.  Physical Exam   Physical Exam Gen: A&O x4, NAD HEENT: Atraumatic, normocephalic;mucous membranes moist;  oropharynx clear, tongue without atrophy or fasciculations. Neck: Supple, trachea midline. Resp: CTAB, no w/r/r CV: RRR, no m/g/r; nml S1 and S2. 2+ symmetric peripheral pulses. Abd: soft/NT/ND; nabs x 4 quad Extrem: Nml bulk; no cyanosis, clubbing, or edema.  Neuro: *MS: A&O x4. Follows multi-step commands.  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  L UMN facial droop   VIII: Hearing intact to voice   IX,X: Voice normal, palate elevates symmetrically    XI: SCM/trap 5/5 bilat   XII: Tongue protrudes midline, no atrophy or fasciculations   *Motor:   Normal bulk.  No tremor, rigidity or bradykinesia. Drift but not to bed LLE>LUE. RUE/RLE full strength. *Sensory: Impaired to LT LUE and LLE *Coordination:  FNF intact on R *Reflexes:  1+ and symmetric throughout without clonus; toes down-going bilat *Gait: deferred  NIHSS  1a Level of Conscious.: 0 1b LOC Questions: 0 1c LOC Commands: 0 2 Best Gaze: 0 3 Visual: 0 4 Facial Palsy: 1 5a Motor Arm - left: 1 5b Motor Arm - Right: 0 6a Motor Leg - Left: 1 6b Motor Leg - Right: 0 7 Limb Ataxia: 0 8 Sensory: 1 9 Best Language: 0 10 Dysarthria: 1 11 Extinct. and Inatten.: 0  TOTAL: 5   Premorbid mRS = 2  Labs   CBC:  Recent Labs  Lab 08/13/22 1508  WBC 6.8  HGB 10.5*  HCT 33.4*  MCV 77.3*  PLT 742    Basic Metabolic Panel:  Lab Results  Component Value Date   NA 135 05/11/2022   K 3.8 05/11/2022   CO2 28 05/11/2022   GLUCOSE 181 (H) 05/11/2022   BUN 18 05/11/2022   CREATININE 1.33 (H) 05/11/2022   CALCIUM 8.7 (L) 05/11/2022   GFRNONAA 54 (L) 05/11/2022   GFRAA >60 04/18/2020   Lipid Panel:  Lab Results   Component Value Date   LDLCALC 30 03/23/2020   HgbA1c:  Lab Results  Component Value Date   HGBA1C 6.2 (H) 03/22/2020   Urine Drug Screen:     Component Value Date/Time   LABOPIA NONE DETECTED 03/22/2020 1120   COCAINSCRNUR NONE DETECTED 03/22/2020 1120   LABBENZ NONE DETECTED 03/22/2020 1120   AMPHETMU NONE DETECTED 03/22/2020 1120   THCU NONE DETECTED 03/22/2020 1120   LABBARB NONE DETECTED 03/22/2020 1120    Alcohol Level No results found for: "ETH"   Impression   81 yo man with hx COPD, HTN, DM, prior TIA BIB EMS for L sided weakness that developed at 1100. He feels weak and numb in his L face arm and leg. Shortly after that he became diaphoretic and nauseated and developed L CP radiating to L arm. CT head NAICP personal review. TNK not administered 2/2 presentation outside the window. CTA not performed 2/2 exam not c/w LVO (although NIHSS was 5 each point was for a symptom of mild severity) and 2/2 patient being allergic to CT contrast. Sx c/f acute ischemic stroke, potentially in the setting of concurrent cardiac event.  Recommendations   - Cardiac workup per ED  Stroke workup as follows - Permissive HTN x48 hrs from sx onset or until stroke ruled out by MRI goal BP <220/110. PRN labetalol or hydralazine if BP above these parameters. Avoid oral antihypertensives. - MRI brain wo contrast - MRA H&N - TTE  - Check A1c and LDL + add statin per guidelines - ASA '81mg'$  daily + plavix '75mg'$  daily x21 days  f/b ASA '81mg'$  daily monotherapy after that - q4 hr neuro checks - STAT head CT for any change in neuro exam - Tele - PT/OT/SLP - Stroke education - Amb referral to neurology upon discharge  - Will continue to follow  ______________________________________________________________________   Thank you for the opportunity to take part in the care of this patient. If you have any further questions, please contact the neurology consultation attending.  Signed,  Su Monks, MD [facility] [phone]  If 7pm- 7am, please page neurology on call as listed in [place].  **Any copied and pasted documentation in this note was written by me in another application not billed for and pasted by me into this document.

## 2022-08-13 NOTE — H&P (Signed)
History and Physical    PatientMaxtyn Pineda BOF:[id number] DOB: August 18, 1941 DOA: 08/13/2022 DOS: the patient was seen and examined on 08/13/2022 PCP: Joanie Coddington, MD  Patient coming from: Home  Chief Complaint:  Chief Complaint  Patient presents with   Chest Pain   Shortness of Breath   Weakness   HPI: [name] is a 81 y.o. male with medical history significant for coronary artery disease status post left heart cath in 08/22 which showed 50% stenosis of the mid LAD, ischemic cardiomyopathy with last known LVEF of 40 to 45%, hypertension, dyslipidemia, history of SVT, diabetes mellitus on oral hypoglycemic agents, history of peripheral vascular disease status post percutaneous transluminal angioplasty on dual antiplatelet therapy, nicotine dependence who presents to the emergency room via EMS for evaluation of weakness and numbness involving his left side. Patient states that he was in his usual state of health and had gone for breakfast.  He returned home and noted that he was very weak and had difficulty getting out of his car.  He was able to make it into his house and he laid down and slept until about 2:30 PM when he woke up and realized he was still feeling very numb on his left side.  He also noted left-sided chest pain associated with dizziness, nausea and 1 episode of diarrhea and so he called EMS and was brought into the ER for evaluation. He denies having any difficulty swallowing, no blurred vision, no headache, no nausea, no vomiting, no abdominal pain, no fever, no chills, no cough, no urinary symptoms, no leg swelling, no blurred vision or focal deficit. Code stroke was called upon patient's arrival to the ER.  He was seen by neurology and was not a candidate for thrombolytics since he was outside the window.  Patient did not get a CT angiogram due to contrast allergy. Initial CT scan of the head done without contrast shows  no acute intracranial pathology. Postsurgical  changes reflecting prior functional endoscopic sinus surgery are again seen with polypoid mucosal thickening in the residual sinonasal cavity and layering fluid in the right maxillary and left sphenoid sinuses which could reflect acute sinusitis in the correct clinical setting. Chest x-ray shows findings suspicious for left lower lobe pneumonia Twelve-lead EKG reviewed by me shows sinus rhythm, PVCs, T wave inversions in the lateral leads. Patient received IV Rocephin and Zithromax in the ER and will be referred to observation status for further evaluation.     Review of Systems: As mentioned in the history of present illness. All other systems reviewed and are negative. Past Medical History:  Diagnosis Date   Atypical chest pain    a. 05/2011 Neg MV; b. 04/2013 Neg MV; c. 04/2019 Echo: EF 55-60%, mild BAE. Degen MV dzs. Nl RV fxn; d. 06/2019 MV: EF 55%, small, mild, fixed apical and apical ant defect - probable artifact. No ischemia.   COPD (chronic obstructive pulmonary disease) (HCC)    Coronary artery calcification seen on CT scan    a. 07/2018 CT Chest - cor Ca2+.   Diabetes mellitus without complication (HCC)    Erosive gastropathy    a. 06/2019 EGD   GERD (gastroesophageal reflux disease)    Hypertension    Pulmonary nodules    a. 07/2018 Chest CT: RML and RLL nodules.   TIA (transient ischemic attack)    Past Surgical History:  Procedure Laterality Date   EYE SURGERY     LOWER EXTREMITY ANGIOGRAPHY Left 09/29/2020   Procedure:  LOWER EXTREMITY ANGIOGRAPHY;  Surgeon: Katha Cabal, MD;  Location: [facility];  Service: Cardiovascular;  Laterality: Left;   LOWER EXTREMITY ANGIOGRAPHY Left 11/10/2020   Procedure: LOWER EXTREMITY ANGIOGRAPHY;  Surgeon: Katha Cabal, MD;  Location: [facility];  Service: Cardiovascular;  Laterality: Left;   Social History:  reports that he has been smoking cigarettes. He has a 30.00 pack-year smoking history. He has never  used smokeless tobacco. He reports current alcohol use. He reports that he does not use drugs.  Allergies  Allergen Reactions   Lisinopril Swelling    Lip and check swelling   Aspirin     Held due to anemia and fall risk. (tolerates low dose aspirin)   Codeine Other (See Comments)    Sweaty   Ivp Dye [Iodinated Contrast Media] Hives and Other (See Comments)    dizzy   Percocet [Oxycodone-Acetaminophen] Hives and Nausea Only    Family History  Problem Relation Age of Onset   Diabetes Mellitus II Mother    Diabetes Mellitus II Father    Diabetes Mellitus II Sister    Diabetes Mellitus II Brother     Prior to Admission medications   Medication Sig Start Date End Date Taking? Authorizing Provider  albuterol (VENTOLIN HFA) 108 (90 Base) MCG/ACT inhaler Inhale 2 puffs into the lungs every 6 (six) hours as needed for shortness of breath or wheezing. 12/07/15   [provider]  amLODipine (NORVASC) 10 MG tablet Take 1 tablet (10 mg total) by mouth daily. 05/30/16   Hower, Aaron Mose, MD  aspirin 81 MG chewable tablet Chew 81 mg by mouth daily. 06/03/20   [provider]  atorvastatin (LIPITOR) 40 MG tablet Take 40 mg by mouth daily. 01/21/20   [provider]  budesonide-formoterol (SYMBICORT) 160-4.5 MCG/ACT inhaler Inhale 2 puffs into the lungs daily. Patient not taking: Reported on 03/16/2022    [provider]  clopidogrel (PLAVIX) 75 MG tablet TAKE 1 TABLET BY MOUTH EVERY DAY 02/26/21   Schnier, Dolores Lory, MD  ferrous sulfate 325 (65 FE) MG tablet Take 325 mg by mouth daily with breakfast. 02/11/20   [provider]  gabapentin (NEURONTIN) 300 MG capsule Take 300 mg by mouth at bedtime. 08/06/19   [provider]  lidocaine (LIDODERM) 5 % Place 1 patch onto the skin daily as needed (Pain). 06/30/20   [provider]  lidocaine (XYLOCAINE) 5 % ointment APPLY 1 APPLICATION TOPICALLY AS NEEDED. 11/23/20   Kris Hartmann, NP  magnesium  oxide (MAG-OX) 400 MG tablet Take 400 mg by mouth 2 (two) times daily. 09/01/20   [provider]  metFORMIN (GLUCOPHAGE) 850 MG tablet Take 850 mg by mouth daily with breakfast.     [provider]  metoprolol succinate (TOPROL-XL) 25 MG 24 hr tablet Take 12.5 mg by mouth daily. 08/02/19   [provider]  mirtazapine (REMERON) 15 MG tablet Take 15 mg by mouth at bedtime.    [provider]  ondansetron (ZOFRAN ODT) 4 MG disintegrating tablet Take 1 tablet (4 mg total) by mouth every 8 (eight) hours as needed for nausea or vomiting. 01/07/20   Carrie Mew, MD  OneTouch Delica Lancets 27O MISC Apply topically. 05/26/20   [provider]  pantoprazole (PROTONIX) 40 MG tablet Take 40 mg by mouth daily. 01/15/20   [provider]    Physical Exam: Vitals:   08/13/22 1512 08/13/22 1530 08/13/22 1600 08/13/22 1630  BP:  Marland Kitchen)  151/79 129/61 135/80  Pulse:  61 66 63  Resp:  '18 18 14  '$ Temp: (!) 96.5 F (35.8 C)     TempSrc: Rectal     SpO2:  98% 99% 96%   Physical Exam Vitals and nursing note reviewed.  Constitutional:      Comments: Chronically ill-appearing  HENT:     Head: Normocephalic and atraumatic.  Cardiovascular:     Rate and Rhythm: Normal rate and regular rhythm.     Heart sounds: Normal heart sounds.     Comments: Reproducible chest pain over the left anterior chest wall Pulmonary:     Effort: Pulmonary effort is normal.     Breath sounds: Normal breath sounds.  Abdominal:     General: Bowel sounds are normal.     Palpations: Abdomen is soft.  Musculoskeletal:        General: Normal range of motion.     Cervical back: Normal range of motion and neck supple.  Skin:    General: Skin is warm and dry.  Neurological:     General: No focal deficit present.     Mental Status: He is alert.  Psychiatric:        Mood and Affect: Mood normal.        Behavior: Behavior normal.     Data Reviewed: Relevant notes from  primary care and specialist visits, past discharge summaries as available in EHR, including Care Everywhere. Prior diagnostic testing as pertinent to current admission diagnoses Updated medications and problem lists for reconciliation ED course, including vitals, labs, imaging, treatment and response to treatment Triage notes, nursing and pharmacy notes and ED provider's notes Notable results as noted in HPI Labs reviewed.  Lactic acid 1.7, sodium 137, potassium 3.7, chloride 105, bicarb 25, glucose 100, BUN 23, creatinine 1.10, calcium 8.8, troponin 50, PT 13.4, INR 1.0, white count 6.8, hemoglobin 10.5, hematocrit 33.4, platelet count 156 There are no new results to review at this time.  Assessment and Plan: * Acute CVA (cerebrovascular accident) [facility]) Patient presents to the ER for evaluation of left-sided weakness/numbness concerning for stroke. Initial CT scan of the head without contrast did not show acute hemorrhage Continue dual antiplatelet therapy Continue atorvastatin Alone for permissive hypertension Obtain 2D echocardiogram to assess LVEF and rule out cardiac thrombus Obtain MRI of the brain to rule out an acute infarct Consult ST/PT/OT Appreciate neurology input  Lobar pneumonia [facility]) Imaging shows findings concerning for left lower lobe pneumonia rule out aspiration pneumonia in this patient admitted to rule out an acute stroke Patient denies having any cough, no shortness of breath, no leukocytosis but was hypothermic on arrival to the ER Obtain CT scan of the chest without contrast for further evaluation Patient already received a dose of Rocephin and Zithromax We will hold off on further antibiotic therapy until results of CT scan become available  Iron deficiency anemia Chronic Continue iron supplement   Tobacco abuse Smoking cessation has been discussed with patient in detail We will place patient on a nicotine transdermal patch 14 mg daily  Hypertension Hold  metoprolol and amlodipine for now until an acute stroke has been ruled out with  GERD (gastroesophageal reflux disease) Continue PPI  Diabetes mellitus without complication (HCC) Hold metformin Check blood sugars with meals  Chest pain with high risk of acute coronary syndrome Patient with complaints of chest pain over the left anterior wall which is somewhat reproducible Has a known history of coronary artery disease We will cycle  cardiac enzymes Continue aspirin, Plavix and atorvastatin Obtain 2D echocardiogram to rule out regional wall motion abnormality.      Advance Care Planning:   Code Status: Full Code   Consults: Neurology  Family Communication: Greater than 50% of time spent discussing plan of care with patient at the bedside.  All questions and concerns have been addressed.  He verbalizes understanding and agrees with the plan.  CODE STATUS was discussed and he wishes to be full code.  Severity of Illness: The appropriate patient status for this patient is OBSERVATION. Observation status is judged to be reasonable and necessary in order to provide the required intensity of service to ensure the patient's safety. The patient's presenting symptoms, physical exam findings, and initial radiographic and laboratory data in the context of their medical condition is felt to place them at decreased risk for further clinical deterioration. Furthermore, it is anticipated that the patient will be medically stable for discharge from the hospital within 2 midnights of admission.   Author: Collier Bullock, MD 08/13/2022 5:32 PM  For on call review [email or web address].

## 2022-08-13 NOTE — ED Provider Notes (Signed)
[facility] Provider Note    Event Date/Time   First MD Initiated Contact with Patient 08/13/22 1505     (approximate)   History   Chest Pain, Shortness of Breath, and Weakness   HPI  [name] is a 81 y.o. male  who presents to the emergency department today because of concern for chest pain and weakness. Patient first noticed weakness when he was leaving brunch this morning around 11am. Typically used a walker to get around however realized he was having a much harder time with ambulation. The patient states that he also felt weakness in his arms. Left was worse than his right. The patient then went home and took a nap. When he woke up he felt that the weakness was worse and thus called EMS. This has been accompanied by some central chest pressure since he woke up. He denies any recent illness. Denies any recent fevers.     Physical Exam   Triage Vital Signs: ED Triage Vitals [08/13/22 1500]  Enc Vitals Group     BP 138/70     Pulse Rate 65     Resp 20     Temp      Temp src      SpO2 96 %     Weight      Height      Head Circumference      Peak Flow      Pain Score      Pain Loc      Pain Edu?      Excl. in [place]?     Most recent vital signs: Vitals:   08/13/22 1500  BP: 138/70  Pulse: 65  Resp: 20  SpO2: 96%   General: Awake, alert, oriented. CV:  Good peripheral perfusion. Regular rate and rhythm. Resp:  Normal effort. Lungs clear. Abd:  No distention.  Other:  Right eye reactive, EOMI. Left eye prosthesis. Slight tongue deviation to the right. Strength 5/5 in bilateral upper extremities. 5/5 in right lower extremity. 4+/5 in left lower extremity.    ED Results / Procedures / Treatments   Labs (all labs ordered are listed, but only abnormal results are displayed) Labs Reviewed - No data to display   EKG  I, Nance Pear, attending physician, personally viewed and interpreted this EKG  EKG Time: 1458 Rate: 61 Rhythm:  sinus rhythm with PVC Axis: normal Intervals: qtc 461 QRS: narrow ST changes: no st elevation Impression: abnormal ekg    RADIOLOGY I independently interpreted and visualized the CXR. My interpretation: No pneumonia Radiology interpretation:  IMPRESSION:  Findings suspicious for left lower lobe pneumonia.    I independently interpreted and visualized the CT head. My interpretation: No bleed. No large mass Radiology interpretation:  IMPRESSION:  1. No acute intracranial pathology.  2. Postsurgical changes reflecting prior functional endoscopic sinus  surgery are again seen with polypoid mucosal thickening in the  residual sinonasal cavity and layering fluid in the right maxillary  and left sphenoid sinuses which could reflect acute sinusitis in the  correct clinical setting.      PROCEDURES:  Critical Care performed: Yes, see critical care procedure note(s)  Procedures  CRITICAL CARE Performed by: Nance Pear   Total critical care time: 35 minutes  Critical care time was exclusive of separately billable procedures and treating other patients.  Critical care was necessary to treat or prevent imminent or life-threatening deterioration.  Critical care was time spent personally by me on the  following activities: development of treatment plan with patient and/or surrogate as well as nursing, discussions with consultants, evaluation of patient's response to treatment, examination of patient, obtaining history from patient or surrogate, ordering and performing treatments and interventions, ordering and review of laboratory studies, ordering and review of radiographic studies, pulse oximetry and re-evaluation of patient's condition.   MEDICATIONS ORDERED IN ED: Medications - No data to display   IMPRESSION / MDM / [place] / ED COURSE  I reviewed the triage vital signs and the nursing notes.                              Differential diagnosis includes,  but is not limited to, CVA, complex migraines, infection, anemia, electrolyte abnormality.  Patient's presentation is most consistent with acute presentation with potential threat to life or bodily function.  The patient is on the cardiac monitor to evaluate for evidence of arrhythmia and/or significant heart rate changes.  Patient presented to the emergency department today with primary concern for weakness (left greater than right) and chest pain. On exam patient does have weakness to the left lower leg and slight tongue deviation to the right. Last known well around 11am. Code stroke was called. EKG without concerning st elevation. Dr. Quinn Axe with neurology evaluated the patient. CT head without concerning finding. Patient is not a candidate for TNK and unforutnatly CTA was not able to be obtained given allergy to contrast. However does have concern for CVA. Blood work returned with slightly elevated troponin at 50, although it appears that this is consistent with patient's baseline. CXR concerning for possible pneumonia so antibiotics were started. Discussed with Dr. Francine Graven with the hospitalist service who will plan on admission.     FINAL CLINICAL IMPRESSION(S) / ED DIAGNOSES   Final diagnoses:  Weakness  Atypical chest pain      Note:  This document was prepared using Dragon voice recognition software and may include unintentional dictation errors.    Nance Pear, MD 08/13/22 [phone]

## 2022-08-13 NOTE — Assessment & Plan Note (Signed)
Chronic Continue iron supplement

## 2022-08-13 NOTE — ED Notes (Signed)
CODE stroke called by MD now

## 2022-08-13 NOTE — Assessment & Plan Note (Signed)
Imaging shows findings concerning for left lower lobe pneumonia rule out aspiration pneumonia in this patient admitted to rule out an acute stroke Patient denies having any cough, no shortness of breath, no leukocytosis but was hypothermic on arrival to the ER Obtain CT scan of the chest without contrast for further evaluation Patient already received a dose of Rocephin and Zithromax We will hold off on further antibiotic therapy until results of CT scan become available

## 2022-08-13 NOTE — Assessment & Plan Note (Signed)
Patient with complaints of chest pain over the left anterior wall which is somewhat reproducible Has a known history of coronary artery disease We will cycle cardiac enzymes Continue aspirin, Plavix and atorvastatin Obtain 2D echocardiogram to rule out regional wall motion abnormality.

## 2022-08-13 NOTE — Assessment & Plan Note (Signed)
Continue PPI ?

## 2022-08-13 NOTE — ED Notes (Signed)
Patient transported to CT, Johnney Ou with pt.

## 2022-08-13 NOTE — ED Triage Notes (Addendum)
Pt to ED via [facility] from [facility]. Pt reports he was trying to get to the care and started feeling generalized weakness in both upper and lower extremities about 3hrs PTA. Pt reports he then started having left sided CP radiating to left arm and SOB. HX MI. Temp 96.3 axillary. Pt diaphoretic and nauseas. On exam pt has decreased sensation to left side of face, left arm and left leg.   EMS VS: BP 94/54 and repaeat 122/62 after fluids 92% RA pt on 4L [place] HR 64 CBG 104 ETCO2 12 18g RAC

## 2022-08-13 NOTE — Assessment & Plan Note (Signed)
Hold metformin Check blood sugars with meals

## 2022-08-13 NOTE — Assessment & Plan Note (Signed)
Smoking cessation has been discussed with patient in detail We will place patient on a nicotine transdermal patch 14 mg daily 

## 2022-08-13 NOTE — ED Notes (Signed)
Activated Codestroke w/Kim @ [employer] 

## 2022-08-13 NOTE — Progress Notes (Signed)
Pt in CT. Chaplain will round back if needed when pt returns to room and things have settled please page chaplain on call phone if needs arise. [phone]

## 2022-08-13 NOTE — Assessment & Plan Note (Signed)
Hold metoprolol and amlodipine for now until an acute stroke has been ruled out with

## 2022-08-14 ENCOUNTER — Observation Stay: Admit: 2022-08-14 | Payer: Medicare Other

## 2022-08-14 ENCOUNTER — Observation Stay (HOSPITAL_BASED_OUTPATIENT_CLINIC_OR_DEPARTMENT_OTHER)
Admit: 2022-08-14 | Discharge: 2022-08-14 | Disposition: A | Discharge status: Home / Self Care | Payer: Medicare Other | Attending: Student | Admitting: Student

## 2022-08-14 DIAGNOSIS — I639 Cerebral infarction, unspecified: Secondary | ICD-10-CM | POA: Diagnosis not present

## 2022-08-14 DIAGNOSIS — J181 Lobar pneumonia, unspecified organism: Secondary | ICD-10-CM | POA: Diagnosis not present

## 2022-08-14 DIAGNOSIS — I6389 Other cerebral infarction: Secondary | ICD-10-CM

## 2022-08-14 LAB — IRON AND TIBC
Iron: 40 ug/dL — ABNORMAL LOW (ref 45–182)
Saturation Ratios: 12 % — ABNORMAL LOW (ref 17.9–39.5)
TIBC: 343 ug/dL (ref 250–450)
UIBC: 303 ug/dL

## 2022-08-14 LAB — ECHOCARDIOGRAM COMPLETE BUBBLE STUDY
AR max vel: 2.53 cm2
AV Area VTI: 2.48 cm2
AV Area mean vel: 2.46 cm2
AV Mean grad: 3 mmHg
AV Peak grad: 6.3 mmHg
Ao pk vel: 1.25 m/s
Area-P 1/2: 3.85 cm2
Calc EF: 42.2 %
MV VTI: 2.47 cm2
S' Lateral: 3.5 cm
Single Plane A2C EF: 33.3 %
Single Plane A4C EF: 52 %

## 2022-08-14 LAB — LIPID PANEL
Cholesterol: 92 mg/dL (ref 0–200)
HDL: 37 mg/dL — ABNORMAL LOW (ref >40)
LDL Cholesterol: 45 mg/dL (ref 0–99)
Total CHOL/HDL Ratio: 2.5 RATIO
Triglycerides: 50 mg/dL (ref <150)
VLDL: 10 mg/dL (ref 0–40)

## 2022-08-14 LAB — VITAMIN B12: Vitamin B-12: 215 pg/mL (ref 180–914)

## 2022-08-14 LAB — GLUCOSE, CAPILLARY
Glucose-Capillary: 109 mg/dL — ABNORMAL HIGH (ref 70–99)
Glucose-Capillary: 168 mg/dL — ABNORMAL HIGH (ref 70–99)
Glucose-Capillary: 153 mg/dL — ABNORMAL HIGH (ref 70–99)

## 2022-08-14 LAB — FOLATE: Folate: 7.8 ng/mL (ref >5.9)

## 2022-08-14 LAB — VITAMIN D 25 HYDROXY (VIT D DEFICIENCY, FRACTURES): Vit D, 25-Hydroxy: 26.07 ng/mL — ABNORMAL LOW (ref 30–100)

## 2022-08-14 LAB — TROPONIN I (HIGH SENSITIVITY): Troponin I (High Sensitivity): 45 ng/L — ABNORMAL HIGH (ref <18)

## 2022-08-14 MED ORDER — GUAIFENESIN ER 600 MG PO TB12: 600.0000 mg | ORAL_TABLET | Freq: Two times a day (BID) | ORAL | 0 refills | Status: AC

## 2022-08-14 MED ORDER — FOLIC ACID 1 MG PO TABS
1.0000 mg | ORAL_TABLET | Freq: Every day | ORAL | Status: DC
  Administered 2022-08-14: 1 mg via ORAL
  Filled 2022-08-14: qty 1

## 2022-08-14 MED ORDER — AMLODIPINE BESYLATE 5 MG PO TABS
5.0000 mg | ORAL_TABLET | Freq: Every day | ORAL | Status: DC
  Administered 2022-08-14: 5 mg via ORAL
  Filled 2022-08-14: qty 1

## 2022-08-14 MED ORDER — PERFLUTREN LIPID MICROSPHERE
1.0000 mL | INTRAVENOUS | Status: AC | PRN
  Administered 2022-08-14: 2 mL via INTRAVENOUS

## 2022-08-14 MED ORDER — METOPROLOL SUCCINATE ER 25 MG PO TB24
25.0000 mg | ORAL_TABLET | Freq: Every day | ORAL | Status: DC
  Administered 2022-08-14: 25 mg via ORAL
  Filled 2022-08-14: qty 1

## 2022-08-14 MED ORDER — GUAIFENESIN ER 600 MG PO TB12
600.0000 mg | ORAL_TABLET | Freq: Two times a day (BID) | ORAL | Status: DC
  Administered 2022-08-14: 600 mg via ORAL
  Filled 2022-08-14: qty 1

## 2022-08-14 MED ORDER — FOLIC ACID 1 MG PO TABS: 1.0000 mg | ORAL_TABLET | Freq: Every day | ORAL | 0 refills | Status: AC

## 2022-08-14 MED ORDER — ACETAMINOPHEN 325 MG PO TABS: 650.0000 mg | ORAL_TABLET | Freq: Four times a day (QID) | ORAL | Status: DC | PRN

## 2022-08-14 MED ORDER — POLYSACCHARIDE IRON COMPLEX 150 MG PO CAPS: 150.0000 mg | ORAL_CAPSULE | Freq: Every day | ORAL | Status: DC

## 2022-08-14 MED ORDER — ATORVASTATIN CALCIUM 40 MG PO TABS: 40.0000 mg | ORAL_TABLET | Freq: Every day | ORAL | 11 refills | Status: AC

## 2022-08-14 MED ORDER — VITAMIN D (ERGOCALCIFEROL) 1.25 MG (50000 UNIT) PO CAPS: 50000.0000 [IU] | ORAL_CAPSULE | ORAL | 0 refills | Status: AC

## 2022-08-14 MED ORDER — VITAMIN C 500 MG PO TABS
500.0000 mg | ORAL_TABLET | Freq: Every day | ORAL | Status: DC
  Administered 2022-08-14: 500 mg via ORAL
  Filled 2022-08-14: qty 1

## 2022-08-14 NOTE — Progress Notes (Signed)
OT Cancellation Note  Patient Details Name: [name] MRN: [id number] DOB: 09/19/1941   Cancelled Treatment:    Reason Eval/Treat Not Completed: OT screened, no needs identified, will sign off. Pt was seen by PT earlier today; states pt is at functional and mobility baseline. OT spoke with pt who reports that he feels back to normal after events leading to coming to the hospital. Denies UE changes; has shoulder pain at baseline from using rollator to assist with mobility. Pt asked OT about use of motorized scooter; OT encouraged pt to work with HHPT to discuss that possibility; encouraged pt to continue mobilizing as long as it was safe and he wasn't having falls. No further needs for OT in acute care or at d/c. Pt endorses independence in mobility and ADLs/IADLs, including driving.  Waymon Amato, MS, OTR/L   Vania Rea 08/14/2022, 11:20 AM

## 2022-08-14 NOTE — Evaluation (Signed)
Physical Therapy Evaluation Patient Details Name: [name] MRN: [id number] DOB: 04/21/1941 Today's Date: 08/14/2022  History of Present Illness  Patient is a 81 year old presenting with weakness and numbness of the left side. History of CAD, ischemic cardiomyopathy, hypertension, diabetes mellitus, PVD. Findings concerning for pneumonia. MRI of brain with no acute abnormality.  Clinical Impression  Patient is agreeable to PT evaluation. He reports he lives alone in a senior apartment with no steps to enter. He has a person care attendant 3 hours a day, 5 days a week that assist with cleaning and preparing one meal. He reports he ambulates at baseline with a rollator and was recently discussing with his primary care physician getting PT started due to chronic mobility issues. He drives short distances to [employer] to get breakfast routinely.  Today the patient is likely close to his baseline level of mobility. Mod I for bed mobility, supervision for transfers and ambulation. Gait deficits noted with left hip flexor weakness that patient reports is chronic. Recommend PT follow up to maximize independence and decrease caregiver burden. Anticipate patient can return home with HHPT recommended.      Recommendations for follow up therapy are one component of a multi-disciplinary discharge planning process, led by the attending physician.  Recommendations may be updated based on patient status, additional functional criteria and insurance authorization.  Follow Up Recommendations Home health PT      Assistance Recommended at Discharge PRN  Patient can return home with the following  Assistance with cooking/housework;Assist for transportation    Equipment Recommendations None recommended by PT  Recommendations for Other Services       Functional Status Assessment Patient has had a recent decline in their functional status and demonstrates the ability to make significant improvements in function  in a reasonable and predictable amount of time.     Precautions / Restrictions Precautions Precautions: Fall Restrictions Weight Bearing Restrictions: No      Mobility  Bed Mobility Overal bed mobility: Modified Independent             General bed mobility comments: increased time, no physical assistance needed    Transfers Overall transfer level: Needs assistance Equipment used: Rolling walker (2 wheels) Transfers: Sit to/from Stand Sit to Stand: Supervision           General transfer comment: supervision for safety    Ambulation/Gait Ambulation/Gait assistance: Supervision Gait Distance (Feet): 50 Feet Assistive device: Rolling walker (2 wheels) Gait Pattern/deviations: Decreased step length - left, Decreased stride length Gait velocity: decreased     General Gait Details: patient ambulated in room without physical assistance. gait deficits present with hip flexor weakness on the left that patient reports is chronic due to lumbar spine issues. no physical assistance required with ambulation. cues for safety  Stairs            Wheelchair Mobility    Modified Rankin (Stroke Patients Only)       Balance Overall balance assessment: Needs assistance Sitting-balance support: Feet supported Sitting balance-Leahy Scale: Good     Standing balance support: Bilateral upper extremity supported, During functional activity, Reliant on assistive device for balance Standing balance-Leahy Scale: Fair Standing balance comment: no external support required from therapist                             Pertinent Vitals/Pain Pain Assessment Pain Assessment: No/denies pain    Home Living Family/patient expects to be  discharged to:: Private residence Living Arrangements: Alone Available Help at Discharge: Personal care attendant (3 hours a day, 5 days per week) Type of Home: Apartment (senior apartments) Home Access: Level entry       Home  Layout: One [place]: Rollator (4 wheels)      Prior Function Prior Level of Function : Driving;Independent/Modified Independent             Mobility Comments: patient reports he drives short distances. He is Mod I with rollator for ambulation ADLs Comments: personal care attendant cleans and assists around the apartment as well as prepare one meal per day     Hand Dominance   Dominant Hand: Right    Extremity/Trunk Assessment   Upper Extremity Assessment Upper Extremity Assessment: Generalized weakness    Lower Extremity Assessment Lower Extremity Assessment: RLE deficits/detail;LLE deficits/detail RLE Deficits / Details: hip flexion 4+/5, dorsiflexion 4+/5, knee extension 4+/5 RLE Sensation: WNL LLE Deficits / Details: hip flexion 2+/5, dorsiflexion 4+/5, knee extension 4+/5 (patient reports hip flexor weakness is chronic d/t lumbar spine issues) LLE Sensation: WNL       Communication   Communication: No difficulties  Cognition Arousal/Alertness: Awake/alert Behavior During Therapy: WFL for tasks assessed/performed Overall Cognitive Status: Within Functional Limits for tasks assessed                                          General Comments      Exercises     Assessment/Plan    PT Assessment Patient needs continued PT services  PT Problem List Decreased strength;Decreased range of motion;Decreased activity tolerance;Decreased balance;Decreased mobility       PT Treatment Interventions DME instruction;Gait training;Functional mobility training;Therapeutic activities;Therapeutic exercise;Balance training;Neuromuscular re-education    PT Goals (Current goals can be found in the Care Plan section)  Acute Rehab PT Goals Patient Stated Goal: to return home PT Goal Formulation: With patient Time For Goal Achievement: 08/28/22 Potential to Achieve Goals: Good    Frequency Min 2X/week     Co-evaluation                AM-PAC PT "6 Clicks" Mobility  Outcome Measure Help needed turning from your back to your side while in a flat bed without using bedrails?: None Help needed moving from lying on your back to sitting on the side of a flat bed without using bedrails?: None Help needed moving to and from a bed to a chair (including a wheelchair)?: A Little Help needed standing up from a chair using your arms (e.g., wheelchair or bedside chair)?: A Little Help needed to walk in hospital room?: A Little Help needed climbing 3-5 steps with a railing? : A Lot 6 Click Score: 19    End of Session Equipment Utilized During Treatment: Gait belt Activity Tolerance: Patient tolerated treatment well Patient left: in bed;with call bell/phone within reach;with bed alarm set   PT Visit Diagnosis: Muscle weakness (generalized) (M62.81);Other abnormalities of gait and mobility (R26.89)    Time: 1660-6301 PT Time Calculation (min) (ACUTE ONLY): 22 min   Charges:   PT Evaluation $PT Eval Low Complexity: 1 Low PT Treatments $Therapeutic Activity: 8-22 mins        Minna Merritts, PT, MPT   Percell Locus 08/14/2022, 10:02 AM

## 2022-08-14 NOTE — Discharge Summary (Signed)
[facility] Discharge Summary   Patient: [name] YDX:[id number]  PCP: Joanie Coddington, MD  Date of admission: 08/13/2022   Date of discharge:  08/14/2022     Discharge Diagnoses:  Principal Problem:   Acute CVA (cerebrovascular accident) [facility]) Active Problems:   Lobar pneumonia ([facility])   Chest pain with high risk of acute coronary syndrome   Diabetes mellitus without complication (HCC)   GERD (gastroesophageal reflux disease)   Hypertension   Tobacco abuse   Iron deficiency anemia   Admitted From: Home Disposition:  Home with home health services  Recommendations for Outpatient Follow-up:  PCP: In 1 week Follow-up with neurology in 1 to 2 weeks Follow-up with pulmonary in 1 to 2 weeks for lymphadenopathy and mucous plug follow-up Follow with ENT in 1 to 2 weeks for chronic sinus polyps and sinusitis Follow up LABS/TEST: Repeat iron profile, folate level and vitamin D level after 3 to 6 months   Diet recommendation: Cardiac diet  Activity: The patient is advised to gradually reintroduce usual activities, as tolerated  Discharge Condition: stable  Code Status: Full code   History of present illness: As per the H and P dictated on admission Hospital Course:  [name] is a 81 y.o. male with medical history significant for coronary artery disease status post left heart cath in 08/22 which showed 50% stenosis of the mid LAD, ischemic cardiomyopathy with last known LVEF of 40 to 45%, hypertension, dyslipidemia, history of SVT, diabetes mellitus on oral hypoglycemic agents, history of peripheral vascular disease status post percutaneous transluminal angioplasty on dual antiplatelet therapy, nicotine dependence who presents to the emergency room via EMS for evaluation of weakness and numbness involving his left side.  Code stroke was called upon patient's arrival to the ER.  He was seen by neurology and was not a candidate for thrombolytics since he was outside the window.   Patient did not get a CT angiogram due to contrast allergy. Initial CT scan of the head done without contrast shows  no acute intracranial pathology. Postsurgical changes reflecting prior functional endoscopic sinus surgery are again seen with polypoid mucosal thickening in the residual sinonasal cavity and layering fluid in the right maxillary and left sphenoid sinuses which could reflect acute sinusitis in the correct clinical setting. Chest x-ray shows findings suspicious for left lower lobe pneumonia Twelve-lead EKG reviewed by me shows sinus rhythm, PVCs, T wave inversions in the lateral leads. Patient received IV Rocephin and Zithromax in the ER and will be referred to observation status for further evaluation. Assessment and Plan:  # Acute CVA (cerebrovascular accident)  Patient presents to the ER for evaluation of left-sided weakness/numbness concerning for stroke. Initial CT scan of the head without contrast did not show acute hemorrhage. Continue dual antiplatelet therapy,Continue atorvastatin.  MRI brain negative for any acute findings.  TTE shows LVEF 55 to 72%, grade 1 diastolic dysfunction, no PFO.  Patient's numbness has been resolved, weakness is improving, left leg is more weak than left arm.  Patient was seen by PT and OT, home health services arranged by TOC, swallowing remained normal.  Patient was cleared by neurology to follow-up as an outpatient. # Lobar pneumonia CT scan finding Imaging shows findings concerning for left lower lobe pneumonia rule out aspiration pneumonia in this patient admitted to rule out an acute stroke. Patient denies having any cough, no shortness of breath, no leukocytosis but was hypothermic on arrival to the ER.  CT scan chest shows bibasilar mucous, no infiltrates,  lymphadenopathy which needs follow-up.  She was given ceftriaxone and azithromycin in the ED, no more need of antibiotics.  Started Mucinex 600 twice daily for 2 weeks.  Patient was advised to  follow-up with PCP in pulmonologist as an outpatient # Iron deficiency anemia, Chronic, Continue iron supplement # Tobacco abuse, Smoking cessation has been discussed with patient in detail, s/p nicotine patch # Hypertension, resumed home meds on discharge. # GERD (gastroesophageal reflux disease), Continue PPI # Diabetes mellitus without complication, resumed home meds on discharge. # Chest pain with high risk of acute coronary syndrome. Patient with complaints of chest pain over the left anterior wall which is somewhat reproducible.  Troponin 50-49-45, remained flat.  Patient asymptomatic, Continue aspirin, Plavix and atorvastatin.  TTE shows LVEF 55 prescription, grade 1 diastolic dysfunction, negative PFO, no any wall motion abnormality.  Patient was seen by physical therapy, who recommended Home health, which was arranged. On the day of the discharge the patient's vitals were stable, and no other acute medical condition were reported by patient. the patient was felt safe to be discharge at Home with Home health.  Consultants: Neurology Procedures: None  Discharge Exam: General: Appear in no distress, no Rash; Oral Mucosa Clear, moist. Cardiovascular: S1 and S2 Present, no Murmur, Respiratory: normal respiratory effort, Bilateral Air entry present and no Crackles, no wheezes Abdomen: Bowel Sound present, Soft and no tenderness, no hernia Extremities: no Pedal edema, no calf tenderness Neurology: alert and oriented to time, place, and person. Left-sided weakness LLE weakness > LUE affect appropriate.  There were no vitals filed for this visit. Vitals:   08/14/22 0834 08/14/22 1205  BP: (!) 156/77 (!) 164/86  Pulse: 82 78  Resp: 18 16  Temp: (!) 97.5 F (36.4 C)   SpO2: 96% 96%    DISCHARGE MEDICATION: Allergies as of 08/14/2022       Reactions   Lisinopril Swelling   Lip and check swelling   Aspirin    Held due to anemia and fall risk. (tolerates low dose aspirin)    Codeine Other (See Comments)   Sweaty   Ivp Dye [iodinated Contrast Media] Hives, Other (See Comments)   dizzy   Percocet [oxycodone-acetaminophen] Hives, Nausea Only        Medication List     TAKE these medications    albuterol 108 (90 Base) MCG/ACT inhaler Commonly known as: VENTOLIN HFA Inhale 2 puffs into the lungs every 6 (six) hours as needed for shortness of breath or wheezing.   amLODipine 10 MG tablet Commonly known as: NORVASC Take 1 tablet (10 mg total) by mouth daily. What changed: how much to take   aspirin 81 MG chewable tablet Chew 81 mg by mouth daily.   atorvastatin 40 MG tablet Commonly known as: LIPITOR Take 1 tablet (40 mg total) by mouth daily. What changed: Another medication with the same name was removed. Continue taking this medication, and follow the directions you see here.   budesonide-formoterol 160-4.5 MCG/ACT inhaler Commonly known as: SYMBICORT Inhale 2 puffs into the lungs daily.   clopidogrel 75 MG tablet Commonly known as: PLAVIX TAKE 1 TABLET BY MOUTH EVERY DAY   ferrous sulfate 325 (65 FE) MG tablet Take 325 mg by mouth daily with breakfast.   folic acid 1 MG tablet Commonly known as: FOLVITE Take 1 tablet (1 mg total) by mouth daily. Start taking on: August 15, 2022   gabapentin 300 MG capsule Commonly known as: NEURONTIN Take 300 mg by mouth at  bedtime.   guaiFENesin 600 MG 12 hr tablet Commonly known as: MUCINEX Take 1 tablet (600 mg total) by mouth 2 (two) times daily for 14 days.   lidocaine 5 % Commonly known as: LIDODERM Place 1 patch onto the skin daily as needed (Pain).   lidocaine 5 % ointment Commonly known as: XYLOCAINE APPLY 1 APPLICATION TOPICALLY AS NEEDED.   magnesium oxide 400 MG tablet Commonly known as: MAG-OX Take 400 mg by mouth 2 (two) times daily.   metFORMIN 850 MG tablet Commonly known as: GLUCOPHAGE Take 850 mg by mouth daily with breakfast.   metoprolol succinate 25 MG 24 hr  tablet Commonly known as: TOPROL-XL Take 25 mg by mouth daily.   mirtazapine 15 MG tablet Commonly known as: REMERON Take 15 mg by mouth at bedtime.   ondansetron 4 MG disintegrating tablet Commonly known as: Zofran ODT Take 1 tablet (4 mg total) by mouth every 8 (eight) hours as needed for nausea or vomiting.   OneTouch Delica Lancets 64Q Misc Apply topically.   pantoprazole 40 MG tablet Commonly known as: PROTONIX Take 40 mg by mouth daily.   tamsulosin 0.4 MG Caps capsule Commonly known as: FLOMAX Take 0.4 mg by mouth daily.   Trelegy Ellipta 100-62.5-25 MCG/ACT Aepb Generic drug: Fluticasone-Umeclidin-Vilant Inhale 1 puff into the lungs 2 (two) times daily.   Vitamin D (Ergocalciferol) 1.25 MG (50000 UNIT) Caps capsule Commonly known as: DRISDOL Take 1 capsule (50,000 Units total) by mouth every 7 (seven) days.       Allergies  Allergen Reactions   Lisinopril Swelling    Lip and check swelling   Aspirin     Held due to anemia and fall risk. (tolerates low dose aspirin)   Codeine Other (See Comments)    Sweaty   Ivp Dye [Iodinated Contrast Media] Hives and Other (See Comments)    dizzy   Percocet [Oxycodone-Acetaminophen] Hives and Nausea Only   Discharge Instructions     Call MD for:   Complete by: As directed    Worsening of numbness or weakness or any new neurological symptoms.   Call MD for:  difficulty breathing, headache or visual disturbances   Complete by: As directed    Call MD for:  extreme fatigue   Complete by: As directed    Call MD for:  persistant dizziness or light-headedness   Complete by: As directed    Call MD for:  severe uncontrolled pain   Complete by: As directed    Diet - low sodium heart healthy   Complete by: As directed    Discharge instructions   Complete by: As directed    Follow with PCP in 1 week, patient needs repeat CT chest annually for ascending thoracic aneurysm and follow-up for lymphadenopathy and mucous plug in  the bilateral lower lobes.  Started on Mucinex. Follow with the ENT for chronic sinus issues. Follow with neurology in 1 to 2 weeks as an outpatient   Increase activity slowly   Complete by: As directed        The results of significant diagnostics from this hospitalization (including imaging, microbiology, ancillary and laboratory) are listed below for reference.    Significant Diagnostic Studies: ECHOCARDIOGRAM COMPLETE BUBBLE STUDY  Result Date: 08/14/2022    ECHOCARDIOGRAM REPORT   Patient Name:   [facility] Date of Exam: 08/14/2022 Medical Rec #:  [id number]    Height:       67.0 in Accession #:    [id number]  Weight:       160.9 lb Date of Birth:  1940-12-06    BSA:          1.844 m Patient Age:    81 years     BP:           154/92 mmHg Patient Gender: M            HR:           65 bpm. Exam Location:  [facility] Procedure: 2D Echo, Intracardiac Opacification Agent and Saline Contrast Bubble            Study Indications:     Stroke  History:         Patient has prior history of Echocardiogram examinations. CAD,                  COPD and TIA; Risk Factors:Hypertension.  Sonographer:     L Thornton-Maynard Referring Phys:  [id number] JXBJY Diagnosing Phys: Kate Sable MD  Sonographer Comments: Suboptimal parasternal window. Image acquisition challenging due to COPD. IMPRESSIONS  1. Left ventricular ejection fraction, by estimation, is 55%. The left ventricle has normal function. The left ventricle has no regional wall motion abnormalities. There is mild left ventricular hypertrophy. Left ventricular diastolic parameters are consistent with Grade I diastolic dysfunction (impaired relaxation).  2. Right ventricular systolic function is normal. The right ventricular size is normal.  3. The mitral valve is normal in structure. Trivial mitral valve regurgitation.  4. The aortic valve was not well visualized. Aortic valve regurgitation is not visualized.  5. The inferior vena cava is normal in  size with greater than 50% respiratory variability, suggesting right atrial pressure of 3 mmHg. FINDINGS  Left Ventricle: Left ventricular ejection fraction, by estimation, is 55%. The left ventricle has normal function. The left ventricle has no regional wall motion abnormalities. Definity contrast agent was given IV to delineate the left ventricular endocardial borders. The left ventricular internal cavity size was normal in size. There is mild left ventricular hypertrophy. Left ventricular diastolic parameters are consistent with Grade I diastolic dysfunction (impaired relaxation). Right Ventricle: The right ventricular size is normal. No increase in right ventricular wall thickness. Right ventricular systolic function is normal. Left Atrium: Left atrial size was normal in size. Right Atrium: Right atrial size was normal in size. Pericardium: There is no evidence of pericardial effusion. Mitral Valve: The mitral valve is normal in structure. Trivial mitral valve regurgitation. MV peak gradient, 7.7 mmHg. The mean mitral valve gradient is 3.0 mmHg. Tricuspid Valve: The tricuspid valve is normal in structure. Tricuspid valve regurgitation is not demonstrated. Aortic Valve: The aortic valve was not well visualized. Aortic valve regurgitation is not visualized. Aortic valve mean gradient measures 3.0 mmHg. Aortic valve peak gradient measures 6.2 mmHg. Aortic valve area, by VTI measures 2.48 cm. Pulmonic Valve: The pulmonic valve was not well visualized. Pulmonic valve regurgitation is not visualized. Aorta: The aortic root is normal in size and structure. Venous: The inferior vena cava is normal in size with greater than 50% respiratory variability, suggesting right atrial pressure of 3 mmHg. IAS/Shunts: No atrial level shunt detected by color flow Doppler. Agitated saline contrast was given intravenously to evaluate for intracardiac shunting.  LEFT VENTRICLE PLAX 2D LVIDd:         4.90 cm     Diastology LVIDs:          3.50 cm     LV e' medial:    4.68  cm/s LV PW:         1.30 cm     LV E/e' medial:  15.4 LV IVS:        1.40 cm     LV e' lateral:   6.20 cm/s LVOT diam:     2.10 cm     LV E/e' lateral: 11.7 LV SV:         56 LV SV Index:   31 LVOT Area:     3.46 cm  LV Volumes (MOD) LV vol d, MOD A2C: 61.8 ml LV vol d, MOD A4C: 63.8 ml LV vol s, MOD A2C: 41.2 ml LV vol s, MOD A4C: 30.6 ml LV SV MOD A2C:     20.6 ml LV SV MOD A4C:     63.8 ml LV SV MOD BP:      26.2 ml RIGHT VENTRICLE RV S prime:     12.30 cm/s TAPSE (M-mode): 2.2 cm LEFT ATRIUM             Index        RIGHT ATRIUM           Index LA Vol (A2C):   38.4 ml 20.83 ml/m  RA Area:     19.60 cm LA Vol (A4C):   30.1 ml 16.32 ml/m  RA Volume:   53.80 ml  29.18 ml/m LA Biplane Vol: 35.5 ml 19.25 ml/m  AORTIC VALVE AV Area (Vmax):    2.53 cm AV Area (Vmean):   2.46 cm AV Area (VTI):     2.48 cm AV Vmax:           125.00 cm/s AV Vmean:          82.100 cm/s AV VTI:            0.228 m AV Peak Grad:      6.2 mmHg AV Mean Grad:      3.0 mmHg LVOT Vmax:         91.20 cm/s LVOT Vmean:        58.200 cm/s LVOT VTI:          0.163 m LVOT/AV VTI ratio: 0.71  AORTA Ao Root diam: 3.70 cm Ao Asc diam:  3.60 cm MITRAL VALVE MV Area (PHT): 3.85 cm     SHUNTS MV Area VTI:   2.47 cm     Systemic VTI:  0.16 m MV Peak grad:  7.7 mmHg     Systemic Diam: 2.10 cm MV Mean grad:  3.0 mmHg MV Vmax:       1.39 m/s MV Vmean:      86.8 cm/s MV Decel Time: 197 msec MV E velocity: 72.30 cm/s MV A velocity: 125.00 cm/s MV E/A ratio:  0.58 Kate Sable MD Electronically signed by Kate Sable MD Signature Date/Time: 08/14/2022/4:01:45 PM    Final    MR BRAIN WO CONTRAST  Result Date: 08/13/2022 CLINICAL DATA:  Chest pain and weakness.  Stroke suspected. EXAM: MRI HEAD WITHOUT CONTRAST TECHNIQUE: Multiplanar, multiecho pulse sequences of the brain and surrounding structures were obtained without intravenous contrast. COMPARISON:  Same-day CT head FINDINGS: Brain: There is no acute  intracranial hemorrhage, extra-axial fluid collection, or acute infarct. Background parenchymal volume is normal for age. The ventricles are normal in size. Gray-white differentiation is preserved. Small remote infarcts are again seen in the left external capsule and caudate body. There is otherwise mild background chronic small-vessel ischemic change. There is no mass lesion.  There is no mass  effect or midline shift. Vascular: Normal flow voids. Skull and upper cervical spine: Normal marrow signal. Sinuses/Orbits: Postsurgical changes in the paranasal sinuses are again seen with layering fluid in the right maxillary and left sphenoid sinuses, described on the same-day CT head. A left globe prosthesis is noted. The globes and orbits are otherwise unremarkable. Other: None. IMPRESSION: No acute intracranial pathology. Electronically Signed   By: Valetta Mole M.D.   On: 08/13/2022 18:23   CT CHEST WO CONTRAST  Result Date: 08/13/2022 CLINICAL DATA:  Pneumonia, complication suspected, xray done EXAM: CT CHEST WITHOUT CONTRAST TECHNIQUE: Multidetector CT imaging of the chest was performed following the standard protocol without IV contrast. RADIATION DOSE REDUCTION: This exam was performed according to the departmental dose-optimization program which includes automated exposure control, adjustment of the mA and/or kV according to patient size and/or use of iterative reconstruction technique. COMPARISON:  CT chest 06/03/2011 FINDINGS: Cardiovascular: Prominent heart size. No significant pericardial effusion. The ascending thoracic aorta is enlarged in caliber measuring up to 4.2 cm. The descending thoracic aorta is normal in caliber. At least moderate no atherosclerotic plaque of the thoracic aorta. At least 3 vessel coronary artery calcifications. Mediastinum/Nodes: Borderline mediastinal lymphadenopathy with as an example a right paratracheal 1.1 cm lymph node and a 1.2 cm prevascular lymph node. No gross hilar  adenopathy, noting limited sensitivity for the detection of hilar adenopathy on this noncontrast study. No enlarged mediastinal or axillary lymph nodes. Thyroid gland, trachea, and esophagus demonstrate no significant findings. Tiny hiatal hernia. Lungs/Pleura: Mild to moderate centrilobular and paraseptal emphysematous changes. Elevated left hemidiaphragm with a likely passive atelectasis of left lower lobe. Trace peripheral reticulations. Diffuse bronchial wall thickening. Mucous plugging within the bilateral lower lobes. No focal consolidation. No pulmonary nodule. No pulmonary mass. No pleural effusion. No pneumothorax. Upper Abdomen: Similar-appearing chronic calcifications associated with the splenic hilum. Fluid density lesions within left kidney likely represent simple renal cysts. Simple renal cysts, in the absence of clinically indicated signs/symptoms, require no independent follow-up. Musculoskeletal: No chest wall abnormality. No suspicious lytic or blastic osseous lesions. No acute displaced fracture. Slight concavity of the T4 superior endplate may represent a chronic mild compression fracture. Multilevel degenerative changes of the spine. IMPRESSION: 1. Elevated left hemidiaphragm with likely passive atelectasis of left lower lobe. Superimposed developing infection/inflammation not excluded-limited evaluation on this noncontrast study. 2. Small airway disease with bilateral lower lobe mucous plugging. 3. Mediastinal lymphadenopathy-likely reactive in etiology. No gross hilar adenopathy, noting limited sensitivity for the detection of hilar adenopathy on this noncontrast study. Recommend attention on follow-up. 4. Mild pulmonary fibrosis. 5. 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 Recommend annual imaging followup by CTA or MRA. This recommendation follows 2010 ACCF/AHA/AATS/ACR/ASA/SCA/SCAI/SIR/STS/SVM Guidelines for the Diagnosis and Management of Patients with Thoracic Aortic Disease.  Circulation. 2010; 121: N629-B284. Aortic aneurysm NOS (ICD10-I71.9). 6. Aortic Atherosclerosis (ICD10-I70.0) and Emphysema (ICD10-J43.9). Electronically Signed   By: Iven Finn M.D.   On: 08/13/2022 18:17   DG Chest 2 View  Result Date: 08/13/2022 CLINICAL DATA:  Chest pain EXAM: CHEST - 2 VIEW COMPARISON:  Radiographs 04/16/2020 FINDINGS: Stable cardiomediastinal silhouette. Prominent right pulmonary artery. Aortic atherosclerotic calcification. Left lower lobe consolidation suspicious for pneumonia. No pleural effusion or pneumothorax. No acute osseous abnormality. IMPRESSION: Findings suspicious for left lower lobe pneumonia. Electronically Signed   By: Placido Sou M.D.   On: 08/13/2022 16:14   CT HEAD CODE STROKE WO CONTRAST  Result Date: 08/13/2022 CLINICAL DATA:  Left-sided weakness EXAM: CT HEAD  WITHOUT CONTRAST TECHNIQUE: Contiguous axial images were obtained from the base of the skull through the vertex without intravenous contrast. RADIATION DOSE REDUCTION: This exam was performed according to the departmental dose-optimization program which includes automated exposure control, adjustment of the mA and/or kV according to patient size and/or use of iterative reconstruction technique. COMPARISON:  CT head 01/07/2020, brain MRI 12/12/2010 FINDINGS: Brain: There is no acute intracranial hemorrhage, extra-axial fluid collection, or acute infarct. Background parenchymal volume is normal for age. The ventricles are normal in size. Gray-white differentiation is preserved. There are small remote lacunar infarcts in the left caudate body and external capsule. There is no mass lesion. There is no mass effect or midline shift. Vascular: There is calcification of the bilateral carotid siphons. No dense vessel is seen. Skull: Normal. Negative for fracture or focal lesion. Sinuses/Orbits: Postsurgical changes reflecting prior functional endoscopic sinus surgery are again seen with polypoid mucosal  thickening in the residual sinonasal cavity and layering fluid in the right maxillary and left sphenoid sinuses. Postsurgical changes in the left globe/orbit are stable since 2020. The right globe and orbit are unremarkable. Other: None. IMPRESSION: 1. No acute intracranial pathology. 2. Postsurgical changes reflecting prior functional endoscopic sinus surgery are again seen with polypoid mucosal thickening in the residual sinonasal cavity and layering fluid in the right maxillary and left sphenoid sinuses which could reflect acute sinusitis in the correct clinical setting. Findings  were communicated to Dr. Quinn Axe on 08/13/2022 at 3:43 pm. Electronically Signed   By: Valetta Mole M.D.   On: 08/13/2022 15:48    Microbiology: Recent Results (from the past 240 hour(s))  Blood culture (routine x 2)     Status: None (Preliminary result)   Collection Time: 08/13/22  3:08 PM   Specimen: BLOOD LEFT ARM  Result Value Ref Range Status   Specimen Description BLOOD LEFT ARM  Final   Special Requests   Final    BOTTLES DRAWN AEROBIC AND ANAEROBIC Blood Culture adequate volume   Culture   Final    NO GROWTH < 24 HOURS Performed at [facility], [address] [place], [place] [address]    Report Status PENDING  Incomplete  Blood culture (routine x 2)     Status: None (Preliminary result)   Collection Time: 08/13/22  3:09 PM   Specimen: BLOOD RIGHT ARM  Result Value Ref Range Status   Specimen Description BLOOD RIGHT ARM  Final   Special Requests   Final    BOTTLES DRAWN AEROBIC AND ANAEROBIC Blood Culture adequate volume   Culture   Final    NO GROWTH < 24 HOURS Performed at [facility], [address], [place],  [address]    Report Status PENDING  Incomplete     Labs: CBC: Recent Labs  Lab 08/13/22 1508  WBC 6.8  HGB 10.5*  HCT 33.4*  MCV 77.3*  PLT 130   Basic Metabolic Panel: Recent Labs  Lab 08/13/22 1508  NA 137  K 3.7  CL 105  CO2 25  GLUCOSE 100*   BUN 23  CREATININE 1.10  CALCIUM 8.8*   Liver Function Tests: No results for input(s): "AST", "ALT", "ALKPHOS", "BILITOT", "PROT", "ALBUMIN" in the last 168 hours. No results for input(s): "LIPASE", "AMYLASE" in the last 168 hours. No results for input(s): "AMMONIA" in the last 168 hours. Cardiac Enzymes: No results for input(s): "CKTOTAL", "CKMB", "CKMBINDEX", "TROPONINI" in the last 168 hours. BNP (last 3 results) No results for input(s): "BNP" in  the last 8760 hours. CBG: Recent Labs  Lab 08/14/22 0931 08/14/22 1311 08/14/22 1551  GLUCAP 109* 168* 153*    Time spent: 35 minutes  Signed:  Val Riles  [facility]  08/14/2022 4:18 PM

## 2022-08-14 NOTE — TOC Initial Note (Signed)
Transition of Care [facility] - Initial/Assessment Note    Patient Details  Name: [name] MRN: [id number] Date of Birth: 1941/08/27  Transition of Care [facility] CM/SW Contact:    Eileen Stanford, LCSW Phone Number: 08/14/2022, 1:39 PM  Clinical Narrative:  CSW spoke with pt in regards to dc plan. Pt is agreeable to [facility]. Pt has used [facility] in the past and is agreeable to use them again. Pt states he has a walker at home and denies needing any other dme. Pt states his son will pick him up at d/c.                 Expected Discharge Plan: [place] Barriers to Discharge: No Barriers Identified   Patient Goals and CMS Choice Patient states their goals for this hospitalization and ongoing recovery are:: to go home   Choice offered to / list presented to : Patient  Expected Discharge Plan and Services Expected Discharge Plan: [place] In-house Referral: Clinical Social Work   Post Acute Care Choice: [place] arrangements for the past 2 months: [place]: PT [place]: [place] Date [place]: 08/14/22 Time [place]: [place] Representative spoke with at [place]: [place] Arrangements/Services Living arrangements for the past 2 months: [place] with:: Self Patient language and need for interpreter reviewed:: Yes Do you feel safe going back to the place where you live?: Yes      Need for Family Participation in Patient Care: Yes (Comment) Care giver support system in place?: Yes (comment)   Criminal Activity/Legal Involvement Pertinent to Current Situation/Hospitalization: No - Comment as needed  Activities of Daily Living      Permission Sought/Granted Permission sought to share information with : Family Supports Permission granted to share information with : Yes, Verbal Permission Granted  Share Information  with NAME: Eddie Dibbles  Permission granted to share info w AGENCY: [facility]  Permission granted to share info w Relationship: son     Emotional Assessment Appearance:: Appears stated age Attitude/Demeanor/Rapport: Engaged Affect (typically observed): Accepting Orientation: : Oriented to Self, Oriented to Place, Oriented to  Time, Oriented to Situation Alcohol / Substance Use: Not Applicable Psych Involvement: No (comment)  Admission diagnosis:  Weakness [R53.1] Atypical chest pain [R07.89] Acute CVA (cerebrovascular accident) [facility]) [I63.9] Patient Active Problem List   Diagnosis Date Noted   Acute CVA (cerebrovascular accident) ([place]) 08/13/2022   Lobar pneumonia ([place]) 08/13/2022   Atherosclerosis of native arteries of extremity with intermittent claudication ([place]) 09/02/2021   Adenomatous colon polyp 08/31/2020   VHD (valvular heart disease) 08/31/2020   Atherosclerosis of native arteries of extremity with rest pain ([place]) 08/31/2020   Financial difficulties 07/14/2020   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   Poor fluid intake 06/01/2020   Sciatica of left side 06/01/2020   Weight loss, abnormal 06/01/2020   Dizziness 04/16/2020   Iron deficiency anemia 04/16/2020   Anxiety 03/31/2020   Tobacco abuse 03/24/2020   NSTEMI (non-ST elevated myocardial infarction) ([place]) 03/23/2020   Chest pain 03/22/2020   HLD (hyperlipidemia) 03/22/2020   COPD (chronic obstructive pulmonary disease) ([place])    Diabetes mellitus without complication (HCC)    GERD (gastroesophageal reflux disease)    Hypertension    TIA (transient ischemic attack)  Elevated troponin    CAD (coronary artery disease)    Early satiety 02/21/2020   Grief reaction 09/08/2019   Adjustment reaction with anxiety and depression 09/02/2019   Chest pain with high risk of acute coronary syndrome 08/29/2019   Frequent PVCs 05/03/2019   Stressful life event affecting family 01/31/2019   Abnormal screening CT of chest 08/11/2018    Spinal stenosis of lumbar region 05/19/2018   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/29/2017   Ptosis, left eyelid 06/28/2017   DDD (degenerative disc disease), lumbar 06/09/2016   Near syncope 05/28/2016   COPD exacerbation ([place]) 05/27/2016   Benign prostatic hyperplasia with nocturia 11/23/2014   CKD stage 2 due to type 2 diabetes mellitus ([place]) 11/14/2014   Bilateral carotid artery stenosis 07/01/2014   Chronic kidney disease 05/05/2014   Orthostatic hypotension 05/05/2014   Solitary pulmonary nodule 03/03/2014   Hypertrophy of prostate with urinary obstruction and other lower urinary tract symptoms (LUTS) 02/13/2014   Presbyopia 01/18/2012   Subclinical hyperthyroidism 01/16/2007   Thyrotoxicosis 01/16/2007   PCP:  Joanie Coddington, MD Pharmacy:   [id number] [address] [place] [id number]: [phone]: [phone]    Social Determinants of Health (SDOH) Interventions    Readmission Risk Interventions     No data to display

## 2022-08-14 NOTE — Plan of Care (Signed)
Patient is adequate for discharge to previous living environment with family support following a full work-up.  Instructions to follow up with PCP and neurology in one week.  Patient transported in private vehicle with family.

## 2022-08-14 NOTE — Progress Notes (Signed)
This RN called echo to schedule echocardiogram complete bubble study with no answer.  Will attempt again.

## 2022-08-14 NOTE — Progress Notes (Signed)
SLP Cancellation Note  Patient Details Name: [name] MRN: [id number] DOB: 1940-12-19   Cancelled treatment:       Reason Eval/Treat Not Completed: SLP screened, no needs identified, will sign off   SLP consult received and appreciated. Chart review completed. MRI negative for acute findings. NIHSS = 0. Spoke with pt who denies changes to speech/language/cognition or swallowing.   Cherrie Gauze, M.S., [facility] [phone])  Quintella Baton 08/14/2022, 12:03 PM

## 2022-08-15 LAB — HEMOGLOBIN A1C
Hgb A1c MFr Bld: 7.6 % — ABNORMAL HIGH (ref 4.8–5.6)
Mean Plasma Glucose: 171 mg/dL

## 2022-08-18 LAB — CULTURE, BLOOD (ROUTINE X 2)
Culture: NO GROWTH
Culture: NO GROWTH
Special Requests: ADEQUATE
Special Requests: ADEQUATE

## 2022-09-19 HISTORY — PX: CARDIAC CATHETERIZATION: SHX172

## 2022-12-21 ENCOUNTER — Emergency Department
Admission: EM | Admit: 2022-12-21 | Discharge: 2022-12-22 | Discharge status: Against Medical Advice | Payer: 59 | Attending: Emergency Medicine | Admitting: Emergency Medicine

## 2022-12-21 ENCOUNTER — Emergency Department: Payer: 59

## 2022-12-21 ENCOUNTER — Other Ambulatory Visit: Payer: Self-pay

## 2022-12-21 DIAGNOSIS — R0789 Other chest pain: Secondary | ICD-10-CM | POA: Insufficient documentation

## 2022-12-21 DIAGNOSIS — Z5321 Procedure and treatment not carried out due to patient leaving prior to being seen by health care provider: Secondary | ICD-10-CM | POA: Insufficient documentation

## 2022-12-21 DIAGNOSIS — R42 Dizziness and giddiness: Secondary | ICD-10-CM | POA: Diagnosis present

## 2022-12-21 DIAGNOSIS — R55 Syncope and collapse: Secondary | ICD-10-CM | POA: Diagnosis not present

## 2022-12-21 DIAGNOSIS — R110 Nausea: Secondary | ICD-10-CM | POA: Diagnosis not present

## 2022-12-21 LAB — CBC
HCT: 37.2 % — ABNORMAL LOW (ref 39.0–52.0)
Hemoglobin: 10.6 g/dL — ABNORMAL LOW (ref 13.0–17.0)
MCH: 22.5 pg — ABNORMAL LOW (ref 26.0–34.0)
MCHC: 28.5 g/dL — ABNORMAL LOW (ref 30.0–36.0)
MCV: 79 fL — ABNORMAL LOW (ref 80.0–100.0)
Platelets: 178 10*3/uL (ref 150–400)
RBC: 4.71 MIL/uL (ref 4.22–5.81)
RDW: 16.8 % — ABNORMAL HIGH (ref 11.5–15.5)
WBC: 7.7 10*3/uL (ref 4.0–10.5)
nRBC: 0 % (ref 0.0–0.2)

## 2022-12-21 LAB — BASIC METABOLIC PANEL
Anion gap: 8 (ref 5–15)
BUN: 23 mg/dL (ref 8–23)
CO2: 25 mmol/L (ref 22–32)
Calcium: 8.6 mg/dL — ABNORMAL LOW (ref 8.9–10.3)
Chloride: 101 mmol/L (ref 98–111)
Creatinine, Ser: 1.47 mg/dL — ABNORMAL HIGH (ref 0.61–1.24)
GFR, Estimated: 48 mL/min — ABNORMAL LOW (ref >60)
Glucose, Bld: 110 mg/dL — ABNORMAL HIGH (ref 70–99)
Potassium: 3.9 mmol/L (ref 3.5–5.1)
Sodium: 134 mmol/L — ABNORMAL LOW (ref 135–145)

## 2022-12-21 LAB — TROPONIN I (HIGH SENSITIVITY)
Troponin I (High Sensitivity): 53 ng/L — ABNORMAL HIGH (ref <18)
Troponin I (High Sensitivity): 53 ng/L — ABNORMAL HIGH (ref <18)

## 2022-12-21 NOTE — ED Triage Notes (Signed)
Pt to ED [facility] from PCP. Pt c/o dizziness and nausea for past 3 hours. Pt also endorses tightness in chest. States thinks it is acid reflux, out of reflux meds.

## 2022-12-21 NOTE — ED Triage Notes (Signed)
Pt to ED via [facility] from PCP. Pt reports near syncopal episode today with dizziness. PCP called EMS for further valuation.   EMS VS:  95% RA BP 127/65 HR 76 CBG 127

## 2022-12-22 ENCOUNTER — Telehealth: Payer: Self-pay | Admitting: Emergency Medicine

## 2022-12-22 NOTE — Telephone Encounter (Signed)
Called patient due to left emergency department before provider exam to inquire about condition and follow up plans. Left message. 

## 2022-12-22 NOTE — ED Notes (Signed)
No answer when called several times from lobby 

## 2023-01-27 ENCOUNTER — Emergency Department: Payer: 59

## 2023-01-27 ENCOUNTER — Emergency Department
Admission: EM | Admit: 2023-01-27 | Discharge: 2023-01-27 | Disposition: A | Discharge status: Home / Self Care | Payer: 59 | Attending: Emergency Medicine | Admitting: Emergency Medicine

## 2023-01-27 DIAGNOSIS — R42 Dizziness and giddiness: Secondary | ICD-10-CM

## 2023-01-27 DIAGNOSIS — I952 Hypotension due to drugs: Secondary | ICD-10-CM | POA: Diagnosis not present

## 2023-01-27 DIAGNOSIS — Z79899 Other long term (current) drug therapy: Secondary | ICD-10-CM | POA: Insufficient documentation

## 2023-01-27 DIAGNOSIS — D649 Anemia, unspecified: Secondary | ICD-10-CM | POA: Diagnosis not present

## 2023-01-27 DIAGNOSIS — D500 Iron deficiency anemia secondary to blood loss (chronic): Secondary | ICD-10-CM

## 2023-01-27 DIAGNOSIS — R0789 Other chest pain: Secondary | ICD-10-CM | POA: Insufficient documentation

## 2023-01-27 LAB — URINALYSIS, ROUTINE W REFLEX MICROSCOPIC
Bilirubin Urine: NEGATIVE
Glucose, UA: NEGATIVE mg/dL
Hgb urine dipstick: NEGATIVE
Ketones, ur: NEGATIVE mg/dL
Leukocytes,Ua: NEGATIVE
Nitrite: NEGATIVE
Protein, ur: NEGATIVE mg/dL
Specific Gravity, Urine: 1.013 (ref 1.005–1.030)
pH: 7 (ref 5.0–8.0)

## 2023-01-27 LAB — COMPREHENSIVE METABOLIC PANEL
ALT: 11 U/L (ref 0–44)
AST: 16 U/L (ref 15–41)
Albumin: 3.1 g/dL — ABNORMAL LOW (ref 3.5–5.0)
Alkaline Phosphatase: 94 U/L (ref 38–126)
Anion gap: 5 (ref 5–15)
BUN: 18 mg/dL (ref 8–23)
CO2: 27 mmol/L (ref 22–32)
Calcium: 8.3 mg/dL — ABNORMAL LOW (ref 8.9–10.3)
Chloride: 103 mmol/L (ref 98–111)
Creatinine, Ser: 1.3 mg/dL — ABNORMAL HIGH (ref 0.61–1.24)
GFR, Estimated: 56 mL/min — ABNORMAL LOW (ref >60)
Glucose, Bld: 137 mg/dL — ABNORMAL HIGH (ref 70–99)
Potassium: 4.3 mmol/L (ref 3.5–5.1)
Sodium: 135 mmol/L (ref 135–145)
Total Bilirubin: 0.4 mg/dL (ref 0.3–1.2)
Total Protein: 5.8 g/dL — ABNORMAL LOW (ref 6.5–8.1)

## 2023-01-27 LAB — CBC WITH DIFFERENTIAL/PLATELET
Abs Immature Granulocytes: 0.03 10*3/uL (ref 0.00–0.07)
Basophils Absolute: 0.1 10*3/uL (ref 0.0–0.1)
Basophils Relative: 1 %
Eosinophils Absolute: 0.4 10*3/uL (ref 0.0–0.5)
Eosinophils Relative: 5 %
HCT: 29.8 % — ABNORMAL LOW (ref 39.0–52.0)
Hemoglobin: 8.8 g/dL — ABNORMAL LOW (ref 13.0–17.0)
Immature Granulocytes: 0 %
Lymphocytes Relative: 20 %
Lymphs Abs: 1.5 10*3/uL (ref 0.7–4.0)
MCH: 22.3 pg — ABNORMAL LOW (ref 26.0–34.0)
MCHC: 29.5 g/dL — ABNORMAL LOW (ref 30.0–36.0)
MCV: 75.4 fL — ABNORMAL LOW (ref 80.0–100.0)
Monocytes Absolute: 0.6 10*3/uL (ref 0.1–1.0)
Monocytes Relative: 9 %
Neutro Abs: 4.7 10*3/uL (ref 1.7–7.7)
Neutrophils Relative %: 65 %
Platelets: 159 10*3/uL (ref 150–400)
RBC: 3.95 MIL/uL — ABNORMAL LOW (ref 4.22–5.81)
RDW: 16.9 % — ABNORMAL HIGH (ref 11.5–15.5)
WBC: 7.2 10*3/uL (ref 4.0–10.5)
nRBC: 0 % (ref 0.0–0.2)

## 2023-01-27 LAB — TROPONIN I (HIGH SENSITIVITY)
Troponin I (High Sensitivity): 35 ng/L — ABNORMAL HIGH (ref <18)
Troponin I (High Sensitivity): 42 ng/L — ABNORMAL HIGH (ref <18)

## 2023-01-27 NOTE — ED Triage Notes (Signed)
Pt BIBA from [facility] where pt was sitting in his car and reports dizziness. EMS reports pt diaphoretic and had weak radial pulses. EMS gave 1150 of LR. Pt more alert. 12 lead showed 1st degree av block. Pt reports history of dehydration and said that he started a new cardiac med this am but unable to remember name of medication.

## 2023-01-27 NOTE — Discharge Instructions (Addendum)
-   Always drink some water and eat some food prior to taking Imdur ( you can hold it for a few days until discussing with cardiology) - Avoid sitting in hot environments for prolonged durations while on Imdur - Start Iron polysaccharide and have hemoglobin rechecked in a week - Contact your GI doctor to schedule an appt - Contact your Cardiology team to let them know what happened -Call your vascular doctor to follow-up on your Plavix and maybe this can be discontinued   IMPRESSION: 1. Bibasilar linear opacities in keeping with atelectasis versus scarring. 2. Rounded left lower lobe opacity in keeping with known elevation of the left hemidiaphragm, which may represent eventration or focal diaphragmatic hernia.

## 2023-01-27 NOTE — Consult Note (Signed)
Initial Consultation Note   Patient: [name] ZOX:[id number] DOB: 1940-11-24 PCP: Leanord Asal, Nelva Bush, MD DOA: 01/27/2023 DOS: the patient was seen and examined on 01/27/2023 Primary service: Concha Se, MD  Referring physician: Dr. Fuller Plan Reason for consult: Hypotension  Assessment/Plan: Assessment and Plan:  # Hypotension  # Chest Pain  Patient is presenting with hypotension, dizziness and brief left-sided chest pain that occurred after taking multiple antihypertensives including his new prescription for Imdur without any oral intake.  I discussed with Mr. Cheeseman that given the vasodilatory effects of heat especially sitting in a car with the [facility] off in addition to taking Imdur right before, is the likely etiology of his symptoms.  Chest pain was likely secondary to demand in the setting of severe hypotension.  His blood pressure has resolved at this time and has maintained within normal limits since being in the ED.  Troponins have been flat and lower than his previous readings.  He has been able to walk around using a walker, as he does at home, without any symptoms.  We discussed extensively the importance of avoiding hot environments for prolonged periods of time, especially in the setting of antihypertensive medicine.  No indication at this time for emergent/urgent echocardiogram or further cardiac workup.  # Chronic Iron Deficiency Anemia Per chart review, patient has a history of chronic iron deficiency anemia, previously on iron supplementation.  This was discontinued after he became extremely constipated. Hemoglobin today is approximately 2 g less than it was prior, however stool was brown on rectal exam.  Hemoccult positive.  Patient follows with GI regularly and I recommended he contact their office to make an appointment to schedule colonoscopy, as per their notes, he has been previously hesitant to do so.   Recommendations:  - Always drink water and eat food prior to taking  antihypertensives - Avoid sitting in hot environments for prolonged durations while on multiple blood pressure medicines - Start Iron polysaccharide - Contact your GI doctor to schedule an appt - Contact your Cardiology team to let them know what happend  No indication for admission at this time. TRH will sign off at present, please call us again when needed.   HPI: [name] is a 82 y.o. 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  Mr. Lanton states he started a new medicine today called Imdur that was prescribed by his cardiology team 2 days ago.  He usually takes all of his medicines in the morning with water and breakfast, however this morning he was unable to eat breakfast as a friend asked him to take her to [place].  He was waiting for her in the car for approximately 30 minutes with the [facility] off.  He became very hot and started to feel dizzy.  He began to experience some black dots in his vision.  He had a brief episode of left-sided chest pain.  He called EMS who administered fluids, he felt better after this.  He denies any persistent dizziness, chest pain, shortness of breath, palpitations at this time.  He denies any nausea, vomiting, diarrhea, melena or hematochezia.  He notes that he stopped taking his iron a few months ago after becoming very constipated.  ED course: On arrival to the ED, patient was hypertensive at 143/83 with heart rate of 74.  He was saturating at  94% on room air.  He was afebrile at 97.4.  Initial workup demonstrated hemoglobin of 8.8, MCV of 75, platelets 159, bicarb 27, glucose 137, creatinine 1.30, albumin 3.1, and GFR 56.  Initial troponin 35 with flat trend to 42.  Urinalysis with no abnormalities. Chest x-ray with atelectasis versus scarring in addition to left hemidiaphragm.  Patient  given additional fluids and TRH contacted for consideration of admission.  Review of Systems: As mentioned in the history of present illness. All other systems reviewed and are negative.  Past Medical History:  Diagnosis Date   Atypical chest pain    a. 05/2011 Neg MV; b. 04/2013 Neg MV; c. 04/2019 Echo: EF 55-60%, mild BAE. Degen MV dzs. Nl RV fxn; d. 06/2019 MV: EF 55%, small, mild, fixed apical and apical ant defect - probable artifact. No ischemia.   COPD (chronic obstructive pulmonary disease) (HCC)    Coronary artery calcification seen on CT scan    a. 07/2018 CT Chest - cor Ca2+.   Diabetes mellitus without complication (HCC)    Erosive gastropathy    a. 06/2019 EGD   GERD (gastroesophageal reflux disease)    Hypertension    Pulmonary nodules    a. 07/2018 Chest CT: RML and RLL nodules.   TIA (transient ischemic attack)    Past Surgical History:  Procedure Laterality Date   EYE SURGERY     LOWER EXTREMITY ANGIOGRAPHY Left 09/29/2020   Procedure: LOWER EXTREMITY ANGIOGRAPHY;  Surgeon: Renford Dills, MD;  Location: [facility] INVASIVE CV LAB;  Service: Cardiovascular;  Laterality: Left;   LOWER EXTREMITY ANGIOGRAPHY Left 11/10/2020   Procedure: LOWER EXTREMITY ANGIOGRAPHY;  Surgeon: Renford Dills, MD;  Location: [facility] INVASIVE CV LAB;  Service: Cardiovascular;  Laterality: Left;   Social History:  reports that he has been smoking cigarettes. He has a 30.00 pack-year smoking history. He has never used smokeless tobacco. He reports current alcohol use. He reports that he does not use drugs.  Allergies  Allergen Reactions   Lisinopril Swelling    Lip and check swelling   Aspirin     Held due to anemia and fall risk. (tolerates low dose aspirin)   Codeine Other (See Comments)    Sweaty   Ivp Dye [Iodinated Contrast Media] Hives and Other (See Comments)    dizzy   Percocet [Oxycodone-Acetaminophen] Hives and Nausea Only    Family History  Problem Relation Age of Onset    Diabetes Mellitus II Mother    Diabetes Mellitus II Father    Diabetes Mellitus II Sister    Diabetes Mellitus II Brother     Prior to Admission medications   Medication Sig Start Date End Date Taking? Authorizing Provider  albuterol (PROVENTIL) (2.5 MG/3ML) 0.083% nebulizer solution Take 2.5 mg by nebulization every 4 (four) hours as needed. 1 Vial(s) Via Nebulizer Every 4-6 Hours PRN 01/04/23  Yes [provider]  amLODipine (NORVASC) 2.5 MG tablet Take 2.5 mg by mouth 2 (two) times daily. 12/28/22  Yes [provider]  DULoxetine (CYMBALTA) 30 MG capsule Take 1 capsule by mouth daily. 12/26/22  Yes [provider]  hydrOXYzine (ATARAX) 10 MG tablet Take 10 mg by mouth every 8 (eight) hours as needed. Take one tablet once daily if needed for anxiety. May take one additional tablet in 8 hrs if needed. Max of 3 tablets daily. 01/13/23  Yes [provider]  isosorbide mononitrate (IMDUR) 30 MG 24 hr tablet Take 1 tablet by mouth daily. 01/25/23 01/25/24  Yes [provider]  polyethylene glycol powder (GLYCOLAX/MIRALAX) 17 GM/SCOOP powder Take 1 Container by mouth daily as needed for mild constipation. 12/25/22  Yes [provider]  albuterol (VENTOLIN HFA) 108 (90 Base) MCG/ACT inhaler Inhale 2 puffs into the lungs every 6 (six) hours as needed for shortness of breath or wheezing. 12/07/15   [provider]  amLODipine (NORVASC) 10 MG tablet Take 1 tablet (10 mg total) by mouth daily. Patient not taking: Reported on 01/27/2023 05/30/16   Hower, Cletis Athens, MD  aspirin 81 MG chewable tablet Chew 81 mg by mouth daily. 06/03/20   [provider]  atorvastatin (LIPITOR) 40 MG tablet Take 1 tablet (40 mg total) by mouth daily. 08/14/22 08/14/23  Gillis Santa, MD  budesonide-formoterol [facility]) 160-4.5 MCG/ACT inhaler Inhale 2 puffs into the lungs daily. Patient not taking: Reported on 03/16/2022    [provider]  clopidogrel (PLAVIX)  75 MG tablet TAKE 1 TABLET BY MOUTH EVERY DAY 02/26/21   Schnier, Latina Craver, MD  ferrous sulfate 325 (65 FE) MG tablet Take 325 mg by mouth daily with breakfast. 02/11/20   [provider]  gabapentin (NEURONTIN) 300 MG capsule Take 300 mg by mouth at bedtime. 08/06/19   [provider]  lidocaine (LIDODERM) 5 % Place 1 patch onto the skin daily as needed (Pain). 06/30/20   [provider]  lidocaine (XYLOCAINE) 5 % ointment APPLY 1 APPLICATION TOPICALLY AS NEEDED. Patient not taking: Reported on 08/13/2022 11/23/20   Georgiana Spinner, NP  magnesium oxide (MAG-OX) 400 MG tablet Take 400 mg by mouth 2 (two) times daily. 09/01/20   [provider]  metFORMIN (GLUCOPHAGE) 850 MG tablet Take 850 mg by mouth daily with breakfast.     [provider]  metoprolol succinate (TOPROL-XL) 25 MG 24 hr tablet Take 25 mg by mouth daily. 08/02/19   [provider]  mirtazapine (REMERON) 15 MG tablet Take 15 mg by mouth at bedtime.    [provider]  ondansetron (ZOFRAN ODT) 4 MG disintegrating tablet Take 1 tablet (4 mg total) by mouth every 8 (eight) hours as needed for nausea or vomiting. Patient not taking: Reported on 08/13/2022 01/07/20   Sharman Cheek, MD  OneTouch Delica Lancets 33G MISC Apply topically. 05/26/20   [provider]  pantoprazole (PROTONIX) 40 MG tablet Take 40 mg by mouth daily. 01/15/20   [provider]  tamsulosin (FLOMAX) 0.4 MG CAPS capsule Take 0.4 mg by mouth daily. 08/03/22   [provider]  TRELEGY ELLIPTA 100-62.5-25 MCG/ACT AEPB Inhale 1 puff into the lungs 2 (two) times daily. 07/29/22   [provider]    Physical Exam: Vitals:   01/27/23 1415 01/27/23 1430 01/27/23 1445 01/27/23 1525  BP: 129/79 (!) 143/78 126/75 (!) 147/90  Pulse: 77 75 78 88  Resp: 14 13 18  (!) 23  Temp:      TempSrc:      SpO2: 94% 96% 96% 92%  Weight:      Height:       Physical Exam Vitals and  nursing note reviewed.  Constitutional:      General: He is not in acute distress.    Appearance: He is normal weight.  HENT:     Head: Normocephalic and atraumatic.     Mouth/Throat:     Mouth: Mucous membranes are moist.     Pharynx: Oropharynx is clear.  Eyes:     Conjunctiva/sclera: Conjunctivae normal.  Pupils: Pupils are equal, round, and reactive to light.  Cardiovascular:     Rate and Rhythm: Normal rate and regular rhythm.     Heart sounds: No murmur heard.    No gallop.  Pulmonary:     Effort: Pulmonary effort is normal. No respiratory distress.     Breath sounds: Normal breath sounds.  Musculoskeletal:     Right lower leg: No edema.     Left lower leg: No edema.  Skin:    General: Skin is warm and dry.  Neurological:     General: No focal deficit present.     Mental Status: He is alert and oriented to person, place, and time. Mental status is at baseline.  Psychiatric:        Mood and Affect: Mood normal.        Behavior: Behavior normal.    Data Reviewed:  CBC with WBC of 7.2, hemoglobin 8.8, MCV of 75 and platelets 159 CMP with sodium of 135, potassium 4.3, bicarb 27, glucose 137, BUN 18, creatinine 1.30, albumin 3.1, total protein 5.8 and GFR 56 Initial troponin 35 with flat trend to 42 Urinalysis with no abnormalities.  EKG personally reviewed sinus rhythm with rate of 75.  No ischemic appearing ST or T wave changes.  Compared to EKG obtained on 12/21/2022, no changes noted.  DG Chest 2 View  Result Date: 01/27/2023 CLINICAL DATA:  Dizziness and diaphoresis EXAM: CHEST - 2 VIEW COMPARISON:  Chest radiograph dated 12/21/2022 CT chest dated 08/13/2022 FINDINGS: Normal lung volumes. Bibasilar linear opacities. Left lower lobe rounded opacity in keeping with known elevation of the hemidiaphragm. No pleural effusion or pneumothorax. The heart size and mediastinal contours are within normal limits. No acute osseous abnormality. IMPRESSION: 1. Bibasilar linear  opacities in keeping with atelectasis versus scarring. 2. Rounded left lower lobe opacity in keeping with known elevation of the left hemidiaphragm, which may represent eventration or focal diaphragmatic hernia. Electronically Signed   By: Agustin Cree M.D.   On: 01/27/2023 12:16    There are no new results to review at this time.   Family Communication: No family at bedside Primary team communication: Discussed with Dr. Fuller Plan Thank you very much for involving [place] in the care of your patient.  Author: Verdene Lennert, MD 01/27/2023 3:32 PM  For on call review [email or web address].

## 2023-01-27 NOTE — ED Notes (Signed)
EDP back into room, at [facility] speaking with family.

## 2023-01-27 NOTE — ED Notes (Signed)
Hospitalist at BS

## 2023-01-27 NOTE — ED Notes (Signed)
EDP at [facility]. Rectal exam completed. RN/chaperone present. Hemoccult positive

## 2023-01-27 NOTE — ED Notes (Signed)
Pending d/c, waiting for AVS to be completed

## 2023-01-27 NOTE — ED Notes (Addendum)
Pt alert, NAD, calm, interactive, resps e/u, speech clear, resting comfortably, VSS.

## 2023-01-27 NOTE — ED Provider Notes (Addendum)
[facility] Provider Note    Event Date/Time   First MD Initiated Contact with Patient 01/27/23 1119     (approximate)   History   Dizziness   HPI  [name] is a 82 y.o. male who comes in with concerns for near syncopal episode.  Patient reports that he did not have breakfast or drink anything this morning other than some coffee.  He was going to go to [employer] but he plans got diverted and he was waiting in [place] parking lot for his friends.  He was sitting in the car with the air conditioner off when he started feel hot.  He stated that he tried the air conditioner on after about 30 minutes and in the car with that he felt worse and called EMS.  When EMS got there he was diaphoretic with weak pulses he was given 1.150 mL of fluid.  He reports since then he feels much better.  He reports history of near syncope in the past from dehydration.  He states that he also did start a new medication this morning.  He was unclear of the name but we were able to figure it out that it was isosorbide 30 mg daily.  He reports a little bit of left-sided chest discomfort that is very minimal 2 out of 10 but he states that he has had this previously.  He denies any shortness of breath.   Isosorbide 30mg  daily.   Physical Exam   Triage Vital Signs: ED Triage Vitals  Enc Vitals Group     BP      Pulse      Resp      Temp      Temp src      SpO2      Weight      Height      Head Circumference      Peak Flow      Pain Score      Pain Loc      Pain Edu?      Excl. in GC?     Most recent vital signs: Vitals:   01/27/23 1445 01/27/23 1525  BP: 126/75 (!) 147/90  Pulse: 78 88  Resp: 18 (!) 23  Temp:    SpO2: 96% 92%     General: Awake, no distress.  CV:  Good peripheral perfusion.  Resp:  Normal effort.  Abd:  No distention.  Other:     ED Results / Procedures / Treatments   Labs (all labs ordered are listed, but only abnormal results are  displayed) Labs Reviewed  CBC WITH DIFFERENTIAL/PLATELET - Abnormal; Notable for the following components:      Result Value   RBC 3.95 (*)    Hemoglobin 8.8 (*)    HCT 29.8 (*)    MCV 75.4 (*)    MCH 22.3 (*)    MCHC 29.5 (*)    RDW 16.9 (*)    All other components within normal limits  COMPREHENSIVE METABOLIC PANEL - Abnormal; Notable for the following components:   Glucose, Bld 137 (*)    Creatinine, Ser 1.30 (*)    Calcium 8.3 (*)    Total Protein 5.8 (*)    Albumin 3.1 (*)    GFR, Estimated 56 (*)    All other components within normal limits  URINALYSIS, ROUTINE W REFLEX MICROSCOPIC - Abnormal; Notable for the following components:   Color, Urine YELLOW (*)    APPearance CLEAR (*)  All other components within normal limits  TROPONIN I (HIGH SENSITIVITY) - Abnormal; Notable for the following components:   Troponin I (High Sensitivity) 35 (*)    All other components within normal limits  TROPONIN I (HIGH SENSITIVITY) - Abnormal; Notable for the following components:   Troponin I (High Sensitivity) 42 (*)    All other components within normal limits     EKG  My interpretation of EKG:  Sinus rhythm 75 without any ST elevation or T wave inversion, normal intervals  RADIOLOGY I have reviewed the xray personally and interpreted and patient has some known elevation left hemidiaphragm.   PROCEDURES:  Critical Care performed: No  Procedures   MEDICATIONS ORDERED IN ED: Medications - No data to display   IMPRESSION / MDM / ASSESSMENT AND PLAN / ED COURSE  I reviewed the triage vital signs and the nursing notes.   Patient's presentation is most consistent with acute presentation with potential threat to life or bodily function.   Patient comes in with presyncopal symptoms most likely related to not eating anything as well as related to taking Imdur for this first time and sitting in a hot car.  Patient after fluids now feels at his baseline self.  Blood work was  ordered to evaluate for electrolyte MIs, AKI, troponin ordered due to him having a little bit of left-sided chest pain.  He denies any other symptoms currently.  Discussed with radiology who does report that the diaphragm changes have been there previously.  He denies any abdominal pain.  CBC shows downtrending hemoglobin.  Rectal exam without any evidence of melena but patient does report that he has not been taking his iron.  His troponin is elevated at 35 but slightly uptrending to 42.  His creatinine is at baseline.  Urine is without evidence of UTI.  I initially admitted patient to the hospitalist but after further discussion with the hospitalist did discuss with the patient and he did not want to stay in the hospital and that was thought to be secondary more to his Imdur and dehydration.  I did discuss my concerns with the chest pain and him having an echocardiogram ordered 2 days ago for him but patient did not want to stay in the hospital   Reevaluated patient is ambulatory without symptoms I discussed with the elevated troponin with the hospitalist who thinks it is just demand from the Imdur and the dehydration and being in the hot car.  Does not feel like this is necessary  new echo given patient's had resolution of symptoms.  I discussed with patient if he felt comfortable discharge home he does state that he did feel comfortable and that if he develops any new chest pain or similar symptoms not in the above setting that he would definitely return to the ER but at this time he would prefer to go home anyways and will follow-up with his doctors outpatient.  Repeat abdominal exam his abdomen soft nontender he denies any chest pain, shortness of breath or additional symptoms The patient is on the cardiac monitor to evaluate for evidence of arrhythmia and/or significant heart rate changes.      FINAL CLINICAL IMPRESSION(S) / ED DIAGNOSES   Final diagnoses:  Dizziness  Anemia, unspecified  type     Rx / DC Orders   ED Discharge Orders     None        Note:  This document was prepared using Dragon voice recognition software and may include  unintentional dictation errors.   Concha Se, MD 01/27/23 1550    Concha Se, MD 01/27/23 [phone]

## 2023-01-27 NOTE — ED Notes (Signed)
Ambulated with walker, which is baseline, slow cautious steady gait with walker, denies pain, nausea, sob, dizziness or weakness.

## 2023-01-27 NOTE — ED Notes (Signed)
1 month ago, hgb was 10.6, now 8.8

## 2023-01-31 ENCOUNTER — Emergency Department
Admission: EM | Admit: 2023-01-31 | Discharge: 2023-01-31 | Disposition: A | Discharge status: Home / Self Care | Payer: 59 | Attending: Emergency Medicine | Admitting: Emergency Medicine

## 2023-01-31 ENCOUNTER — Other Ambulatory Visit: Payer: Self-pay

## 2023-01-31 DIAGNOSIS — I110 Hypertensive heart disease with heart failure: Secondary | ICD-10-CM | POA: Diagnosis not present

## 2023-01-31 DIAGNOSIS — R7989 Other specified abnormal findings of blood chemistry: Secondary | ICD-10-CM | POA: Diagnosis not present

## 2023-01-31 DIAGNOSIS — R55 Syncope and collapse: Secondary | ICD-10-CM | POA: Diagnosis present

## 2023-01-31 DIAGNOSIS — I2510 Atherosclerotic heart disease of native coronary artery without angina pectoris: Secondary | ICD-10-CM | POA: Insufficient documentation

## 2023-01-31 DIAGNOSIS — I509 Heart failure, unspecified: Secondary | ICD-10-CM | POA: Diagnosis not present

## 2023-01-31 LAB — BASIC METABOLIC PANEL
Anion gap: 8 (ref 5–15)
BUN: 20 mg/dL (ref 8–23)
CO2: 24 mmol/L (ref 22–32)
Calcium: 8.3 mg/dL — ABNORMAL LOW (ref 8.9–10.3)
Chloride: 101 mmol/L (ref 98–111)
Creatinine, Ser: 1.59 mg/dL — ABNORMAL HIGH (ref 0.61–1.24)
GFR, Estimated: 44 mL/min — ABNORMAL LOW (ref >60)
Glucose, Bld: 170 mg/dL — ABNORMAL HIGH (ref 70–99)
Potassium: 3.8 mmol/L (ref 3.5–5.1)
Sodium: 133 mmol/L — ABNORMAL LOW (ref 135–145)

## 2023-01-31 LAB — TROPONIN I (HIGH SENSITIVITY)
Troponin I (High Sensitivity): 47 ng/L — ABNORMAL HIGH (ref <18)
Troponin I (High Sensitivity): 49 ng/L — ABNORMAL HIGH (ref <18)

## 2023-01-31 LAB — CBC
HCT: 33.4 % — ABNORMAL LOW (ref 39.0–52.0)
Hemoglobin: 9.9 g/dL — ABNORMAL LOW (ref 13.0–17.0)
MCH: 22.2 pg — ABNORMAL LOW (ref 26.0–34.0)
MCHC: 29.6 g/dL — ABNORMAL LOW (ref 30.0–36.0)
MCV: 74.9 fL — ABNORMAL LOW (ref 80.0–100.0)
Platelets: 182 10*3/uL (ref 150–400)
RBC: 4.46 MIL/uL (ref 4.22–5.81)
RDW: 17 % — ABNORMAL HIGH (ref 11.5–15.5)
WBC: 9.2 10*3/uL (ref 4.0–10.5)
nRBC: 0 % (ref 0.0–0.2)

## 2023-01-31 LAB — CBG MONITORING, ED: Glucose-Capillary: 109 mg/dL — ABNORMAL HIGH (ref 70–99)

## 2023-01-31 MED ORDER — SODIUM CHLORIDE 0.9 % IV BOLUS
500.0000 mL | Freq: Once | INTRAVENOUS | Status: AC
  Administered 2023-01-31: 500 mL via INTRAVENOUS

## 2023-01-31 NOTE — ED Triage Notes (Signed)
Pt here with near syncope. Pt just started isosorbide and had a near syncopal last week and came to the ED 3 days later. Pt went for a follow up today and had another near syncopal episode. Orthostatic bp positive, 106/57-sitting.    74 97% RA 202-cbg 20G R hand-500cc NS given

## 2023-01-31 NOTE — ED Provider Notes (Signed)
[facility] Provider Note    Event Date/Time   First MD Initiated Contact with Patient 01/31/23 1411     (approximate)   History   Near Syncope   HPI  [name] is a 82 y.o. male   with history of CAD, CHF, HTN presenting to the emergency department for evaluation following a near syncopal episode. Patient was seen in our ER on 5/10 after a near syncopal event while sitting in his hot car after starting taking Imdur.  At that time, he had a slightly elevated troponin that was thought to be likely demand related and was discharged.  He was unsure if he should be continuing his medication, but has been taking it.  He followed up with his primary care doctor today.  There, he did have another near syncopal episode while attempting to stand.  He was given IV fluids and instructed to discontinue his Imdur, but did have some ongoing lightheadedness so he was directed to the ER for further evaluation.  He denies chest pain, shortness of breath.  Denies falls or head injury.      Physical Exam   Triage Vital Signs: ED Triage Vitals  Enc Vitals Group     BP 01/31/23 1338 (!) 146/85     Pulse Rate 01/31/23 1338 77     Resp 01/31/23 1338 16     Temp 01/31/23 1338 97.6 F (36.4 C)     Temp Source 01/31/23 1338 Oral     SpO2 01/31/23 1338 94 %     Weight 01/31/23 1342 166 lb 0.1 oz (75.3 kg)     Height 01/31/23 1342 5\' 7"  (1.702 m)     Head Circumference --      Peak Flow --      Pain Score 01/31/23 1342 8     Pain Loc --      Pain Edu? --      Excl. in GC? --     Most recent vital signs: Vitals:   01/31/23 1338  BP: (!) 146/85  Pulse: 77  Resp: 16  Temp: 97.6 F (36.4 C)  SpO2: 94%     General: Awake, interactive  CV:  Regular rate, good peripheral perfusion.  Resp:  Lungs clear, unlabored respirations.  Abd:  Soft, nondistended.  Neuro:  Symmetric facial movement, fluid speech   ED Results / Procedures / Treatments   Labs (all labs  ordered are listed, but only abnormal results are displayed) Labs Reviewed  BASIC METABOLIC PANEL - Abnormal; Notable for the following components:      Result Value   Sodium 133 (*)    Glucose, Bld 170 (*)    Creatinine, Ser 1.59 (*)    Calcium 8.3 (*)    GFR, Estimated 44 (*)    All other components within normal limits  CBC - Abnormal; Notable for the following components:   Hemoglobin 9.9 (*)    HCT 33.4 (*)    MCV 74.9 (*)    MCH 22.2 (*)    MCHC 29.6 (*)    RDW 17.0 (*)    All other components within normal limits  TROPONIN I (HIGH SENSITIVITY) - Abnormal; Notable for the following components:   Troponin I (High Sensitivity) 47 (*)    All other components within normal limits  URINALYSIS, ROUTINE W REFLEX MICROSCOPIC  CBG MONITORING, ED     EKG EKG independently reviewed interpreted by myself (ER attending) demonstrates:  EKG demonstrates normal sinus rhythm  at a rate of 74, PR 190, QRS 78, QTc 470  RADIOLOGY Imaging independently reviewed and interpreted by myself demonstrates:    PROCEDURES:  Critical Care performed: No  Procedures   MEDICATIONS ORDERED IN ED: Medications  sodium chloride 0.9 % bolus 500 mL (has no administration in time range)     IMPRESSION / MDM / ASSESSMENT AND PLAN / ED COURSE  I reviewed the triage vital signs and the nursing notes.  Differential diagnosis includes, but is not limited to, suspect vasovagal symptoms due to initiation of recent medication affecting blood pressure with associated orthostasis, consideration for cardiac syncope, anemia  Patient's presentation is most consistent with acute presentation with potential threat to life or bodily function.  82 year old male presenting with recurrent near syncope in the setting of recent initiation of Imdur use.  Here, patient did have positive orthostatic vitals.  He does not have chest pain or shortness of breath.  Will obtain repeat labs and assess.   Labs here notable for  slightly elevated troponin at 47, previously 42 4 days ago. Suspect that this is likely stable, but will obtain repeat troponin to ensure no significant increase.  If this is reassuring and patient without significant symptoms, do think it be reasonable to discharge him home with instructions to discontinue use of his Imdur.  At 1500, care of this patient was signed out to oncoming provider pending repeat troponin and disposition.      FINAL CLINICAL IMPRESSION(S) / ED DIAGNOSES   Final diagnoses:  Near syncope     Rx / DC Orders   ED Discharge Orders     None        Note:  This document was prepared using Dragon voice recognition software and may include unintentional dictation errors.   Trinna Post, MD 01/31/23 [phone]

## 2023-01-31 NOTE — ED Provider Notes (Signed)
Patient received in signout from Dr. Danie Binder pending follow-up troponin.  Patient remains hemodynamically stable.  Repeat troponin is equivocal.  Does appear stable and appropriate for outpatient follow-up.   Willy Eddy, MD 01/31/23 1640

## 2023-03-08 ENCOUNTER — Other Ambulatory Visit (INDEPENDENT_AMBULATORY_CARE_PROVIDER_SITE_OTHER): Payer: Self-pay | Admitting: Vascular Surgery

## 2023-03-08 DIAGNOSIS — I739 Peripheral vascular disease, unspecified: Secondary | ICD-10-CM

## 2023-03-12 NOTE — Progress Notes (Deleted)
MRN : [id number]  [name] is a 82 y.o. (07/02/1941) male who presents with chief complaint of check circulation.  History of Present Illness:   The patient returns to the office for followup and review status post angiogram with intervention 11/10/2020.    Procedure:  1.  Percutaneous transluminal angioplasty left superficial femoral and popliteal arteries to 5 mm with Lutonix drug-eluting balloon  2.  Percutaneous transluminal angioplasty left posterior tibial artery and tibioperoneal trunk to 4 mm with Lutonix drug-eluting balloon   The patient notes improvement in the lower extremity symptoms. No interval shortening of the patient's claudication distance or rest pain symptoms. Previous wounds have now healed.  No new ulcers or wounds have occurred since the last visit.   There have been no significant changes to the patient's overall health care.   The patient denies amaurosis fugax or recent TIA symptoms. There are no recent neurological changes noted. The patient denies history of DVT, PE or superficial thrombophlebitis. The patient denies recent episodes of angina or shortness of breath.    ABI's Rt=1.14 and Lt=1.28  (previous ABI's Rt=1.13 and Lt=1.17)  No outpatient medications have been marked as taking for the 03/13/23 encounter (Appointment) with Gilda Crease, Latina Craver, MD.    Past Medical History:  Diagnosis Date   Atypical chest pain    a. 05/2011 Neg MV; b. 04/2013 Neg MV; c. 04/2019 Echo: EF 55-60%, mild BAE. Degen MV dzs. Nl RV fxn; d. 06/2019 MV: EF 55%, small, mild, fixed apical and apical ant defect - probable artifact. No ischemia.   COPD (chronic obstructive pulmonary disease) (HCC)    Coronary artery calcification seen on CT scan    a. 07/2018 CT Chest - cor Ca2+.   Diabetes mellitus without complication (HCC)    Erosive gastropathy    a. 06/2019 EGD   GERD (gastroesophageal reflux disease)     Hypertension    Pulmonary nodules    a. 07/2018 Chest CT: RML and RLL nodules.   TIA (transient ischemic attack)     Past Surgical History:  Procedure Laterality Date   EYE SURGERY     LOWER EXTREMITY ANGIOGRAPHY Left 09/29/2020   Procedure: LOWER EXTREMITY ANGIOGRAPHY;  Surgeon: Renford Dills, MD;  Location: [facility] INVASIVE CV LAB;  Service: Cardiovascular;  Laterality: Left;   LOWER EXTREMITY ANGIOGRAPHY Left 11/10/2020   Procedure: LOWER EXTREMITY ANGIOGRAPHY;  Surgeon: Renford Dills, MD;  Location: [facility] INVASIVE CV LAB;  Service: Cardiovascular;  Laterality: Left;    Social History Social History   Tobacco Use   Smoking status: Every Day    Packs/day: 0.50    Years: 60.00    Additional pack years: 0.00    Total pack years: 30.00    Types: Cigarettes   Smokeless tobacco: Never   Tobacco comments:    still smoking 1/2 ppd.  Substance Use Topics   Alcohol use: Yes   Drug use: No    Family History Family History  Problem Relation Age of Onset   Diabetes Mellitus II Mother    Diabetes Mellitus II Father    Diabetes  Mellitus II Sister    Diabetes Mellitus II Brother     Allergies  Allergen Reactions   Lisinopril Swelling    Lip and check swelling   Aspirin     Held due to anemia and fall risk. (tolerates low dose aspirin)   Codeine Other (See Comments)    Sweaty   Ivp Dye [Iodinated Contrast Media] Hives and Other (See Comments)    dizzy   Percocet [Oxycodone-Acetaminophen] Hives and Nausea Only     REVIEW OF SYSTEMS (Negative unless checked)  Constitutional: [] Weight loss  [] Fever  [] Chills Cardiac: [] Chest pain   [] Chest pressure   [] Palpitations   [] Shortness of breath when laying flat   [] Shortness of breath with exertion. Vascular:  [x] Pain in legs with walking   [] Pain in legs at rest  [] History of DVT   [] Phlebitis   [] Swelling in legs   [] Varicose veins   [] Non-healing ulcers Pulmonary:   [] Uses home oxygen   [] Productive cough    [] Hemoptysis   [] Wheeze  [x] COPD   [] Asthma Neurologic:  [] Dizziness   [] Seizures   [] History of stroke   [] History of TIA  [] Aphasia   [] Vissual changes   [] Weakness or numbness in arm   [] Weakness or numbness in leg Musculoskeletal:   [] Joint swelling   [x] Joint pain   [x] Low back pain Hematologic:  [] Easy bruising  [] Easy bleeding   [] Hypercoagulable state   [] Anemic Gastrointestinal:  [] Diarrhea   [] Vomiting  [x] Gastroesophageal reflux/heartburn   [] Difficulty swallowing. Genitourinary:  [] Chronic kidney disease   [] Difficult urination  [] Frequent urination   [] Blood in urine Skin:  [] Rashes   [] Ulcers  Psychological:  [] History of anxiety   []  History of major depression.  Physical Examination  There were no vitals filed for this visit. There is no height or weight on file to calculate BMI. Gen: WD/WN, NAD Head: [place]/AT, No temporalis wasting.  Ear/Nose/Throat: Hearing grossly intact, nares w/o erythema or drainage Eyes: PER, EOMI, sclera nonicteric.  Neck: Supple, no masses.  No bruit or JVD.  Pulmonary:  Good air movement, no audible wheezing, no use of accessory muscles.  Cardiac: RRR, normal S1, S2, no Murmurs. Vascular:  mild trophic changes, no open wounds Vessel Right Left  Radial Palpable Palpable  PT Not Palpable Not Palpable  DP Not Palpable Not Palpable  Gastrointestinal: soft, non-distended. No guarding/no peritoneal signs.  Musculoskeletal: M/S 5/5 throughout.  No visible deformity.  Neurologic: CN 2-12 intact. Pain and light touch intact in extremities.  Symmetrical.  Speech is fluent. Motor exam as listed above. Psychiatric: Judgment intact, Mood & affect appropriate for pt's clinical situation. Dermatologic: No rashes or ulcers noted.  No changes consistent with cellulitis.   CBC Lab Results  Component Value Date   WBC 9.2 01/31/2023   HGB 9.9 (L) 01/31/2023   HCT 33.4 (L) 01/31/2023   MCV 74.9 (L) 01/31/2023   PLT 182 01/31/2023    BMET    Component  Value Date/Time   NA 133 (L) 01/31/2023 1351   NA 133 (L) 10/16/2013 0508   K 3.8 01/31/2023 1351   K 4.2 10/16/2013 0508   CL 101 01/31/2023 1351   CL 101 10/16/2013 0508   CO2 24 01/31/2023 1351   CO2 24 10/16/2013 0508   GLUCOSE 170 (H) 01/31/2023 1351   GLUCOSE 133 (H) 10/16/2013 0508   BUN 20 01/31/2023 1351   BUN 21 (H) 10/16/2013 0508   CREATININE 1.59 (H) 01/31/2023 1351   CREATININE 1.43 (H) 10/16/2013 1610  CALCIUM 8.3 (L) 01/31/2023 1351   CALCIUM 8.3 (L) 10/16/2013 0508   GFRNONAA 44 (L) 01/31/2023 1351   GFRNONAA 49 (L) 10/16/2013 0508   GFRAA >60 04/18/2020 0401   GFRAA 57 (L) 10/16/2013 0508   CrCl cannot be calculated (Patient's most recent lab result is older than the maximum 21 days allowed.).  COAG Lab Results  Component Value Date   INR 1.0 08/13/2022   INR 1.1 03/22/2020   INR 1.0 08/29/2019    Radiology No results found.   Assessment/Plan There are no diagnoses linked to this encounter.   Levora Dredge, MD  03/12/2023 5:11 PM

## 2023-03-13 ENCOUNTER — Encounter (INDEPENDENT_AMBULATORY_CARE_PROVIDER_SITE_OTHER): Payer: Medicare Other

## 2023-03-13 ENCOUNTER — Ambulatory Visit (INDEPENDENT_AMBULATORY_CARE_PROVIDER_SITE_OTHER): Payer: Medicare Other | Admitting: Vascular Surgery

## 2023-03-13 DIAGNOSIS — J449 Chronic obstructive pulmonary disease, unspecified: Secondary | ICD-10-CM

## 2023-03-13 DIAGNOSIS — I10 Essential (primary) hypertension: Secondary | ICD-10-CM

## 2023-03-13 DIAGNOSIS — I25118 Atherosclerotic heart disease of native coronary artery with other forms of angina pectoris: Secondary | ICD-10-CM

## 2023-03-13 DIAGNOSIS — I70213 Atherosclerosis of native arteries of extremities with intermittent claudication, bilateral legs: Secondary | ICD-10-CM

## 2023-03-13 DIAGNOSIS — I6523 Occlusion and stenosis of bilateral carotid arteries: Secondary | ICD-10-CM

## 2023-04-26 NOTE — Progress Notes (Deleted)
MRN : [id number]  [name] is a 82 y.o. ([birth year]) male who presents with chief complaint of check circulation.  History of Present Illness:   The patient returns to the office for followup and review status post angiogram with intervention 11/10/2020.    Procedure:  1.  Percutaneous transluminal angioplasty left superficial femoral and popliteal arteries to 5 mm with Lutonix drug-eluting balloon  2.  Percutaneous transluminal angioplasty left posterior tibial artery and tibioperoneal trunk to 4 mm with Lutonix drug-eluting balloon   The patient notes improvement in the lower extremity symptoms. No interval shortening of the patient's claudication distance or rest pain symptoms. Previous wounds have now healed.  No new ulcers or wounds have occurred since the last visit.   There have been no significant changes to the patient's overall health care.   The patient denies amaurosis fugax or recent TIA symptoms. There are no recent neurological changes noted. The patient denies history of DVT, PE or superficial thrombophlebitis. The patient denies recent episodes of angina or shortness of breath.    ABI's Rt=1.14 and Lt=1.28  (previous ABI's Rt=1.13 and Lt=1.17)  No outpatient medications have been marked as taking for the 04/27/23 encounter (Appointment) with Gilda Crease, Latina Craver, MD.    Past Medical History:  Diagnosis Date   Atypical chest pain    a. 05/2011 Neg MV; b. 04/2013 Neg MV; c. 04/2019 Echo: EF 55-60%, mild BAE. Degen MV dzs. Nl RV fxn; d. 06/2019 MV: EF 55%, small, mild, fixed apical and apical ant defect - probable artifact. No ischemia.   COPD (chronic obstructive pulmonary disease) (HCC)    Coronary artery calcification seen on CT scan    a. 07/2018 CT Chest - cor Ca2+.   Diabetes mellitus without complication (HCC)    Erosive gastropathy    a. 06/2019 EGD   GERD (gastroesophageal reflux disease)     Hypertension    Pulmonary nodules    a. 07/2018 Chest CT: RML and RLL nodules.   TIA (transient ischemic attack)     Past Surgical History:  Procedure Laterality Date   EYE SURGERY     LOWER EXTREMITY ANGIOGRAPHY Left 09/29/2020   Procedure: LOWER EXTREMITY ANGIOGRAPHY;  Surgeon: Renford Dills, MD;  Location: [facility];  Service: Cardiovascular;  Laterality: Left;   LOWER EXTREMITY ANGIOGRAPHY Left 11/10/2020   Procedure: LOWER EXTREMITY ANGIOGRAPHY;  Surgeon: Renford Dills, MD;  Location: [facility];  Service: Cardiovascular;  Laterality: Left;    Social History Social History   Tobacco Use   Smoking status: Every Day    Current packs/day: 0.50    Average packs/day: 0.5 packs/day for 60.0 years (30.0 ttl pk-yrs)    Types: Cigarettes   Smokeless tobacco: Never   Tobacco comments:    still smoking 1/2 ppd.  Substance Use Topics   Alcohol use: Yes   Drug use: No    Family History Family History  Problem Relation Age of Onset   Diabetes Mellitus II Mother    Diabetes Mellitus II Father    Diabetes Mellitus II Sister  Diabetes Mellitus II Brother     Allergies  Allergen Reactions   Lisinopril Swelling    Lip and check swelling   Aspirin     Held due to anemia and fall risk. (tolerates low dose aspirin)   Codeine Other (See Comments)    Sweaty   Ivp Dye [Iodinated Contrast Media] Hives and Other (See Comments)    dizzy   Percocet [Oxycodone-Acetaminophen] Hives and Nausea Only     REVIEW OF SYSTEMS (Negative unless checked)  Constitutional: [] Weight loss  [] Fever  [] Chills Cardiac: [] Chest pain   [] Chest pressure   [] Palpitations   [] Shortness of breath when laying flat   [] Shortness of breath with exertion. Vascular:  [x] Pain in legs with walking   [] Pain in legs at rest  [] History of DVT   [] Phlebitis   [] Swelling in legs   [] Varicose veins   [] Non-healing ulcers Pulmonary:   [] Uses home oxygen   [] Productive cough    [] Hemoptysis   [] Wheeze  [] COPD   [] Asthma Neurologic:  [] Dizziness   [] Seizures   [] History of stroke   [] History of TIA  [] Aphasia   [] Vissual changes   [] Weakness or numbness in arm   [] Weakness or numbness in leg Musculoskeletal:   [] Joint swelling   [] Joint pain   [] Low back pain Hematologic:  [] Easy bruising  [] Easy bleeding   [] Hypercoagulable state   [] Anemic Gastrointestinal:  [] Diarrhea   [] Vomiting  [x] Gastroesophageal reflux/heartburn   [] Difficulty swallowing. Genitourinary:  [] Chronic kidney disease   [] Difficult urination  [] Frequent urination   [] Blood in urine Skin:  [] Rashes   [] Ulcers  Psychological:  [] History of anxiety   []  History of major depression.  Physical Examination  There were no vitals filed for this visit. There is no height or weight on file to calculate BMI. Gen: WD/WN, NAD Head: [place]/AT, No temporalis wasting.  Ear/Nose/Throat: Hearing grossly intact, nares w/o erythema or drainage Eyes: PER, EOMI, sclera nonicteric.  Neck: Supple, no masses.  No bruit or JVD.  Pulmonary:  Good air movement, no audible wheezing, no use of accessory muscles.  Cardiac: RRR, normal S1, S2, no Murmurs. Vascular:  mild trophic changes, no open wounds Vessel Right Left  Radial Palpable Palpable  PT Not Palpable Not Palpable  DP Not Palpable Not Palpable  Gastrointestinal: soft, non-distended. No guarding/no peritoneal signs.  Musculoskeletal: M/S 5/5 throughout.  No visible deformity.  Neurologic: CN 2-12 intact. Pain and light touch intact in extremities.  Symmetrical.  Speech is fluent. Motor exam as listed above. Psychiatric: Judgment intact, Mood & affect appropriate for pt's clinical situation. Dermatologic: No rashes or ulcers noted.  No changes consistent with cellulitis.   CBC Lab Results  Component Value Date   WBC 9.2 01/31/2023   HGB 9.9 (L) 01/31/2023   HCT 33.4 (L) 01/31/2023   MCV 74.9 (L) 01/31/2023   PLT 182 01/31/2023    BMET    Component Value  Date/Time   NA 133 (L) 01/31/2023 1351   NA 133 (L) 10/16/2013 0508   K 3.8 01/31/2023 1351   K 4.2 10/16/2013 0508   CL 101 01/31/2023 1351   CL 101 10/16/2013 0508   CO2 24 01/31/2023 1351   CO2 24 10/16/2013 0508   GLUCOSE 170 (H) 01/31/2023 1351   GLUCOSE 133 (H) 10/16/2013 0508   BUN 20 01/31/2023 1351   BUN 21 (H) 10/16/2013 0508   CREATININE 1.59 (H) 01/31/2023 1351   CREATININE 1.43 (H) 10/16/2013 0508   CALCIUM 8.3 (L) 01/31/2023  1351   CALCIUM 8.3 (L) 10/16/2013 0508   GFRNONAA 44 (L) 01/31/2023 1351   GFRNONAA 49 (L) 10/16/2013 0508   GFRAA >60 04/18/2020 0401   GFRAA 57 (L) 10/16/2013 0508   CrCl cannot be calculated (Patient's most recent lab result is older than the maximum 21 days allowed.).  COAG Lab Results  Component Value Date   INR 1.0 08/13/2022   INR 1.1 03/22/2020   INR 1.0 08/29/2019    Radiology No results found.   Assessment/Plan There are no diagnoses linked to this encounter.   Levora Dredge, MD  04/26/2023 10:59 AM

## 2023-04-27 ENCOUNTER — Ambulatory Visit (INDEPENDENT_AMBULATORY_CARE_PROVIDER_SITE_OTHER): Payer: Medicare Other | Admitting: Vascular Surgery

## 2023-04-27 ENCOUNTER — Encounter (INDEPENDENT_AMBULATORY_CARE_PROVIDER_SITE_OTHER): Payer: Medicare Other

## 2023-04-27 DIAGNOSIS — I6523 Occlusion and stenosis of bilateral carotid arteries: Secondary | ICD-10-CM

## 2023-04-27 DIAGNOSIS — I10 Essential (primary) hypertension: Secondary | ICD-10-CM

## 2023-04-27 DIAGNOSIS — I70213 Atherosclerosis of native arteries of extremities with intermittent claudication, bilateral legs: Secondary | ICD-10-CM

## 2023-04-27 DIAGNOSIS — J449 Chronic obstructive pulmonary disease, unspecified: Secondary | ICD-10-CM

## 2023-04-27 DIAGNOSIS — I25118 Atherosclerotic heart disease of native coronary artery with other forms of angina pectoris: Secondary | ICD-10-CM

## 2023-05-29 ENCOUNTER — Encounter (INDEPENDENT_AMBULATORY_CARE_PROVIDER_SITE_OTHER): Payer: Self-pay | Admitting: Vascular Surgery

## 2023-05-29 ENCOUNTER — Ambulatory Visit (INDEPENDENT_AMBULATORY_CARE_PROVIDER_SITE_OTHER): Payer: 59 | Admitting: Vascular Surgery

## 2023-05-29 ENCOUNTER — Ambulatory Visit (INDEPENDENT_AMBULATORY_CARE_PROVIDER_SITE_OTHER): Payer: 59

## 2023-05-29 VITALS — BP 132/73 | HR 84 | Resp 18 | Ht 67.0 in | Wt 162.4 lb

## 2023-05-29 DIAGNOSIS — I6523 Occlusion and stenosis of bilateral carotid arteries: Secondary | ICD-10-CM

## 2023-05-29 DIAGNOSIS — I739 Peripheral vascular disease, unspecified: Secondary | ICD-10-CM

## 2023-05-29 DIAGNOSIS — I10 Essential (primary) hypertension: Secondary | ICD-10-CM

## 2023-05-29 DIAGNOSIS — I70213 Atherosclerosis of native arteries of extremities with intermittent claudication, bilateral legs: Secondary | ICD-10-CM

## 2023-05-29 DIAGNOSIS — J449 Chronic obstructive pulmonary disease, unspecified: Secondary | ICD-10-CM

## 2023-05-29 DIAGNOSIS — I25118 Atherosclerotic heart disease of native coronary artery with other forms of angina pectoris: Secondary | ICD-10-CM | POA: Diagnosis not present

## 2023-05-29 LAB — VAS US ABI WITH/WO TBI
Left ABI: 1.21
Right ABI: 1.04

## 2023-05-29 NOTE — Progress Notes (Signed)
MRN : [id number]  [name] is a 82 y.o. (1941/09/17) male who presents with chief complaint of check circulation.  History of Present Illness:   The patient returns to the office for followup and review status post angiogram with intervention 11/10/2020.    Procedure:  1.  Percutaneous transluminal angioplasty left superficial femoral and popliteal arteries to 5 mm with Lutonix drug-eluting balloon  2.  Percutaneous transluminal angioplasty left posterior tibial artery and tibioperoneal trunk to 4 mm with Lutonix drug-eluting balloon   The patient notes improvement in the lower extremity symptoms. No interval shortening of the patient's claudication distance or rest pain symptoms. Previous wounds have now healed.  No new ulcers or wounds have occurred since the last visit.   There have been no significant changes to the patient's overall health care.   The patient denies amaurosis fugax or recent TIA symptoms. There are no recent neurological changes noted. The patient denies history of DVT, PE or superficial thrombophlebitis. The patient denies recent episodes of angina or shortness of breath.    ABI's Rt=1.04 (triphasic) and Lt=1.21 (triphasic) (previous ABI's Rt=1.14 (triphasic) and Lt=1.28 (triphasic))     No outpatient medications have been marked as taking for the 05/29/23 encounter (Appointment) with Gilda Crease, Latina Craver, MD.    Past Medical History:  Diagnosis Date   Atypical chest pain    a. 05/2011 Neg MV; b. 04/2013 Neg MV; c. 04/2019 Echo: EF 55-60%, mild BAE. Degen MV dzs. Nl RV fxn; d. 06/2019 MV: EF 55%, small, mild, fixed apical and apical ant defect - probable artifact. No ischemia.   COPD (chronic obstructive pulmonary disease) (HCC)    Coronary artery calcification seen on CT scan    a. 07/2018 CT Chest - cor Ca2+.   Diabetes mellitus without complication (HCC)    Erosive gastropathy    a.  06/2019 EGD   GERD (gastroesophageal reflux disease)    Hypertension    Pulmonary nodules    a. 07/2018 Chest CT: RML and RLL nodules.   TIA (transient ischemic attack)     Past Surgical History:  Procedure Laterality Date   EYE SURGERY     LOWER EXTREMITY ANGIOGRAPHY Left 09/29/2020   Procedure: LOWER EXTREMITY ANGIOGRAPHY;  Surgeon: Renford Dills, MD;  Location: [facility];  Service: Cardiovascular;  Laterality: Left;   LOWER EXTREMITY ANGIOGRAPHY Left 11/10/2020   Procedure: LOWER EXTREMITY ANGIOGRAPHY;  Surgeon: Renford Dills, MD;  Location: [facility];  Service: Cardiovascular;  Laterality: Left;    Social History Social History   Tobacco Use   Smoking status: Every Day    Current packs/day: 0.50    Average packs/day: 0.5 packs/day for 60.0 years (30.0 ttl pk-yrs)    Types: Cigarettes   Smokeless tobacco: Never   Tobacco comments:    still smoking 1/2 ppd.  Substance Use Topics   Alcohol use: Yes   Drug use: No    Family History Family History  Problem Relation Age of Onset   Diabetes Mellitus II Mother    Diabetes Mellitus II Father  Diabetes Mellitus II Sister    Diabetes Mellitus II Brother     Allergies  Allergen Reactions   Lisinopril Swelling    Lip and check swelling   Aspirin     Held due to anemia and fall risk. (tolerates low dose aspirin)   Codeine Other (See Comments)    Sweaty   Ivp Dye [Iodinated Contrast Media] Hives and Other (See Comments)    dizzy   Percocet [Oxycodone-Acetaminophen] Hives and Nausea Only     REVIEW OF SYSTEMS (Negative unless checked)  Constitutional: [] Weight loss  [] Fever  [] Chills Cardiac: [] Chest pain   [] Chest pressure   [] Palpitations   [] Shortness of breath when laying flat   [] Shortness of breath with exertion. Vascular:  [x] Pain in legs with walking   [] Pain in legs at rest  [] History of DVT   [] Phlebitis   [] Swelling in legs   [] Varicose veins   [] Non-healing ulcers Pulmonary:    [] Uses home oxygen   [] Productive cough   [] Hemoptysis   [] Wheeze  [x] COPD   [] Asthma Neurologic:  [] Dizziness   [] Seizures   [] History of stroke   [] History of TIA  [] Aphasia   [] Vissual changes   [] Weakness or numbness in arm   [x] Weakness or numbness in leg Musculoskeletal:   [] Joint swelling   [x] Joint pain   [x] Low back pain Hematologic:  [] Easy bruising  [] Easy bleeding   [] Hypercoagulable state   [] Anemic Gastrointestinal:  [] Diarrhea   [] Vomiting  [x] Gastroesophageal reflux/heartburn   [] Difficulty swallowing. Genitourinary:  [] Chronic kidney disease   [] Difficult urination  [] Frequent urination   [] Blood in urine Skin:  [] Rashes   [] Ulcers  Psychological:  [x] History of anxiety   []  History of major depression.  Physical Examination  There were no vitals filed for this visit. There is no height or weight on file to calculate BMI. Gen: WD/WN, NAD Head: [place]/AT, No temporalis wasting.  Ear/Nose/Throat: Hearing grossly intact, nares w/o erythema or drainage Eyes: PER, EOMI, sclera nonicteric.  Neck: Supple, no masses.  No bruit or JVD.  Pulmonary:  Good air movement, no audible wheezing, no use of accessory muscles.  Cardiac: RRR, normal S1, S2, no Murmurs. Vascular:  mild trophic changes, no open wounds Vessel Right Left  Radial Palpable Palpable  PT Not Palpable Not Palpable  DP Not Palpable Not Palpable  Gastrointestinal: soft, non-distended. No guarding/no peritoneal signs.  Musculoskeletal: M/S 5/5 throughout.  No visible deformity.  Neurologic: CN 2-12 intact. Pain and light touch intact in extremities.  Symmetrical.  Speech is fluent. Motor exam as listed above. Psychiatric: Judgment intact, Mood & affect appropriate for pt's clinical situation. Dermatologic: No rashes or ulcers noted.  No changes consistent with cellulitis.   CBC Lab Results  Component Value Date   WBC 9.2 01/31/2023   HGB 9.9 (L) 01/31/2023   HCT 33.4 (L) 01/31/2023   MCV 74.9 (L) 01/31/2023    PLT 182 01/31/2023    BMET    Component Value Date/Time   NA 133 (L) 01/31/2023 1351   NA 133 (L) 10/16/2013 0508   K 3.8 01/31/2023 1351   K 4.2 10/16/2013 0508   CL 101 01/31/2023 1351   CL 101 10/16/2013 0508   CO2 24 01/31/2023 1351   CO2 24 10/16/2013 0508   GLUCOSE 170 (H) 01/31/2023 1351   GLUCOSE 133 (H) 10/16/2013 0508   BUN 20 01/31/2023 1351   BUN 21 (H) 10/16/2013 0508   CREATININE 1.59 (H) 01/31/2023 1351   CREATININE 1.43 (H) 10/16/2013  9562   CALCIUM 8.3 (L) 01/31/2023 1351   CALCIUM 8.3 (L) 10/16/2013 0508   GFRNONAA 44 (L) 01/31/2023 1351   GFRNONAA 49 (L) 10/16/2013 0508   GFRAA >60 04/18/2020 0401   GFRAA 57 (L) 10/16/2013 0508   CrCl cannot be calculated (Patient's most recent lab result is older than the maximum 21 days allowed.).  COAG Lab Results  Component Value Date   INR 1.0 08/13/2022   INR 1.1 03/22/2020   INR 1.0 08/29/2019    Radiology No results found.   Assessment/Plan 1. Atherosclerosis of native artery of both lower extremities with intermittent claudication (HCC) Recommend:   The patient has evidence of atherosclerosis of the lower extremities with claudication.  The patient does not voice lifestyle limiting changes at this point in time.   Noninvasive studies do not suggest clinically significant change.   No invasive studies, angiography or surgery at this time The patient should continue walking and begin a more formal exercise program.  The patient should continue antiplatelet therapy and aggressive treatment of the lipid abnormalities   No changes in the patient's medications at this time   The patient should continue wearing graduated compression socks 10-15 mmHg strength to control the mild edema.   - VAS [place] ABI WITH/WO TBI; Future  2. Bilateral carotid artery stenosis Recommend:   Given the patient's asymptomatic subcritical stenosis no further invasive testing or surgery at this time.   Duplex ultrasound shows  <40% stenosis bilaterally.   Continue antiplatelet therapy as prescribed Continue management of CAD, HTN and Hyperlipidemia Healthy heart diet,  encouraged exercise at least 4 times per week Follow up in 24 months with duplex ultrasound and physical exam.  3. Coronary artery disease of native artery of native heart with stable angina pectoris (HCC) Continue cardiac and antihypertensive medications as already ordered and reviewed, no changes at this time.  Continue statin as ordered and reviewed, no changes at this time  Nitrates PRN for chest pain  4. Primary hypertension Continue antihypertensive medications as already ordered, these medications have been reviewed and there are no changes at this time.  5. Chronic obstructive pulmonary disease, unspecified COPD type (HCC) Continue pulmonary medications and aerosols as already ordered, these medications have been reviewed and there are no changes at this time.     Levora Dredge, MD  05/29/2023 10:45 AM

## 2023-07-27 ENCOUNTER — Other Ambulatory Visit: Payer: Self-pay

## 2023-07-27 ENCOUNTER — Emergency Department: Payer: 59

## 2023-07-27 ENCOUNTER — Emergency Department
Admission: EM | Admit: 2023-07-27 | Discharge: 2023-07-27 | Disposition: A | Discharge status: Home / Self Care | Payer: 59 | Attending: Emergency Medicine | Admitting: Emergency Medicine

## 2023-07-27 ENCOUNTER — Encounter: Payer: Self-pay | Admitting: Emergency Medicine

## 2023-07-27 DIAGNOSIS — E1165 Type 2 diabetes mellitus with hyperglycemia: Secondary | ICD-10-CM | POA: Insufficient documentation

## 2023-07-27 DIAGNOSIS — R531 Weakness: Secondary | ICD-10-CM | POA: Diagnosis present

## 2023-07-27 DIAGNOSIS — Z79899 Other long term (current) drug therapy: Secondary | ICD-10-CM | POA: Insufficient documentation

## 2023-07-27 DIAGNOSIS — I110 Hypertensive heart disease with heart failure: Secondary | ICD-10-CM | POA: Insufficient documentation

## 2023-07-27 DIAGNOSIS — R55 Syncope and collapse: Secondary | ICD-10-CM | POA: Insufficient documentation

## 2023-07-27 DIAGNOSIS — E860 Dehydration: Secondary | ICD-10-CM

## 2023-07-27 DIAGNOSIS — I509 Heart failure, unspecified: Secondary | ICD-10-CM | POA: Insufficient documentation

## 2023-07-27 DIAGNOSIS — D649 Anemia, unspecified: Secondary | ICD-10-CM | POA: Insufficient documentation

## 2023-07-27 LAB — CBC
HCT: 39.3 % (ref 39.0–52.0)
Hemoglobin: 12.3 g/dL — ABNORMAL LOW (ref 13.0–17.0)
MCH: 26.2 pg (ref 26.0–34.0)
MCHC: 31.3 g/dL (ref 30.0–36.0)
MCV: 83.8 fL (ref 80.0–100.0)
Platelets: 144 10*3/uL — ABNORMAL LOW (ref 150–400)
RBC: 4.69 MIL/uL (ref 4.22–5.81)
RDW: 15.6 % — ABNORMAL HIGH (ref 11.5–15.5)
WBC: 8.1 10*3/uL (ref 4.0–10.5)
nRBC: 0 % (ref 0.0–0.2)

## 2023-07-27 LAB — BASIC METABOLIC PANEL
Anion gap: 7 (ref 5–15)
BUN: 19 mg/dL (ref 8–23)
CO2: 25 mmol/L (ref 22–32)
Calcium: 8 mg/dL — ABNORMAL LOW (ref 8.9–10.3)
Chloride: 100 mmol/L (ref 98–111)
Creatinine, Ser: 1.32 mg/dL — ABNORMAL HIGH (ref 0.61–1.24)
GFR, Estimated: 54 mL/min — ABNORMAL LOW (ref >60)
Glucose, Bld: 190 mg/dL — ABNORMAL HIGH (ref 70–99)
Potassium: 3.6 mmol/L (ref 3.5–5.1)
Sodium: 132 mmol/L — ABNORMAL LOW (ref 135–145)

## 2023-07-27 LAB — URINALYSIS, ROUTINE W REFLEX MICROSCOPIC
Bilirubin Urine: NEGATIVE
Glucose, UA: NEGATIVE mg/dL
Hgb urine dipstick: NEGATIVE
Ketones, ur: NEGATIVE mg/dL
Nitrite: NEGATIVE
Protein, ur: 30 mg/dL — AB
Specific Gravity, Urine: 1.016 (ref 1.005–1.030)
Squamous Epithelial / HPF: 0 /[HPF] (ref 0–5)
pH: 6 (ref 5.0–8.0)

## 2023-07-27 LAB — TROPONIN I (HIGH SENSITIVITY)
Troponin I (High Sensitivity): 34 ng/L — ABNORMAL HIGH (ref <18)
Troponin I (High Sensitivity): 36 ng/L — ABNORMAL HIGH (ref <18)

## 2023-07-27 LAB — CBG MONITORING, ED: Glucose-Capillary: 225 mg/dL — ABNORMAL HIGH (ref 70–99)

## 2023-07-27 NOTE — ED Triage Notes (Signed)
Patient to ED via [facility] from home for generalized weakness/dizziness. Patient's BP was initially 80/60 and 145/71 after of fluids. CBG reading high.   95% RA 801 HR 97.8

## 2023-07-27 NOTE — ED Provider Notes (Signed)
Near syncopal episode, sat up and stood, felt lightheaded. H/o same. Pt was given 500 cc of fluid for BP 80s, BP now 130s and feels well. Labs overall reassuring. EKG nonischemic but does show some signs of LVH. Trops pending. Glu 220s no signs of DKA on BMP.   Troponin negative x 2.  Patient has had a history of similar presentations in the past.  He is well-appearing.  Amateur without difficulty.  Discharge home.   Shaune Pollack, MD 07/27/23 [phone]

## 2023-07-27 NOTE — ED Provider Notes (Signed)
[facility] Provider Note    Event Date/Time   First MD Initiated Contact with Patient 07/27/23 1344     (approximate)   History   Weakness   HPI  [name] is a 82 y.o. male history of coronary disease congestive heart failure and hypertension  Patient reports he was at home he was in his normal health.  He had gotten up for a while, and had not had a breakfast.  He sat down to have breakfast and was eating cereal.  He started to feel very lightheaded.  He reports this happened to him "many times" and he recognized that he might pass out.  He feels like it is from "dehydration. '  There is no pain no chest pain no headache no nausea or vomiting.  No trouble breathing.  He felt very lightheaded, when EMS arrived his blood pressure was initially in the 80s systolic.  After receiving approximately 500 mL of normal saline his blood pressure has normalized.  Patient reports that he feels perfectly fine now his symptoms have resolved.  He is compliant with his diabetes medications.  EMS reports their initial blood sugar reading was "high".  Of note on arrival to the ER his blood sugars in the mid 200s.       Physical Exam   Triage Vital Signs: ED Triage Vitals  Encounter Vitals Group     BP 07/27/23 1347 139/79     Systolic BP Percentile --      Diastolic BP Percentile --      Pulse Rate 07/27/23 1347 83     Resp 07/27/23 1347 18     Temp 07/27/23 1347 98.5 F (36.9 C)     Temp Source 07/27/23 1347 Oral     SpO2 07/27/23 1347 94 %     Weight 07/27/23 1346 165 lb (74.8 kg)     Height 07/27/23 1346 5\' 7"  (1.702 m)     Head Circumference --      Peak Flow --      Pain Score 07/27/23 1346 0     Pain Loc --      Pain Education --      Exclude from Growth Chart --     Most recent vital signs: Vitals:   07/27/23 1347  BP: 139/79  Pulse: 83  Resp: 18  Temp: 98.5 F (36.9 C)  SpO2: 94%     General: Awake, no distress.  Fully alert and  oriented.  Normal mucous membranes.  Normal facial expressions.  Normal extraocular movements.  He moves all extremities with 5 out of 5 strength with clear speech and no obvious deficits CV:  Good peripheral perfusion.  Normal tones and rate Resp:  Normal effort.  Clear bilateral Abd:  No distention.  Soft nontender nondistended Other:  Warm well-perfused extremities throughout  Very questionable if he had slight Rales in the right lung base on exam, but the patient denies any cough shortness of breath or respiratory symptoms.  Chest x-ray performed  ED Results / Procedures / Treatments   Labs (all labs ordered are listed, but only abnormal results are displayed) Labs Reviewed  CBC - Abnormal; Notable for the following components:      Result Value   Hemoglobin 12.3 (*)    RDW 15.6 (*)    Platelets 144 (*)    All other components within normal limits  BASIC METABOLIC PANEL - Abnormal; Notable for the following components:   Sodium 132 (*)  Glucose, Bld 190 (*)    Creatinine, Ser 1.32 (*)    Calcium 8.0 (*)    GFR, Estimated 54 (*)    All other components within normal limits  CBG MONITORING, ED - Abnormal; Notable for the following components:   Glucose-Capillary 225 (*)    All other components within normal limits  URINALYSIS, ROUTINE W REFLEX MICROSCOPIC  TROPONIN I (HIGH SENSITIVITY)     EKG  And interpreted by me at 1350 heart rate 80 QRS 90 QTc 480 Normal sinus rhythm.  Mild lateral ST segment depression, suspect likely due to repolarization abnormality.  Compared with previous today's ST abnormality is slightly more pronounced.  He does not have any associated chest pain however.  Will check troponin x 2 nonetheless given the near syncopal episode though I doubt cardiac disease   RADIOLOGY    Chest x-ray pending final read by radiology.  To my interpretation I do not see any obvious acute finding or infiltrate.   PROCEDURES:  Critical Care performed:  No  Procedures   MEDICATIONS ORDERED IN ED: Medications - No data to display   IMPRESSION / MDM / ASSESSMENT AND PLAN / ED COURSE  I reviewed the triage vital signs and the nursing notes.                              Differential diagnosis includes, but is not limited to, vasovagal, orthostatic, mild dehydration, hyperglycemia.  With no associated neurologic features or abnormality on exam I doubt central neurologic cause.  He has no headache no numbness no tingling no weakness.  Additionally he does not have any associated chest pain though his ECG does show slight ST segment abnormality in the lateral leads which I am suspicious is chronic versus repolarization like however will check troponin x 2.  After receiving IV fluids with EMS his symptoms resolved hypotension resolved and he reports similar symptoms in the past.  He has been seen for near syncope prior.  Today's very examination is very reassuring  CBC shows very mild anemia.  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  Patient's presentation is most consistent with acute complicated illness / injury requiring diagnostic workup.   The patient is on the cardiac monitor to evaluate for evidence of arrhythmia and/or significant heart rate changes.  Patient now awake alert well-oriented.  Lab work reassuring.  Awaiting troponin #1 and #2.  If troponin values are normal or reassuring, anticipate likely discharge to home if he continues to feel well/asymptomatic  Ongoing care assigned to Dr. Erma Heritage     FINAL CLINICAL IMPRESSION(S) / ED DIAGNOSES   Final diagnoses:  Near syncope     Rx / DC Orders   ED Discharge Orders     None        Note:  This document was prepared using Dragon voice recognition software and may include unintentional dictation errors.   Sharyn Creamer, MD 07/27/23 [phone]

## 2023-07-27 NOTE — ED Notes (Signed)
 Patient discharged from ED by provider. Discharge instructions reviewed with patient and all questions answered. Patient wheeled from ED in NAD.

## 2023-09-26 ENCOUNTER — Ambulatory Visit
Admission: RE | Admit: 2023-09-26 | Discharge: 2023-09-26 | Disposition: A | Discharge status: Home / Self Care | Payer: 59 | Attending: Internal Medicine | Admitting: Internal Medicine

## 2023-09-26 ENCOUNTER — Other Ambulatory Visit: Payer: Self-pay | Admitting: Internal Medicine

## 2023-09-26 ENCOUNTER — Ambulatory Visit
Admission: RE | Admit: 2023-09-26 | Discharge: 2023-09-26 | Disposition: A | Discharge status: Home / Self Care | Payer: 59 | Source: Ambulatory Visit | Attending: Internal Medicine | Admitting: Internal Medicine

## 2023-09-26 DIAGNOSIS — R109 Unspecified abdominal pain: Secondary | ICD-10-CM | POA: Insufficient documentation

## 2023-10-30 ENCOUNTER — Other Ambulatory Visit: Payer: Self-pay

## 2023-10-30 ENCOUNTER — Emergency Department
Admission: EM | Admit: 2023-10-30 | Discharge: 2023-10-30 | Disposition: A | Discharge status: Home / Self Care | Payer: 59 | Attending: Emergency Medicine | Admitting: Emergency Medicine

## 2023-10-30 DIAGNOSIS — I10 Essential (primary) hypertension: Secondary | ICD-10-CM | POA: Insufficient documentation

## 2023-10-30 DIAGNOSIS — R55 Syncope and collapse: Secondary | ICD-10-CM | POA: Insufficient documentation

## 2023-10-30 DIAGNOSIS — J449 Chronic obstructive pulmonary disease, unspecified: Secondary | ICD-10-CM | POA: Insufficient documentation

## 2023-10-30 DIAGNOSIS — E860 Dehydration: Secondary | ICD-10-CM | POA: Insufficient documentation

## 2023-10-30 DIAGNOSIS — E1165 Type 2 diabetes mellitus with hyperglycemia: Secondary | ICD-10-CM | POA: Insufficient documentation

## 2023-10-30 DIAGNOSIS — I2510 Atherosclerotic heart disease of native coronary artery without angina pectoris: Secondary | ICD-10-CM | POA: Insufficient documentation

## 2023-10-30 DIAGNOSIS — Z20822 Contact with and (suspected) exposure to covid-19: Secondary | ICD-10-CM | POA: Insufficient documentation

## 2023-10-30 LAB — COMPREHENSIVE METABOLIC PANEL
ALT: 12 U/L (ref 0–44)
AST: 15 U/L (ref 15–41)
Albumin: 3.6 g/dL (ref 3.5–5.0)
Alkaline Phosphatase: 64 U/L (ref 38–126)
Anion gap: 11 (ref 5–15)
BUN: 20 mg/dL (ref 8–23)
CO2: 25 mmol/L (ref 22–32)
Calcium: 8.3 mg/dL — ABNORMAL LOW (ref 8.9–10.3)
Chloride: 99 mmol/L (ref 98–111)
Creatinine, Ser: 1.38 mg/dL — ABNORMAL HIGH (ref 0.61–1.24)
GFR, Estimated: 51 mL/min — ABNORMAL LOW (ref >60)
Glucose, Bld: 221 mg/dL — ABNORMAL HIGH (ref 70–99)
Potassium: 3.4 mmol/L — ABNORMAL LOW (ref 3.5–5.1)
Sodium: 135 mmol/L (ref 135–145)
Total Bilirubin: 0.6 mg/dL (ref 0.0–1.2)
Total Protein: 6.3 g/dL — ABNORMAL LOW (ref 6.5–8.1)

## 2023-10-30 LAB — URINALYSIS, COMPLETE (UACMP) WITH MICROSCOPIC
Bacteria, UA: NONE SEEN
Bilirubin Urine: NEGATIVE
Glucose, UA: >=500 mg/dL — AB
Hgb urine dipstick: NEGATIVE
Ketones, ur: NEGATIVE mg/dL
Leukocytes,Ua: NEGATIVE
Nitrite: NEGATIVE
Protein, ur: NEGATIVE mg/dL
Specific Gravity, Urine: 1.018 (ref 1.005–1.030)
Squamous Epithelial / HPF: 0 /[HPF] (ref 0–5)
pH: 7 (ref 5.0–8.0)

## 2023-10-30 LAB — CBC
HCT: 37.4 % — ABNORMAL LOW (ref 39.0–52.0)
Hemoglobin: 11.7 g/dL — ABNORMAL LOW (ref 13.0–17.0)
MCH: 27.9 pg (ref 26.0–34.0)
MCHC: 31.3 g/dL (ref 30.0–36.0)
MCV: 89.3 fL (ref 80.0–100.0)
Platelets: 177 10*3/uL (ref 150–400)
RBC: 4.19 MIL/uL — ABNORMAL LOW (ref 4.22–5.81)
RDW: 14.3 % (ref 11.5–15.5)
WBC: 12.6 10*3/uL — ABNORMAL HIGH (ref 4.0–10.5)
nRBC: 0 % (ref 0.0–0.2)

## 2023-10-30 LAB — RESP PANEL BY RT-PCR (RSV, FLU A&B, COVID)  RVPGX2
Influenza A by PCR: NEGATIVE
Influenza B by PCR: NEGATIVE
Resp Syncytial Virus by PCR: NEGATIVE
SARS Coronavirus 2 by RT PCR: NEGATIVE

## 2023-10-30 LAB — TROPONIN I (HIGH SENSITIVITY): Troponin I (High Sensitivity): 32 ng/L — ABNORMAL HIGH (ref <18)

## 2023-10-30 MED ORDER — SODIUM CHLORIDE 0.9 % IV BOLUS
1000.0000 mL | Freq: Once | INTRAVENOUS | Status: AC
  Administered 2023-10-30: 1000 mL via INTRAVENOUS

## 2023-10-30 NOTE — ED Triage Notes (Signed)
 Pt arrives from home via EMS for a near syncope episode after dinner. Pt says he was alert but his vision went black. EMS found pt cold and clammy. Hx of diabetes and heart disease.  EMS vitals: Sysytolic 98 then 027/25 after 3664 mL of NS 95% SPO2 on RA 64 HR 312 CBG

## 2023-10-30 NOTE — ED Provider Notes (Signed)
 [facility] Provider Note    Event Date/Time   First MD Initiated Contact with Patient 10/30/23 1934     (approximate)  History   Chief Complaint: Near Syncope  HPI  [name] is a 83 y.o. male with a past medical history of COPD, diabetes, CAD, hypertension, presents to the emergency department after an episode of near syncope.  According to report patient is coming from home with an episode of near syncope.  He states after dinner he was walking when he became weak and lightheaded, states his vision started to fade but then came back.  When EMS arrived they noted the patient to be somewhat clammy in appearance.  CBG per EMS of 312, otherwise reassuring vital signs.  Did receive IV fluids by EMS.  Patient denies any fever but does state cough and congestion over the past 1 to 2 weeks.  Denies any nausea vomiting or diarrhea.  States some mild chest tightness earlier today denies any abdominal pain.  Physical Exam   Triage Vital Signs: ED Triage Vitals  Encounter Vitals Group     BP 10/30/23 1902 (!) 171/82     Systolic BP Percentile --      Diastolic BP Percentile --      Pulse Rate 10/30/23 1902 73     Resp 10/30/23 1902 18     Temp --      Temp src --      SpO2 10/30/23 1902 98 %     Weight --      Height --      Head Circumference --      Peak Flow --      Pain Score 10/30/23 1858 0     Pain Loc --      Pain Education --      Exclude from Growth Chart --     Most recent vital signs: Vitals:   10/30/23 1902  BP: (!) 171/82  Pulse: 73  Resp: 18  SpO2: 98%    General: Awake, no distress.  CV:  Good peripheral perfusion.  Regular rate and rhythm  Resp:  Normal effort.  Equal breath sounds bilaterally.  Abd:  No distention.  Soft, nontender.  No rebound or guarding.  ED Results / Procedures / Treatments   EKG  EKG viewed and interpreted by myself shows a normal sinus rhythm at 76 bpm with a narrow QRS, normal axis, normal intervals,  no concerning ST changes.  MEDICATIONS ORDERED IN ED: Medications  sodium chloride  0.9 % bolus 1,000 mL (has no administration in time range)     IMPRESSION / MDM / ASSESSMENT AND PLAN / ED COURSE  I reviewed the triage vital signs and the nursing notes.  Patient's presentation is most consistent with acute presentation with potential threat to life or bodily function.  Patient presents to the emergency department for episode of lightheadedness/near syncope.  Overall patient appears well, is hypertensive in the emergency department 170/80.  Otherwise reassuring vitals.  States mild chest tightness earlier today we will check labs including cardiac enzymes and EKG.  Given the patient's generalized fatigue/weakness today we will also check a COVID/flu swab as a precaution.  We will IV hydrate and continue to closely monitor.  I would like to obtain a urine sample to rule out urinary tract infection as well though the patient denies any urinary symptoms.  Fingerstick blood glucose elevated greater than 300 per EMS, patient states his metformin was supposed to be  refilled yesterday but it was not and he ran out yesterday morning.  Patient is feeling much better after IV fluids.  Patient's workup is reassuring with a reassuring CBC, reassuring chemistry, troponin of 32 which is unchanged from historical values.  Urinalysis shows no sign of infection.  Respiratory panel is negative.  Patient states he feels better and is ready to go home.  We will discharge have the patient follow-up with his doctor.  FINAL CLINICAL IMPRESSION(S) / ED DIAGNOSES   Hyperglycemia Near syncope Dehydration   Note:  This document was prepared using Dragon voice recognition software and may include unintentional dictation errors.   Ruth Cove, MD 10/30/23 2232

## 2023-12-20 ENCOUNTER — Encounter: Payer: Self-pay | Admitting: Ophthalmology

## 2023-12-27 NOTE — Discharge Instructions (Signed)

## 2023-12-28 ENCOUNTER — Ambulatory Visit: Payer: Self-pay | Admitting: Anesthesiology

## 2023-12-28 ENCOUNTER — Ambulatory Visit
Admission: RE | Admit: 2023-12-28 | Discharge: 2023-12-28 | Disposition: A | Discharge status: Home / Self Care | Attending: Ophthalmology | Admitting: Ophthalmology

## 2023-12-28 ENCOUNTER — Encounter: Payer: Self-pay | Admitting: Ophthalmology

## 2023-12-28 ENCOUNTER — Other Ambulatory Visit: Payer: Self-pay

## 2023-12-28 ENCOUNTER — Encounter
Admission: RE | Disposition: A | Discharge status: Home / Self Care | Payer: Self-pay | Source: Home / Self Care | Attending: Ophthalmology

## 2023-12-28 DIAGNOSIS — I252 Old myocardial infarction: Secondary | ICD-10-CM | POA: Diagnosis not present

## 2023-12-28 DIAGNOSIS — N289 Disorder of kidney and ureter, unspecified: Secondary | ICD-10-CM | POA: Diagnosis not present

## 2023-12-28 DIAGNOSIS — I2510 Atherosclerotic heart disease of native coronary artery without angina pectoris: Secondary | ICD-10-CM | POA: Diagnosis not present

## 2023-12-28 DIAGNOSIS — H2511 Age-related nuclear cataract, right eye: Secondary | ICD-10-CM | POA: Insufficient documentation

## 2023-12-28 DIAGNOSIS — E0590 Thyrotoxicosis, unspecified without thyrotoxic crisis or storm: Secondary | ICD-10-CM | POA: Diagnosis not present

## 2023-12-28 DIAGNOSIS — Z833 Family history of diabetes mellitus: Secondary | ICD-10-CM | POA: Insufficient documentation

## 2023-12-28 DIAGNOSIS — K219 Gastro-esophageal reflux disease without esophagitis: Secondary | ICD-10-CM | POA: Insufficient documentation

## 2023-12-28 DIAGNOSIS — Z8673 Personal history of transient ischemic attack (TIA), and cerebral infarction without residual deficits: Secondary | ICD-10-CM | POA: Diagnosis not present

## 2023-12-28 DIAGNOSIS — E1136 Type 2 diabetes mellitus with diabetic cataract: Secondary | ICD-10-CM | POA: Diagnosis not present

## 2023-12-28 DIAGNOSIS — F17210 Nicotine dependence, cigarettes, uncomplicated: Secondary | ICD-10-CM | POA: Insufficient documentation

## 2023-12-28 DIAGNOSIS — Z7984 Long term (current) use of oral hypoglycemic drugs: Secondary | ICD-10-CM | POA: Insufficient documentation

## 2023-12-28 DIAGNOSIS — J449 Chronic obstructive pulmonary disease, unspecified: Secondary | ICD-10-CM | POA: Diagnosis not present

## 2023-12-28 DIAGNOSIS — I10 Essential (primary) hypertension: Secondary | ICD-10-CM | POA: Insufficient documentation

## 2023-12-28 HISTORY — DX: Presence of dental prosthetic device (complete) (partial): Z97.2

## 2023-12-28 HISTORY — DX: Dizziness and giddiness: R42

## 2023-12-28 HISTORY — PX: CATARACT EXTRACTION W/PHACO: SHX586

## 2023-12-28 SURGERY — PHACOEMULSIFICATION, CATARACT, WITH IOL INSERTION
Anesthesia: Monitor Anesthesia Care | Site: Eye | Laterality: Right

## 2023-12-28 MED ORDER — TETRACAINE HCL 0.5 % OP SOLN
OPHTHALMIC | Status: AC
  Filled 2023-12-28: qty 4

## 2023-12-28 MED ORDER — BRIMONIDINE TARTRATE-TIMOLOL 0.2-0.5 % OP SOLN
OPHTHALMIC | Status: DC | PRN
  Administered 2023-12-28: 1 [drp] via OPHTHALMIC

## 2023-12-28 MED ORDER — ARMC OPHTHALMIC DILATING DROPS
1.0000 | OPHTHALMIC | Status: DC | PRN
  Administered 2023-12-28 (×3): 1 via OPHTHALMIC

## 2023-12-28 MED ORDER — SIGHTPATH DOSE#1 BSS IO SOLN
INTRAOCULAR | Status: DC | PRN
  Administered 2023-12-28: 15 mL via INTRAOCULAR

## 2023-12-28 MED ORDER — MIDAZOLAM HCL 5 MG/5ML IJ SOLN
INTRAMUSCULAR | Status: DC | PRN
  Administered 2023-12-28: 1 mg via INTRAVENOUS

## 2023-12-28 MED ORDER — SIGHTPATH DOSE#1 SODIUM HYALURONATE 10 MG/ML IO SOLUTION
PREFILLED_SYRINGE | INTRAOCULAR | Status: DC | PRN
  Administered 2023-12-28: .4 mL via INTRAOCULAR

## 2023-12-28 MED ORDER — MIDAZOLAM HCL 2 MG/2ML IJ SOLN
INTRAMUSCULAR | Status: AC
  Filled 2023-12-28: qty 2

## 2023-12-28 MED ORDER — TETRACAINE HCL 0.5 % OP SOLN
1.0000 [drp] | OPHTHALMIC | Status: DC | PRN
  Administered 2023-12-28 (×3): 1 [drp] via OPHTHALMIC

## 2023-12-28 MED ORDER — MOXIFLOXACIN HCL 0.5 % OP SOLN
OPHTHALMIC | Status: DC | PRN
  Administered 2023-12-28: .2 mL via OPHTHALMIC

## 2023-12-28 MED ORDER — PHENYLEPHRINE-KETOROLAC 1-0.3 % IO SOLN
INTRAOCULAR | Status: DC | PRN
  Administered 2023-12-28: 82 mL via OPHTHALMIC

## 2023-12-28 MED ORDER — ARMC OPHTHALMIC DILATING DROPS
OPHTHALMIC | Status: AC
  Filled 2023-12-28: qty 0.5

## 2023-12-28 MED ORDER — SIGHTPATH DOSE#1 NA HYALUR & NA CHOND-NA HYALUR IO KIT
PACK | INTRAOCULAR | Status: DC | PRN
  Administered 2023-12-28: 1 via OPHTHALMIC

## 2023-12-28 MED ORDER — LIDOCAINE HCL (PF) 2 % IJ SOLN
INTRAMUSCULAR | Status: DC | PRN
  Administered 2023-12-28: 4 mL via INTRAOCULAR

## 2023-12-28 SURGICAL SUPPLY — 17 items
CAPSULAR TENSION RING (Miscellaneous) ×1 IMPLANT
CATARACT SUITE SIGHTPATH (MISCELLANEOUS) ×1 IMPLANT
DISSECTOR HYDRO NUCLEUS 50X22 (MISCELLANEOUS) ×1 IMPLANT
DRSG TEGADERM 2-3/8X2-3/4 SM (GAUZE/BANDAGES/DRESSINGS) ×1 IMPLANT
FEE CATARACT SUITE SIGHTPATH (MISCELLANEOUS) ×1 IMPLANT
GLOVE BIOGEL PI IND STRL 8 (GLOVE) ×1 IMPLANT
GLOVE SURG LX STRL 7.5 STRW (GLOVE) ×1 IMPLANT
GLOVE SURG PROTEXIS BL SZ6.5 (GLOVE) ×1 IMPLANT
GLOVE SURG SYN 6.5 PF PI BL (GLOVE) ×1 IMPLANT
LENS IOL ACRSF MP 21.5 (Intraocular Lens) IMPLANT
LENS IOL ACRYSOF POST 21.5 (Intraocular Lens) ×1 IMPLANT
LENS IOL DIOP 22.5 (Intraocular Lens) IMPLANT
LENS IOL TECNIS MONO 22.5 (Intraocular Lens) IMPLANT
NDL FILTER BLUNT 18X1 1/2 (NEEDLE) ×1 IMPLANT
NEEDLE FILTER BLUNT 18X1 1/2 (NEEDLE) ×1 IMPLANT
RING TENSION CAPSULAR (Miscellaneous) IMPLANT
SYR 3ML LL SCALE MARK (SYRINGE) ×1 IMPLANT

## 2023-12-28 NOTE — Op Note (Signed)
 OPERATIVE NOTE  [name] [id number] 12/28/2023   PREOPERATIVE DIAGNOSIS: Nuclear sclerotic cataract right eye. H25.11   POSTOPERATIVE DIAGNOSIS: Nuclear sclerotic cataract right eye. H25.11   PROCEDURE:  Phacoemusification with posterior chamber intraocular lens placement of the right eye  Ultrasound time: Procedure(s): PHACOEMULSIFICATION, CATARACT, WITH IOL INSERTION 12.39 01:23.4 (Right)  LENS:   Implant Name Type Inv. Item Serial No. Manufacturer Lot No. LRB No. Used Action  LENS IOL DIOP 22.5 - [id number] Intraocular Lens LENS IOL DIOP 22.5 [id number] [employer]  Right 1 Wasted  CAPSULAR TENSION RING - [id number] Miscellaneous CAPSULAR TENSION RING [id number] [facility] [id number] Right 1 Implanted  LENS IOL ACRYSOF POST 21.5 - [id number] Intraocular Lens LENS IOL ACRYSOF POST 21.5 [id number] [employer]  Right 1 Implanted      SURGEON:  Julious Payer. Rolley Sims, MD   ANESTHESIA:  Topical with tetracaine drops, augmented with 1% preservative-free intracameral lidocaine.   COMPLICATIONS:  None.   DESCRIPTION OF PROCEDURE:  The patient was identified in the holding room and transported to the operating room and placed in the supine position under the operating microscope.  The right eye was identified as the operative eye, which was prepped and draped in the usual sterile ophthalmic fashion.   A 1 millimeter clear-corneal paracentesis was made superotemporally. Preservative-free 1% lidocaine mixed with 1:1,000 bisulfite-free aqueous solution of epinephrine was injected into the anterior chamber. The anterior chamber was then filled with Viscoat viscoelastic. A 2.4 millimeter keratome was used to make a clear-corneal incision inferotemporally. A curvilinear capsulorrhexis was made with a cystotome and capsulorrhexis forceps. Capsular folds were noted during initiation of the capsuolorrhexis, indicative of zonulopathy. Balanced salt solution was used to hydrodissect and hydrodelineate the nucleus.  Phacoemulsification was then used to remove the lens nucleus and epinucleus. The remaining cortex was then removed using the irrigation and aspiration handpiece. The capsulorrhexis was shifted inferiorly, suggestive of zonular weakness likely superiorly.  Provisc was then placed into the capsular bag to distend it for placement of a 12 mm capsular tension ring to stabilize the capsular bag. The main incision was enlarged with the keratome and a +21.50 D [id number] intraocular lens was then injected into the sulcus. Optic capture was performed. The remaining viscoelastic was aspirated.   Wounds were hydrated with balanced salt solution.  The anterior chamber was inflated to a physiologic pressure with balanced salt solution.  No wound leaks were noted. Moxifloxacin was injected intracamerally.  Timolol and Brimonidine drops were applied to the eye.  The patient was taken to the recovery room in stable condition without complications of anesthesia or surgery.  Rolly Pancake [place] 12/28/2023, 11:46 AM

## 2023-12-28 NOTE — Transfer of Care (Signed)
 Immediate Anesthesia Transfer of Care Note  Patient: [name]  Procedure(s) Performed: PHACOEMULSIFICATION, CATARACT, WITH IOL INSERTION 12.39 01:23.4 (Right: Eye)  Patient Location: PACU  Anesthesia Type: MAC  Level of Consciousness: awake, alert  and patient cooperative  Airway and Oxygen Therapy: Patient Spontanous Breathing and Patient connected to supplemental oxygen  Post-op Assessment: Post-op Vital signs reviewed, Patient's Cardiovascular Status Stable, Respiratory Function Stable, Patent Airway and No signs of Nausea or vomiting  Post-op Vital Signs: Reviewed and stable  Complications: No notable events documented.

## 2023-12-28 NOTE — Anesthesia Postprocedure Evaluation (Signed)
 Anesthesia Post Note  Patient: [name]  Procedure(s) Performed: PHACOEMULSIFICATION, CATARACT, WITH IOL INSERTION 12.39 01:23.4 (Right: Eye)  Patient location during evaluation: PACU Anesthesia Type: MAC Level of consciousness: awake and alert Pain management: pain level controlled Vital Signs Assessment: post-procedure vital signs reviewed and stable Respiratory status: spontaneous breathing, nonlabored ventilation, respiratory function stable and patient connected to nasal cannula oxygen Cardiovascular status: stable and blood pressure returned to baseline Postop Assessment: no apparent nausea or vomiting Anesthetic complications: no   No notable events documented.   Last Vitals:  Vitals:   12/28/23 1147 12/28/23 1156  BP: (!) 150/87 (!) 151/85  Pulse: 78 76  Resp: 14 15  Temp: 36.7 C 36.7 C  SpO2: 96% 96%    Last Pain:  Vitals:   12/28/23 1156  TempSrc:   PainSc: 0-No pain                 Shalva Rozycki C Maloree Uplinger

## 2023-12-28 NOTE — Anesthesia Preprocedure Evaluation (Addendum)
 Anesthesia Evaluation  Patient identified by MRN, date of birth, ID band Patient awake    Reviewed: Allergy & Precautions, H&P , NPO status , Patient's Chart, lab work & pertinent test results  Airway Mallampati: III  TM Distance: <3 FB Neck ROM: Full    Dental no notable dental hx. (+) Upper Dentures, Lower Dentures   Pulmonary pneumonia, COPD, Current Smoker   Pulmonary exam normal breath sounds clear to auscultation       Cardiovascular hypertension, + CAD, + Past MI and + Peripheral Vascular Disease  Normal cardiovascular exam Rhythm:Regular Rate:Normal     Neuro/Psych  PSYCHIATRIC DISORDERS Anxiety     TIA Neuromuscular disease CVA negative neurological ROS  negative psych ROS   GI/Hepatic negative GI ROS, Neg liver ROS,GERD  ,,  Endo/Other  diabetes Hyperthyroidism   Renal/GU Renal diseasenegative Renal ROS  negative genitourinary   Musculoskeletal negative musculoskeletal ROS (+)    Abdominal   Peds negative pediatric ROS (+)  Hematology negative hematology ROS (+) Blood dyscrasia, anemia   Anesthesia Other Findings Hypertension Diabetes mellitus without complication  TIA (transient ischemic attack) COPD (chronic obstructive pulmonary disease)  Atypical chest pain Coronary artery calcification on CT scan Erosive gastropathy GERD (gastroesophageal reflux disease) Pulmonary nodules Vertigo Wears dentures   Reproductive/Obstetrics negative OB ROS                             Anesthesia Physical Anesthesia Plan  ASA: 3  Anesthesia Plan: MAC   Post-op Pain Management:    Induction: Intravenous  PONV Risk Score and Plan:   Airway Management Planned: Natural Airway and Nasal Cannula  Additional Equipment:   Intra-op Plan:   Post-operative Plan:   Informed Consent: I have reviewed the patients History and Physical, chart, labs and discussed the procedure including the  risks, benefits and alternatives for the proposed anesthesia with the patient or authorized representative who has indicated his/her understanding and acceptance.     Dental Advisory Given  Plan Discussed with: Anesthesiologist, CRNA and Surgeon  Anesthesia Plan Comments: (Patient consented for risks of anesthesia including but not limited to:  - adverse reactions to medications - damage to eyes, teeth, lips or other oral mucosa - nerve damage due to positioning  - sore throat or hoarseness - Damage to heart, brain, nerves, lungs, other parts of body or loss of life  Patient voiced understanding and assent.)       Anesthesia Quick Evaluation

## 2023-12-28 NOTE — H&P (Signed)
 [facility]   Primary Care Physician:  Leanord Asal, Nelva Bush, MD Ophthalmologist: Dr. Deberah Pelton  Pre-Procedure History & Physical: HPI:  [name] is a 83 y.o. male here for cataract surgery.   Past Medical History:  Diagnosis Date   Atypical chest pain    a. 05/2011 Neg MV; b. 04/2013 Neg MV; c. 04/2019 Echo: EF 55-60%, mild BAE. Degen MV dzs. Nl RV fxn; d. 06/2019 MV: EF 55%, small, mild, fixed apical and apical ant defect - probable artifact. No ischemia.   COPD (chronic obstructive pulmonary disease) (HCC)    Coronary artery calcification seen on CT scan    a. 07/2018 CT Chest - cor Ca2+.   Diabetes mellitus without complication (HCC)    Erosive gastropathy    a. 06/2019 EGD   GERD (gastroesophageal reflux disease)    Hypertension    Pulmonary nodules    a. 07/2018 Chest CT: RML and RLL nodules.   TIA (transient ischemic attack)    Vertigo    no episodes for several years   Wears dentures    full upper and lower    Past Surgical History:  Procedure Laterality Date   CARDIAC CATHETERIZATION  2024   [facility]   EYE SURGERY     LOWER EXTREMITY ANGIOGRAPHY Left 09/29/2020   Procedure: LOWER EXTREMITY ANGIOGRAPHY;  Surgeon: Renford Dills, MD;  Location: [facility] INVASIVE CV LAB;  Service: Cardiovascular;  Laterality: Left;   LOWER EXTREMITY ANGIOGRAPHY Left 11/10/2020   Procedure: LOWER EXTREMITY ANGIOGRAPHY;  Surgeon: Renford Dills, MD;  Location: [facility] INVASIVE CV LAB;  Service: Cardiovascular;  Laterality: Left;    Prior to Admission medications   Medication Sig Start Date End Date Taking? Authorizing Provider  albuterol (PROVENTIL) (2.5 MG/3ML) 0.083% nebulizer solution Take 2.5 mg by nebulization every 4 (four) hours as needed. 1 Vial(s) Via Nebulizer Every 4-6 Hours PRN 01/04/23  Yes [provider]  albuterol (VENTOLIN HFA) 108 (90 Base) MCG/ACT inhaler Inhale 2 puffs into the lungs every 6 (six) hours as needed for shortness of breath or  wheezing. 12/07/15  Yes [provider]  amLODipine (NORVASC) 2.5 MG tablet Take 2.5 mg by mouth. 12/28/22  Yes [provider]  ascorbic acid (VITAMIN C) 500 MG tablet Take 500 mg by mouth every other day.   Yes [provider]  atorvastatin (LIPITOR) 40 MG tablet Take 1 tablet (40 mg total) by mouth daily. 08/14/22 12/20/23 Yes Gillis Santa, MD  budeson-glycopyrrolate-formoterol (BREZTRI AEROSPHERE) 160-9-4.8 MCG/ACT AERO Inhale into the lungs in the morning and at bedtime.   Yes [provider]  clopidogrel (PLAVIX) 75 MG tablet TAKE 1 TABLET BY MOUTH EVERY DAY 02/26/21  Yes Schnier, Latina Craver, MD  DULoxetine (CYMBALTA) 30 MG capsule Take 1 capsule by mouth daily. 12/26/22  Yes [provider]  ferrous sulfate 325 (65 FE) MG tablet Take 325 mg by mouth daily with breakfast. 02/11/20  Yes [provider]  folic acid (FOLVITE) 1 MG tablet Take 1 tablet by mouth daily.   Yes [provider]  hydrOXYzine (ATARAX) 10 MG tablet Take 25 mg by mouth every 8 (eight) hours as needed. Take one tablet once daily if needed for anxiety. May take one additional tablet in 8 hrs if needed. Max of 3 tablets daily. 01/13/23  Yes [provider]  lidocaine (LIDODERM) 5 % Place 1 patch onto the skin daily as needed (Pain). 06/30/20  Yes [provider]  magnesium oxide (MAG-OX) 400 MG  tablet Take 400 mg by mouth 2 (two) times daily. 09/01/20  Yes [provider]  metFORMIN (GLUCOPHAGE) 850 MG tablet Take 850 mg by mouth daily with breakfast.    Yes [provider]  metoprolol succinate (TOPROL-XL) 25 MG 24 hr tablet Take 25 mg by mouth daily. 08/02/19  Yes [provider]  mirtazapine (REMERON) 15 MG tablet Take 30 mg by mouth at bedtime.   Yes [provider]  ondansetron (ZOFRAN ODT) 4 MG disintegrating tablet Take 1 tablet (4 mg total) by mouth every 8 (eight) hours as needed for nausea or vomiting. 01/07/20   Yes Sharman Cheek, MD  pantoprazole (PROTONIX) 40 MG tablet Take 40 mg by mouth daily. 01/15/20  Yes [provider]  polyethylene glycol powder (GLYCOLAX/MIRALAX) 17 GM/SCOOP powder Take 1 Container by mouth daily as needed for mild constipation. 12/25/22  Yes [provider]  tamsulosin (FLOMAX) 0.4 MG CAPS capsule Take 0.4 mg by mouth daily. 08/03/22  Yes [provider]  aspirin 81 MG chewable tablet Chew 81 mg by mouth daily. Patient not taking: Reported on 12/20/2023 06/03/20   [provider]  JARDIANCE 10 MG TABS tablet Take 10 mg by mouth as needed. Patient not taking: Reported on 12/20/2023    [provider]  lidocaine (XYLOCAINE) 5 % ointment APPLY 1 APPLICATION TOPICALLY AS NEEDED. Patient not taking: Reported on 08/13/2022 11/23/20   Georgiana Spinner, NP  nitroGLYCERIN (NITROSTAT) 0.4 MG SL tablet Place 0.4 mg under the tongue as needed. Patient not taking: Reported on 12/20/2023 04/07/23   [provider]  OneTouch Delica Lancets 33G MISC Apply topically. 05/26/20   [provider]    Allergies as of 12/15/2023 - Review Complete 10/30/2023  Allergen Reaction Noted   Lisinopril Swelling 04/12/2018   Aspirin  03/17/2020   Codeine Other (See Comments) 05/03/2016   Ivp dye [iodinated contrast media] Hives and Other (See Comments) 02/23/2015   Percocet [oxycodone-acetaminophen] Hives and Nausea Only 02/23/2015    Family History  Problem Relation Age of Onset   Diabetes Mellitus II Mother    Diabetes Mellitus II Father    Diabetes Mellitus II Sister    Diabetes Mellitus II Brother     Social History   Socioeconomic History   Marital status: Widowed    Spouse name: Not on file   Number of children: Not on file   Years of education: Not on file   Highest education level: Not on file  Occupational History   Not on file  Tobacco Use   Smoking status: Every Day    Current packs/day: 0.10    Average packs/day: 0.5  packs/day for 67.3 years (33.3 ttl pk-yrs)    Types: Cigarettes    Start date: 1958   Smokeless tobacco: Never  Vaping Use   Vaping status: Never Used  Substance and Sexual Activity   Alcohol use: Not Currently   Drug use: No   Sexual activity: Not on file  Other Topics Concern   Not on file  Social History Narrative   Lives locally by himself.  Children live locally but work and he says he is mostly alone.  Cont to smoke up to 1/2 ppd.  Does not routinely exercise.   Social Drivers of Corporate investment banker Strain: Low Risk  (12/25/2022)   Received from [facility], [facility]   Overall Financial Resource Strain (CARDIA)    Difficulty of Paying Living Expenses: Not very hard  Food Insecurity: No Food Insecurity (12/25/2022)   Received from [facility], [facility]   Hunger Vital Sign    Worried About Running Out of Food in the Last Year: Never true    Ran Out of Food in the Last Year: Never true  Transportation Needs: No Transportation Needs (12/25/2022)   Received from [facility], [facility]   [facility] - Transportation    Lack of Transportation (Medical): No    Lack of Transportation (Non-Medical): No  Physical Activity: Sufficiently Active (05/19/2020)   Received from [facility], [facility]   Exercise Vital Sign    Days of Exercise per Week: 7 days    Minutes of Exercise per Session: 120 min  Stress: No Stress Concern Present (05/19/2020)   Received from [facility], [facility] - Occupational Stress Questionnaire    Feeling of Stress : Not at all  Social Connections: Not on file  Intimate Partner Violence: Not At Risk (11/02/2022)   Received from [facility], [facility]   Humiliation, Afraid, Rape, and Kick questionnaire    Fear of Current or Ex-Partner: No    Emotionally Abused: No    Physically Abused: No    Sexually Abused: No    Review of Systems: See HPI, otherwise  negative ROS  Physical Exam: Ht 5\' 7"  (1.702 m)   Wt 75.3 kg   BMI 26.00 kg/m  General:   Alert, cooperative in NAD Head:  Normocephalic and atraumatic. Respiratory:  Normal work of breathing. Cardiovascular:  RRR  Impression/Plan: [name] is here for cataract surgery.  Risks, benefits, limitations, and alternatives regarding cataract surgery have been reviewed with the patient.  Questions have been answered.  All parties agreeable.   Estanislado Pandy, MD  12/28/2023, 7:12 AM

## 2024-03-14 ENCOUNTER — Emergency Department
Admission: EM | Admit: 2024-03-14 | Discharge: 2024-03-15 | Disposition: A | Discharge status: Home / Self Care | Attending: Emergency Medicine | Admitting: Emergency Medicine

## 2024-03-14 ENCOUNTER — Emergency Department

## 2024-03-14 ENCOUNTER — Other Ambulatory Visit: Payer: Self-pay

## 2024-03-14 DIAGNOSIS — R0789 Other chest pain: Secondary | ICD-10-CM | POA: Insufficient documentation

## 2024-03-14 DIAGNOSIS — R7989 Other specified abnormal findings of blood chemistry: Secondary | ICD-10-CM | POA: Diagnosis not present

## 2024-03-14 DIAGNOSIS — R600 Localized edema: Secondary | ICD-10-CM | POA: Insufficient documentation

## 2024-03-14 DIAGNOSIS — I7121 Aneurysm of the ascending aorta, without rupture: Secondary | ICD-10-CM | POA: Diagnosis not present

## 2024-03-14 DIAGNOSIS — J439 Emphysema, unspecified: Secondary | ICD-10-CM | POA: Insufficient documentation

## 2024-03-14 DIAGNOSIS — I2510 Atherosclerotic heart disease of native coronary artery without angina pectoris: Secondary | ICD-10-CM | POA: Diagnosis not present

## 2024-03-14 DIAGNOSIS — R791 Abnormal coagulation profile: Secondary | ICD-10-CM | POA: Insufficient documentation

## 2024-03-14 DIAGNOSIS — M7989 Other specified soft tissue disorders: Secondary | ICD-10-CM | POA: Insufficient documentation

## 2024-03-14 DIAGNOSIS — R079 Chest pain, unspecified: Secondary | ICD-10-CM

## 2024-03-14 DIAGNOSIS — J189 Pneumonia, unspecified organism: Secondary | ICD-10-CM

## 2024-03-14 DIAGNOSIS — I712 Thoracic aortic aneurysm, without rupture, unspecified: Secondary | ICD-10-CM

## 2024-03-14 LAB — CBC
HCT: 39.9 % (ref 39.0–52.0)
Hemoglobin: 12.4 g/dL — ABNORMAL LOW (ref 13.0–17.0)
MCH: 26.2 pg (ref 26.0–34.0)
MCHC: 31.1 g/dL (ref 30.0–36.0)
MCV: 84.4 fL (ref 80.0–100.0)
Platelets: 147 10*3/uL — ABNORMAL LOW (ref 150–400)
RBC: 4.73 MIL/uL (ref 4.22–5.81)
RDW: 14.8 % (ref 11.5–15.5)
WBC: 9.7 10*3/uL (ref 4.0–10.5)
nRBC: 0 % (ref 0.0–0.2)

## 2024-03-14 LAB — BASIC METABOLIC PANEL WITH GFR
Anion gap: 6 (ref 5–15)
BUN: 18 mg/dL (ref 8–23)
CO2: 30 mmol/L (ref 22–32)
Calcium: 8.2 mg/dL — ABNORMAL LOW (ref 8.9–10.3)
Chloride: 100 mmol/L (ref 98–111)
Creatinine, Ser: 1.17 mg/dL (ref 0.61–1.24)
GFR, Estimated: >60 mL/min (ref >60)
Glucose, Bld: 173 mg/dL — ABNORMAL HIGH (ref 70–99)
Potassium: 3.5 mmol/L (ref 3.5–5.1)
Sodium: 136 mmol/L (ref 135–145)

## 2024-03-14 LAB — TROPONIN I (HIGH SENSITIVITY)
Troponin I (High Sensitivity): 46 ng/L — ABNORMAL HIGH (ref <18)
Troponin I (High Sensitivity): 44 ng/L — ABNORMAL HIGH (ref <18)

## 2024-03-14 LAB — D-DIMER, QUANTITATIVE: D-Dimer, Quant: 0.74 ug{FEU}/mL — ABNORMAL HIGH (ref 0.00–0.50)

## 2024-03-14 LAB — BRAIN NATRIURETIC PEPTIDE: B Natriuretic Peptide: 91.9 pg/mL (ref 0.0–100.0)

## 2024-03-14 MED ORDER — DIPHENHYDRAMINE HCL 50 MG/ML IJ SOLN: 50.0000 mg | Freq: Once | INTRAMUSCULAR | Status: AC

## 2024-03-14 MED ORDER — DIPHENHYDRAMINE HCL 25 MG PO CAPS
50.0000 mg | ORAL_CAPSULE | Freq: Once | ORAL | Status: AC
  Administered 2024-03-15: 50 mg via ORAL
  Filled 2024-03-14: qty 2

## 2024-03-14 MED ORDER — METHYLPREDNISOLONE SODIUM SUCC 40 MG IJ SOLR
40.0000 mg | Freq: Once | INTRAMUSCULAR | Status: AC
  Administered 2024-03-14: 40 mg via INTRAVENOUS
  Filled 2024-03-14: qty 1

## 2024-03-14 NOTE — ED Triage Notes (Signed)
 Pt here via [facility] with cp. Pt had sudden central chest pressure, 4/10/. Pt also endorses SOB. Pt took 1 nitroglycerin  tab. 1 nitroglycerin  spray and 324 mg of aspirin  given by ems. Hx of htn and DM. Pt also has fluid retention in his legs.   160/90---144/80 18G RAC 90% RA---96% on 4L

## 2024-03-14 NOTE — ED Provider Notes (Signed)
 [facility] Provider Note    Event Date/Time   First MD Initiated Contact with Patient 03/14/24 1756     (approximate)   History   Chest Pain   HPI  [name] is a 83 y.o. male who presents to the emergency department today because of concerns for chest tightness.  The patient's symptoms started while the patient was on his phone talking to his primary care doctor's office.  He had called his doctors because concerns for bilateral leg swelling.  This has been getting worse for the past week.  He denies any pain in his legs.  Denies any recent travel.  The patient states that the chest tightness was initially located in the left side of his chest before moving more medially.  Denies any radiation to his jaw or arm.     Physical Exam   Triage Vital Signs: ED Triage Vitals  Encounter Vitals Group     BP 03/14/24 1458 (!) 160/89     Girls Systolic BP Percentile --      Girls Diastolic BP Percentile --      Boys Systolic BP Percentile --      Boys Diastolic BP Percentile --      Pulse Rate 03/14/24 1458 90     Resp 03/14/24 1458 17     Temp 03/14/24 1458 (!) 97.5 F (36.4 C)     Temp Source 03/14/24 1458 Oral     SpO2 03/14/24 1458 95 %     Weight 03/14/24 1501 166 lb 0.1 oz (75.3 kg)     Height 03/14/24 1501 5' 7 (1.702 m)     Head Circumference --      Peak Flow --      Pain Score 03/14/24 1501 2     Pain Loc --      Pain Education --      Exclude from Growth Chart --     Most recent vital signs: Vitals:   03/14/24 1458  BP: (!) 160/89  Pulse: 90  Resp: 17  Temp: (!) 97.5 F (36.4 C)  SpO2: 95%   General: Awake, alert, oriented. CV:  Good peripheral perfusion. Regular rate and rhythm. Resp:  Normal effort. Lungs clear. Abd:  No distention.  Ext:  Trace bilateral lower extremity edema.   ED Results / Procedures / Treatments   Labs (all labs ordered are listed, but only abnormal results are displayed) Labs Reviewed  BASIC  METABOLIC PANEL WITH GFR - Abnormal; Notable for the following components:      Result Value   Glucose, Bld 173 (*)    Calcium  8.2 (*)    All other components within normal limits  CBC - Abnormal; Notable for the following components:   Hemoglobin 12.4 (*)    Platelets 147 (*)    All other components within normal limits  D-DIMER, QUANTITATIVE - Abnormal; Notable for the following components:   D-Dimer, Quant 0.74 (*)    All other components within normal limits  TROPONIN I (HIGH SENSITIVITY) - Abnormal; Notable for the following components:   Troponin I (High Sensitivity) 46 (*)    All other components within normal limits  TROPONIN I (HIGH SENSITIVITY) - Abnormal; Notable for the following components:   Troponin I (High Sensitivity) 44 (*)    All other components within normal limits  BRAIN NATRIURETIC PEPTIDE     EKG  I, Guadalupe Eagles, attending physician, personally viewed and interpreted this EKG  EKG Time: 1508  Rate: 95 Rhythm: sinus rhythm with PVC Axis: normal Intervals: qtc 497 QRS: narrow, q waves v1 ST changes: no st elevation Impression: abnormal ekg  RADIOLOGY I independently interpreted and visualized the CXR. My interpretation: Bilateral lower lung opacities. Radiology interpretation:  IMPRESSION:  Increased bibasilar opacities as noted above.        PROCEDURES:  Critical Care performed: No    MEDICATIONS ORDERED IN ED: Medications - No data to display   IMPRESSION / MDM / ASSESSMENT AND PLAN / ED COURSE  I reviewed the triage vital signs and the nursing notes.                              Differential diagnosis includes, but is not limited to, ACS, esophagitis, pneumonia, PE, dissection  Patient's presentation is most consistent with acute presentation with potential threat to life or bodily function.   The patient is on the cardiac monitor to evaluate for evidence of arrhythmia and/or significant heart rate changes.  Patient  presented to the emergency department today because of concerns for chest tightness.  He also complains of bilateral leg swelling.  On exam patient in no respiratory distress.  Lungs are clear.  Troponin was slightly elevated here in the emergency department although did not have any significant change on repeat testing.  Given swelling chest pain I did check a D-dimer.  This was slightly elevated.  Unfortunate patient has iodine  contrast allergy so we will have to have 4-hour prep.  Awaiting CT scan at time of signout.      FINAL CLINICAL IMPRESSION(S) / ED DIAGNOSES   Chest pain    Note:  This document was prepared using Dragon voice recognition software and may include unintentional dictation errors.    Floy Roberts, MD 03/14/24 [phone]

## 2024-03-15 ENCOUNTER — Emergency Department

## 2024-03-15 DIAGNOSIS — R0789 Other chest pain: Secondary | ICD-10-CM | POA: Diagnosis not present

## 2024-03-15 MED ORDER — LEVOFLOXACIN 500 MG PO TABS
750.0000 mg | ORAL_TABLET | Freq: Once | ORAL | Status: AC
  Administered 2024-03-15: 750 mg via ORAL
  Filled 2024-03-15: qty 2

## 2024-03-15 MED ORDER — LEVOFLOXACIN 750 MG PO TABS: 750.0000 mg | ORAL_TABLET | Freq: Every day | ORAL | 0 refills | Status: AC

## 2024-03-15 MED ORDER — IOHEXOL 350 MG/ML SOLN
100.0000 mL | Freq: Once | INTRAVENOUS | Status: AC | PRN
  Administered 2024-03-15: 75 mL via INTRAVENOUS

## 2024-03-15 NOTE — Discharge Instructions (Addendum)
 There was some inflammation changes in your lungs that may represent early lung infection for which you are started on an antibiotic to take for full 5 days.  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  Discussed with your doctor to get this monitoring scheduled.  Thank you for choosing us  for your health care today!  Please see your primary doctor this week for a follow up appointment.   If you have any new, worsening, or unexpected symptoms call your doctor right away or come back to the emergency department for reevaluation.  It was my pleasure to care for you today.

## 2024-03-15 NOTE — ED Provider Notes (Signed)
 Signed out to me in stable condition pending the results of a CT angiogram of the chest. CT injury of the chest negative for PE but does show some inflammatory changes consistent with early infection.  He does not have leukocytosis, so may be representative of very early infection given his age and medical comorbidities I will start him on levofloxacin with first dose in the Emergency Department.  He had been documented with low oxygen  levels per nursing report around 89% on room air and so was put on nasal cannula oxygen .  He has a history of COPD so I think that this is okay oxygen  level at baseline, he does not have oxygen  at home at baseline, I have taken him off oxygen  and he is maintaining oxygen  saturations around 93% to 96%.  He feels well at this time feels comfortable with plan for discharge home PMD follow-up on outpatient antibiotics and knows to return to the Emergency Department should he develop any new or worsening symptoms.   Cyrena Mylar, MD 03/15/24 [phone]

## 2024-03-15 NOTE — ED Notes (Signed)
 Pt to CT

## 2024-03-31 NOTE — Discharge Summary (Signed)
 ------------------------------------------------------------------------------- Attestation signed by Drusilla Belvie Sharper, MD at 04/01/24 [phone] I, Dr. Belvie Sharper Drusilla, MD was the attending physician of record. I agree with the findings and the plan as documented in the APP's note.  Some persistent hyperglycemia after prednisone  for COPD exacerbation. Suspect this will improve and, given age, opted for monitoring and close outpatient follow-up rather than new start long-acting insulin  given reasonable baseline A1c. -------------------------------------------------------------------------------   Physician Discharge Summary [facility] [place] [address] [place] [address] Dept: [phone] Loc: [phone]   Identifying Information:  Abdulwahab Demelo 1941-07-07 [id number]  Primary Care Physician: Sampson Ethridge Kingdom, MD  Code Status: Full Code  Admit Date: 03/23/2024  Discharge Date: 03/31/2024   Discharge To: Home with Home Health and/or PT/OT  Discharge Service: [facility]   Discharging Provider: Belvie Sharper Drusilla, MD  Discharge Diagnoses: Principal Problem:   Chest pain (POA: Yes) Active Problems:   Hypertension associated with type 2 diabetes mellitus    (POA: Yes)   Tobacco use disorder (POA: Yes)   COPD (chronic obstructive pulmonary disease)    (POA: Yes)   CKD stage 2 due to type 2 diabetes mellitus    (POA: Yes)   Gastroesophageal reflux disease (POA: Yes)   Hyperlipidemia, mixed (POA: Yes)   Benign prostatic hyperplasia with nocturia (POA: Yes)   DDD (degenerative disc disease), lumbar (POA: Yes)   Bilateral carotid artery stenosis (POA: Yes)   Iron  deficiency anemia (POA: Yes)   TIA (transient ischemic attack) (POA: Yes)   Anxiety (POA: Yes)   Peripheral vascular disease (CMS-HCC) (POA: Yes)   SVT (supraventricular tachycardia) (HHS-HCC) (POA: Yes)   Heart failure with recovered ejection fraction  (HFrecEF)    (POA: Yes)   CAD (coronary artery disease) (POA: Unknown)   NSTEMI (non-ST elevated myocardial infarction)    (POA: Unknown) Resolved Problems:   * No resolved hospital problems. *   Outpatient Provider Follow Up Issues:  Hyperglycemia--glucoses elevated, at least partially due to steroid course, anticipate improvement but remain elevated in upper 290s intermittently--[name] will keep record of glucoses over the next several days and bring record to PCP appointment.  Anxiety--reports intermittent issues with anxiety, specifically after stopping hydroxyzine when hydroxychlorquine was started for RA. Consider alternative treatment?  Hospital Course:  [name] is a 83 y.o. male whose presentation is complicated by CAD, NSTEMI (03/2023), HFrecoveredEF (EF 40-45%-->65-70%), HTN, HLD, T2DM, SVT (on Metoprolol ), bilateral CAS, TIA, PVD s/p balloon angioplasty in Feb 2022, DVT (now off AC), COPD, TUD, DDD, CKD2, GERD, BPH, IDA, anxiety that presented to [facility] with chest pain, productive cough, and shortness of breath, found to have elevated troponin and c/f MF PNA and admitted for further workup.      Active Problems   AHRF  LLL PNA  COPD exacerbation, mild AHRF 2/2 LLL PNA & AECOPD - 3 weeks SOB, productive cough. PTA completed 2 courses of levaquin  and prednisone  with recurrence.  CTA-C (6/27) mod emphysema and bronchial wall thickening, airway impaction within RLL. Repeat CT-C on admission with scattered distal mucous plugging with likely superimposed bronchiolitis in LLL. He finished 3 days azithromycin , IV CTX -> PO cefdinir for CAP tx. He req'd 5L on admission, weaned to RA. He is on AECOPD tx with scheduled and PRN nebs, Mucinex , pulm toileting for airway clearance.    He had chest pain on admission with mildly elevated troponin, suspect demand ischemia d/t resp failure & acute illness. Chest pain described as  tightness, not as angina. He does have hx of CAD and  NSTEMI, continued on BB and statin. He has hx of HFrecEF. ProBNP was minimally elevated, CXR no pulm edema. He has increased BLE and abdominal distention. Weights have been inconsistent with different scales. He was started on PO furosemide on 7/9. He had unchanged edema on 7/10 and was given 40 IV lasix with improvement.  Patient reported chest pain with nausea on 7/10. ECG was unchanged and trop was elevated to 804-->871-->717. Symptoms resolved without intervention. PO lasix was repeated on 7/11 and 7/12 with minimal residual edema at time of discharge.  He has longstanding smoking history, has been on nicotine  patch.    He is on metformin for NIDDM2, A1C 7.6%. He had been on SSI for steroid-induced hyperglycemia which was stopped d/t dexterity. Glucose should improve after steroid course completed.   Diabetes was treated with insulin  which was stopped at time of transfer to [facility] when metformin was restarted. Glucoses remained elevated x several days after prednisone  course completed. He received insulin  7 units x 1 for glucose of 405. Glucose improved and was in the 200s at time of discharge. He will continue to monitor glucoses at home and will report numbers at his follow up appointment with his PCP this week.  Procedures: None No admission procedures for hospital encounter. ______________________________________________________________________ Discharge Medications:   Medication List    START taking these medications   . senna 8.6 mg tablet; Commonly known as: SENOKOT; Take 2 tablets by mouth  daily as needed for constipation.   CONTINUE taking these medications   . acetaminophen  500 MG tablet; Commonly known as: TYLENOL  . * albuterol  2.5 mg /3 mL (0.083 %) nebulizer solution; INHALE 3 ML (2.5  MG TOTAL) BY NEBULIZATION EVERY FOUR (4) HOURS AS NEEDED FOR WHEEZING. . * albuterol  90 mcg/actuation inhaler; Commonly known as: PROVENTIL   HFA;VENTOLIN  HFA; Inhale 2 puffs every six (6) hours  as needed for  wheezing or shortness of breath. . amlodipine  2.5 MG tablet; Commonly known as: NORVASC  . aspirin  81 MG tablet; Commonly known as: ECOTRIN . atorvastatin  80 MG tablet; Commonly known as: LIPITOR; Take 1 tablet (80  mg total) by mouth daily. . clopidogrel  75 mg tablet; Commonly known as: PLAVIX ; TAKE 1 TABLET BY  MOUTH EVERY DAY . folic acid  1 MG tablet; Commonly known as: FOLVITE  . hydroxychloroquine 200 mg tablet; Commonly known as: PLAQUENIL . inhalational spacing device Spcr; 1 each by Miscellaneous route two (2)  times a day. . lidocaine  4 % cream; Commonly known as: LMX . lidocaine  4 % patch; Commonly known as: ASPERCREME . magnesium  oxide 400 mg (241.3 mg elemental) tablet; Commonly known as:  MAG-OX; Take 1 tablet (400 mg total) by mouth daily. . metFORMIN 850 MG tablet; Commonly known as: GLUCOPHAGE; TAKE 1 TABLET  (850 MG TOTAL) BY MOUTH DAILY. FOR DIABETES . metoPROLOL  succinate 25 MG 24 hr tablet; Commonly known as: Toprol -XL;  Take 1 tablet (25 mg total) by mouth daily. . mirtazapine  15 MG tablet; Commonly known as: REMERON ; Take 1 tablet (15  mg total) by mouth nightly. . nitroglycerin  0.4 MG SL tablet; Commonly known as: NITROSTAT ; Place 1  tablet (0.4 mg total) under the tongue every five (5) minutes as needed  for chest pain. . ondansetron  4 MG tablet; Commonly known as: ZOFRAN ; TAKE 1 TABLET BY  MOUTH TWICE A DAY AS NEEDED FOR NAUSEA . ONETOUCH DELICA PLUS LANCET 33 gauge Misc; Generic drug: lancets; USE 1  LANCET  BY OTHER ROUTE TWO (2) TIMES A DAY. SABRA JAN ULTRA TEST Strp; Generic drug: blood sugar diagnostic; USE AS  DIRECTED TWICE A DAY . pantoprazole  40 MG tablet; Commonly known as: Protonix ; TAKE 1 TABLET BY  MOUTH EVERY DAY . polyethylene glycol 17 gram/dose powder; Commonly known as: GLYCOLAX;  Take 17 g by mouth daily as needed (constipation). . tamsulosin  0.4 mg capsule; Commonly known as: FLOMAX  . TRELEGY ELLIPTA 100-62.5-25 mcg  inhaler; Generic drug:  fluticasone-umeclidin-vilanter; Inhale 1 puff daily. Rinse mouth with  water after use * This list has 2 medication(s) that are the same as other medications  prescribed for you. Read the directions carefully, and ask your doctor or  other care provider to review them with you.   STOP taking these medications   . empagliflozin 10 mg tablet; Commonly known as: JARDIANCE . hydrOXYzine 10 MG tablet; Commonly known as: ATARAX    Allergies: Iodinated contrast media, Ioxaglate sodium, Lisinopril , Oxycodone -acetaminophen , Codeine, and Aspirin  ______________________________________________________________________ Pending Test Results (if blank, then none):   Most Recent Labs: All lab results last 24 hours -  Recent Results (from the past 24 hours)  POCT Glucose   Collection Time: 03/30/24  7:30 PM  Result Value Ref Range   Glucose, POC 262 (A) 65 - 179 mg/dL   Glucose Strip Lot Num patient supply    Glucose Strip Exp patient supply    Serial Number      Relevant Studies/Radiology (if blank, then none): ECG 12 Lead Result Date: 03/29/2024 Borderline ECG **Unconfirmed** Sinus rhythm Lateral ST-T abnormality is nonspecific Confirmed by Von Shawl [phone]) on 03/29/2024 9:35:39 AM  ECG 12 Lead Result Date: 03/28/2024 Abnormal ECG **Unconfirmed** Sinus rhythm Widespread ST-T abnormality may be due to myocardial ischemia  Echocardiogram W Colorflow Spectral Doppler Result Date: 03/25/2024 Patient Info Name:     [name] Age:     83 years DOB:     [birth year] Gender:     Male MRN:     [id number] Accession #:     [id number] UN Account #:     [id number] Ht:     170 cm Wt:     79 kg BSA:     1.95 m2 BP:     157 /     80 mmHg HR:     83 bpm Exam Date:     03/25/2024 9:34 AM Admit Date:     03/23/2024 Exam Type:     ECHOCARDIOGRAM W COLORFLOW SPECTRAL DOPPLER Technical Quality:     Fair Staff Sonographer:     Cletus Daring Reading Fellow:     Elspeth Norman MD Ordering  Physician:     Schuyler JAYSON Breeding Study Info Indications      - AHRF Procedure(s)   Complete two-dimensional, color flow and Doppler transthoracic echocardiogram is performed. Summary   1. Technically difficult study.   2. The left ventricle is normal in size with mildly to moderately increased wall thickness.   3. The left ventricular systolic function is normal, LVEF is visually estimated at > 55%.   4. The right ventricle is normal in size, with normal systolic function.   5. The aorta is upper normal in size in the visualized segments. Left Ventricle   The left ventricle is normal in size with mildly to moderately increased wall thickness. The left ventricular systolic function is normal, LVEF is visually estimated at > 55%. There is age related grade I diastolic dysfunction. Right Ventricle   The right ventricle  is normal in size, with normal systolic function. Left Atrium   The left atrium is normal in size. Right Atrium   The right atrium is upper normal in size. Aortic Valve   The aortic valve is not well visualized. There is no significant aortic regurgitation. There is no evidence of a significant transvalvular gradient. Mitral Valve   The mitral valve leaflets are normal with normal leaflet mobility. There is no significant mitral valve regurgitation. Tricuspid Valve   The tricuspid valve leaflets are normal, with normal leaflet mobility. There is no significant tricuspid regurgitation. The pulmonary systolic pressure cannot be estimated due to insufficient TR signal. Pulmonic Valve   The pulmonic valve is poorly visualized. Aorta   The aorta is upper normal in size in the visualized segments. Inferior Vena Cava   The IVC is not well visualized precluding the ability to accurate assess right atrial pressure. Pericardium/Pleural   There is no evidence of a significant pericardial effusion. Ventricles ---------------------------------------------------------------------- Name                                  Value        Normal ---------------------------------------------------------------------- LV Dimensions 2D/MM ----------------------------------------------------------------------  IVS Diastolic Thickness (2D)                                1.4 cm       0.6-1.0 LVID Diastole (2D)                  4.5 cm       4.2-5.8  LVPW Diastolic Thickness (2D)                                1.3 cm       0.6-1.0 LVID Systole (2D)                   3.2 cm       2.5-4.0 LVOT Diameter                       2.0 cm               LV Mass Index (2D Cubed)          120 g/m2        49-115  Relative Wall Thickness (2D)                                  0.58        <=0.42 RV Dimensions 2D/MM ---------------------------------------------------------------------- TAPSE                               2.2 cm         >=1.7 Atria ---------------------------------------------------------------------- Name                                 Value        Normal ---------------------------------------------------------------------- LA Dimensions ---------------------------------------------------------------------- LA Dimension (2D)                   4.5 cm       3.0-4.1  LA Volume Index (4C A-L)        17.10 ml/m2               LA Volume Index (2C A-L)        31.00 ml/m2               LA Volume (BP MOD)                   43 ml               LA Volume Index (BP MOD)        22.00 ml/m2   16.00-34.00 RA Dimensions ---------------------------------------------------------------------- RA Area (4C)                      22.4 cm2        <=18.0 RA Area (4C) Index              11.5 cm2/m2               RA ESV Index (4C MOD)             31 ml/m2         18-32 Left Ventricular Outflow Tract ---------------------------------------------------------------------- Name                                 Value        Normal ---------------------------------------------------------------------- LVOT 2D ----------------------------------------------------------------------  LVOT Diameter                       2.0 cm               LVOT Area                          3.1 cm2               LVOT Doppler ---------------------------------------------------------------------- LVOT Peak Velocity                 1.0 m/s Aortic Valve ---------------------------------------------------------------------- Name                                 Value        Normal ---------------------------------------------------------------------- AV Doppler ---------------------------------------------------------------------- AV Peak Velocity                   1.5 m/s               AV Peak Gradient                    9 mmHg               AV Area (Cont Eq Vel)              2.2 cm2               AV Area Index (Cont Eq Vel)     1.1 cm2/m2               AV DI (Vel)                           0.69 Mitral Valve ---------------------------------------------------------------------- Name  Value        Normal ---------------------------------------------------------------------- MV Diastolic Function ---------------------------------------------------------------------- MV E Peak Velocity                 78 cm/s               MV A Peak Velocity                122 cm/s               MV E/A                                 0.6               MV Annular TDI ---------------------------------------------------------------------- MV Septal e' Velocity             7.7 cm/s         >=8.0 MV E/e' (Septal)                      10.1               MV Lateral e' Velocity            6.0 cm/s        >=10.0 MV E/e' (Lateral)                     13.0               MV e' Average                     6.9 cm/s               MV E/e' (Average)                     11.5 Aorta ---------------------------------------------------------------------- Name                                 Value        Normal ---------------------------------------------------------------------- Ascending Aorta  ---------------------------------------------------------------------- Ao Root Diameter (2D)               3.7 cm               Ao Root Diam Index (2D)          1.9 cm/m2 Venous ---------------------------------------------------------------------- Name                                 Value        Normal ---------------------------------------------------------------------- IVC/SVC ---------------------------------------------------------------------- IVC Diameter (Exp 2D)               1.7 cm         <=2.1 Report Signatures Amended by Nunzio Sherrod Ann  MD on 03/25/2024 04:21 PM Finalized by Nunzio Sherrod Ann  MD on 03/25/2024 04:20 PM Resident Elspeth Norman  MD on 03/25/2024 01:13 PM  XR Abdomen 1 View Result Date: 03/24/2024 EXAM: XR ABDOMEN 1 VIEW ACCESSION: [id number] UN REPORT DATE: 03/24/2024 1:19 PM CLINICAL INDICATION: 83 years old with CONSTIPATION  COMPARISON: No prior TECHNIQUE: Supine view of the abdomen, 2 image(s) FINDINGS: Moderate-large volume stool burden is seen throughout the colon. Small bowel is not distended. There is no findings for obstruction. Retroperitoneal  structures are normal. There are no unusual calcifications overlying the abdomen or pelvis. Vascular calcifications are seen within the common iliac vessels   Moderate volume stool burden. No findings for obstruction   CT Chest Wo Contrast Result Date: 03/24/2024 EXAM: CT CHEST WO CONTRAST ACCESSION: [id number] UN REPORT DATE: 03/24/2024 11:31 AM CLINICAL INDICATION: AHRF, c/f MF PNA TECHNIQUE: Contiguous noncontrast axial images were reconstructed through the chest following a single breath hold helical acquisition. Images were reformatted in the axial. coronal, and sagittal planes. MIP slabs were also constructed. COMPARISON: CT LUNG CANCER SCREENING LOW DOSE 09/06/2019 FINDINGS: LUNGS/AIRWAYS/PLEURA: Trachea is patent. Mild diffuse bronchial wall thickening with a scattered distal mucous plugging. Upper lung  predominant centrilobular and paraseptal emphysema. Elevated left hemidiaphragm with adjacent atelectasis. Clustered nodules and patchy groundglass opacities in the left lower lobe. Subsegmental atelectasis of right lower lobe. No new or enlarging pulmonary nodule. 5 mm right upper lobe nodule (2:212) is unchanged. Lungs are clear. No pleural effusion or pneumothorax. MEDIASTINUM/THORACIC INLET: No enlarged supraclavicular or intrathoracic lymph nodes. Few subcentimeter mediastinal lymph nodes are nonspecific and unchanged since 2020. No mediastinal mass or thyroid  abnormality. HEART/VASCULATURE: Cardiac chambers are normal in size. Moderate coronary artery calcifications. No pericardial effusion. Normal caliber aorta with mild calcifications. Main pulmonary artery is normal in size. UPPER ABDOMEN: Vascular calcifications are present. Unchanged left hepatic lesions, likely cysts. CHEST WALL/BONES: No enlarged axillary lymph nodes. New chronic appearing mild compression deformity of T4. No suspicious lytic or sclerotic lesions. DEVICES: None.   Bronchitis with scattered distal mucous plugging. Suspected superimposed bronchiolitis in the left lower lobe. Bibasilar atelectasis. New chronic appearing T4 compression deformity.   XR Chest 2 views Result Date: 03/23/2024 EXAM: XR CHEST 2 VIEWS ACCESSION: [id number] UN REPORT DATE: 03/23/2024 6:26 PM CLINICAL INDICATION: CHEST PAIN  TECHNIQUE: PA and Lateral Chest Radiographs COMPARISON: Radiograph 09/08/2023 FINDINGS: New patchy opacifications in the right lung base. Bibasilar atelectasis. No pleural effusion or pneumothorax. Enlarged cardiac silhouette. Aortic arch calcifications. Unchanged eventration of the left hemidiaphragm.   Right greater than left basilar opacities which may represent multifocal aspiration or infection.   ECG 12 Lead Result Date: 03/23/2024 NORMAL SINUS RHYTHM NON-SPECIFIC ST/T WAVE CHANGES PROLONGED QT ABNORMAL ECG WHEN COMPARED WITH ECG OF  08-Sep-2023 10:53, PREMATURE VENTRICULAR BEATS ARE NO LONGER PRESENT NONSPECIFIC T WAVE ABNORMALITY NO LONGER EVIDENT IN INFERIOR LEADS Confirmed by Cleotilde Moccasin ([id number]) on 03/23/2024 5:13:17 PM  ______________________________________________________________________ Discharge Instructions:      Other Instructions     Call MD for:     Chest pain, persistent nausea, shortness of breath, worsening cough, increase in swelling in lower extremities or abdomen   Call MD for:  difficulty breathing, headache or visual disturbances     Call MD for:  extreme fatigue     Call MD for:  persistent dizziness or light-headedness     Call MD for:  persistent nausea or vomiting     Call MD for:  severe uncontrolled pain     Call MD for: Temperature > 38.5 Celsius ( > 101.3 Fahrenheit)     Discharge instructions     Please check your glucose levels 3 times daily and keep record of numbers. Take record to follow up with PCP this week as discussed.   Discharge instructions to patient: Call your primary care doctor and make an appointment to see them:     Within 2 weeks from the time you are discharged from the hospital       Follow Up instructions  and Outpatient Referrals    Ambulatory Referral to Home Health     Reason for referral: Therapy   Is this a [facility] or [facility] Patient?: Yes   Do you want agency provider parameter notifications or patient specific  provider parameter notifications?: Agency   Do you want to initiate remote patient monitoring?: No   Physician to follow patient's care: PCP   Disciplines requested:  Physical Therapy Occupational Therapy Home Health Aide     Physical Therapy requested: Evaluate and treat   Occupational Therapy Requested: Evaluate and treat   Call MD for:     Call MD for:  difficulty breathing, headache or visual disturbances     Call MD for:  extreme fatigue     Call MD for:  persistent dizziness or light-headedness     Call MD for:  persistent  nausea or vomiting     Call MD for:  severe uncontrolled pain     Call MD for: Temperature > 38.5 Celsius ( > 101.3 Fahrenheit)     Discharge instructions        ______________________________________________________________________ Discharge Day Services: BP 154/96   Pulse 84   Temp 36.9 C (98.4 F) (Oral)   Resp 16   Ht 170.2 cm (5' 7.01)   Wt 71 kg (156 lb 9.6 oz)   SpO2 96%   BMI 24.52 kg/m  Patient seen on the day of discharge via virtual video visit and was deemed appropriate for discharge.  Condition at Discharge: good  Length of Discharge: I spent greater than 30 mins in the discharge of this patient.  The patient's primary care provider (Entzminger, Ethridge Kingdom, MD) was unable to be reached for a handoff, dc summary routed to PCP  The patient and/or parent/guardian has been advised of the potential risks and limitations of this mode of treatment (including, but not limited to, the absence of in-person examination) and has agreed to be treated using telemedicine. The  Patient's, patient's guardian's and/or patient's family's questions regarding telemedicine have been answered.

## 2024-04-19 ENCOUNTER — Encounter: Payer: Self-pay | Admitting: Intensive Care

## 2024-04-19 ENCOUNTER — Other Ambulatory Visit: Payer: Self-pay

## 2024-04-19 ENCOUNTER — Emergency Department

## 2024-04-19 ENCOUNTER — Emergency Department
Admission: EM | Admit: 2024-04-19 | Discharge: 2024-04-19 | Disposition: A | Discharge status: Home / Self Care | Attending: Emergency Medicine | Admitting: Emergency Medicine

## 2024-04-19 DIAGNOSIS — E876 Hypokalemia: Secondary | ICD-10-CM | POA: Diagnosis not present

## 2024-04-19 DIAGNOSIS — I129 Hypertensive chronic kidney disease with stage 1 through stage 4 chronic kidney disease, or unspecified chronic kidney disease: Secondary | ICD-10-CM | POA: Diagnosis not present

## 2024-04-19 DIAGNOSIS — J449 Chronic obstructive pulmonary disease, unspecified: Secondary | ICD-10-CM | POA: Diagnosis not present

## 2024-04-19 DIAGNOSIS — E860 Dehydration: Secondary | ICD-10-CM | POA: Diagnosis not present

## 2024-04-19 DIAGNOSIS — E1122 Type 2 diabetes mellitus with diabetic chronic kidney disease: Secondary | ICD-10-CM | POA: Diagnosis not present

## 2024-04-19 DIAGNOSIS — E871 Hypo-osmolality and hyponatremia: Secondary | ICD-10-CM | POA: Insufficient documentation

## 2024-04-19 DIAGNOSIS — I2510 Atherosclerotic heart disease of native coronary artery without angina pectoris: Secondary | ICD-10-CM | POA: Diagnosis not present

## 2024-04-19 DIAGNOSIS — R0789 Other chest pain: Secondary | ICD-10-CM | POA: Insufficient documentation

## 2024-04-19 DIAGNOSIS — N189 Chronic kidney disease, unspecified: Secondary | ICD-10-CM | POA: Diagnosis not present

## 2024-04-19 LAB — BASIC METABOLIC PANEL WITH GFR
Anion gap: 11 (ref 5–15)
BUN: 13 mg/dL (ref 8–23)
CO2: 25 mmol/L (ref 22–32)
Calcium: 8.7 mg/dL — ABNORMAL LOW (ref 8.9–10.3)
Chloride: 98 mmol/L (ref 98–111)
Creatinine, Ser: 1.36 mg/dL — ABNORMAL HIGH (ref 0.61–1.24)
GFR, Estimated: 52 mL/min — ABNORMAL LOW (ref >60)
Glucose, Bld: 185 mg/dL — ABNORMAL HIGH (ref 70–99)
Potassium: 3.4 mmol/L — ABNORMAL LOW (ref 3.5–5.1)
Sodium: 134 mmol/L — ABNORMAL LOW (ref 135–145)

## 2024-04-19 LAB — CBC
HCT: 40.5 % (ref 39.0–52.0)
Hemoglobin: 12.6 g/dL — ABNORMAL LOW (ref 13.0–17.0)
MCH: 26.9 pg (ref 26.0–34.0)
MCHC: 31.1 g/dL (ref 30.0–36.0)
MCV: 86.5 fL (ref 80.0–100.0)
Platelets: 153 K/uL (ref 150–400)
RBC: 4.68 MIL/uL (ref 4.22–5.81)
RDW: 14.4 % (ref 11.5–15.5)
WBC: 8 K/uL (ref 4.0–10.5)
nRBC: 0 % (ref 0.0–0.2)

## 2024-04-19 LAB — TROPONIN I (HIGH SENSITIVITY)
Troponin I (High Sensitivity): 34 ng/L — ABNORMAL HIGH (ref <18)
Troponin I (High Sensitivity): 34 ng/L — ABNORMAL HIGH (ref <18)

## 2024-04-19 MED ORDER — SODIUM CHLORIDE 0.9 % IV BOLUS: 500.0000 mL | Freq: Once | INTRAVENOUS | Status: DC

## 2024-04-19 MED ORDER — KETOROLAC TROMETHAMINE 15 MG/ML IJ SOLN
7.5000 mg | Freq: Once | INTRAMUSCULAR | Status: AC
  Administered 2024-04-19: 7.5 mg via INTRAMUSCULAR
  Filled 2024-04-19: qty 1

## 2024-04-19 NOTE — ED Provider Notes (Signed)
 [facility] Provider Note    Event Date/Time   First MD Initiated Contact with Patient 04/19/24 1641     (approximate)   History   Chief Complaint: Weakness   HPI  [name] is a 83 y.o. male with a history of COPD, GERD, diabetes, hypertension, CAD, CKD who reports left-sided chest discomfort occurring earlier today.  Hurts with movement.  Started after he had crouched down and had to really strain to lift himself back up.  Denies shortness of breath or fever or exertional symptoms.  He does note that with his schedule for today, he did not have anything to eat or drink prior to his OT session during which this happened      Past Medical History:  Diagnosis Date   Atypical chest pain    a. 05/2011 Neg MV; b. 04/2013 Neg MV; c. 04/2019 Echo: EF 55-60%, mild BAE. Degen MV dzs. Nl RV fxn; d. 06/2019 MV: EF 55%, small, mild, fixed apical and apical ant defect - probable artifact. No ischemia.   COPD (chronic obstructive pulmonary disease) (HCC)    Coronary artery calcification seen on CT scan    a. 07/2018 CT Chest - cor Ca2+.   Diabetes mellitus without complication (HCC)    Erosive gastropathy    a. 06/2019 EGD   GERD (gastroesophageal reflux disease)    Hypertension    Pulmonary nodules    a. 07/2018 Chest CT: RML and RLL nodules.   TIA (transient ischemic attack)    Vertigo    no episodes for several years   Wears dentures    full upper and lower    Current Outpatient Rx   Order #: [id number] Class: Historical Med   Order #: [id number] Class: Historical Med   Order #: [id number] Class: Historical Med   Order #: [id number] Class: Historical Med   Order #: [id number] Class: Historical Med   Order #: [id number] Class: Normal   Order #: [id number] Class: Historical Med   Order #: [id number] Class: Normal   Order #: [id number] Class: Historical Med   Order #: [id number] Class: Historical Med   Order #: [id number] Class: Historical Med   Order #: [id number] Class:  Historical Med   Order #: [id number] Class: Historical Med   Order #: [id number] Class: Historical Med   Order #: [id number] Class: Normal   Order #: [id number] Class: Historical Med   Order #: [id number] Class: Historical Med   Order #: [id number] Class: Historical Med   Order #: [id number] Class: Historical Med   Order #: [id number] Class: Historical Med   Order #: [id number] Class: Print   Order #: [id number] Class: Historical Med   Order #: [id number] Class: Historical Med   Order #: [id number] Class: Historical Med   Order #: [id number] Class: Historical Med    Past Surgical History:  Procedure Laterality Date   CARDIAC CATHETERIZATION  2024   [facility]   CATARACT EXTRACTION W/PHACO Right 12/28/2023   Procedure: PHACOEMULSIFICATION, CATARACT, WITH IOL INSERTION 12.39 01:23.4;  Surgeon: Enola Feliciano Hugger, MD;  Location: [facility];  Service: Ophthalmology;  Laterality: Right;   EYE SURGERY     LOWER EXTREMITY ANGIOGRAPHY Left 09/29/2020   Procedure: LOWER EXTREMITY ANGIOGRAPHY;  Surgeon: Jama Cordella MATSU, MD;  Location: [facility];  Service: Cardiovascular;  Laterality: Left;   LOWER EXTREMITY ANGIOGRAPHY Left 11/10/2020   Procedure: LOWER EXTREMITY ANGIOGRAPHY;  Surgeon: Jama Cordella MATSU, MD;  Location: [facility];  Service: Cardiovascular;  Laterality: Left;    Physical Exam   Triage Vital Signs: ED Triage Vitals  Encounter Vitals Group  BP 04/19/24 1309 (!) 153/88     Girls Systolic BP Percentile --      Girls Diastolic BP Percentile --      Boys Systolic BP Percentile --      Boys Diastolic BP Percentile --      Pulse Rate 04/19/24 1309 81     Resp 04/19/24 1309 20     Temp 04/19/24 1309 98 F (36.7 C)     Temp Source 04/19/24 1309 Oral     SpO2 04/19/24 1309 95 %     Weight 04/19/24 1311 159 lb (72.1 kg)     Height 04/19/24 1311 5' 7 (1.702 m)     Head Circumference --      Peak Flow --      Pain Score 04/19/24 1310 4     Pain Loc --      Pain  Education --      Exclude from Growth Chart --     Most recent vital signs: Vitals:   04/19/24 1643 04/19/24 1900  BP:  (!) 154/70  Pulse:    Resp:  17  Temp: 98.1 F (36.7 C)   SpO2:      General: Awake, no distress.  CV:  Good peripheral perfusion.  Regular rate rhythm Resp:  Normal effort.  Clear to auscultation bilaterally Abd:  No distention.  Soft nontender Other:  Left lateral chest wall tender to the touch reproducing his pain   ED Results / Procedures / Treatments   Labs (all labs ordered are listed, but only abnormal results are displayed) Labs Reviewed  BASIC METABOLIC PANEL WITH GFR - Abnormal; Notable for the following components:      Result Value   Sodium 134 (*)    Potassium 3.4 (*)    Glucose, Bld 185 (*)    Creatinine, Ser 1.36 (*)    Calcium  8.7 (*)    GFR, Estimated 52 (*)    All other components within normal limits  CBC - Abnormal; Notable for the following components:   Hemoglobin 12.6 (*)    All other components within normal limits  TROPONIN I (HIGH SENSITIVITY) - Abnormal; Notable for the following components:   Troponin I (High Sensitivity) 34 (*)    All other components within normal limits  TROPONIN I (HIGH SENSITIVITY) - Abnormal; Notable for the following components:   Troponin I (High Sensitivity) 34 (*)    All other components within normal limits     EKG Interpreted by me Sinus rhythm rate of 81.  Normal axis and intervals.  Poor R wave progression.  No acute ischemic changes.  Unchanged compared to previous EKG on 03/18/2024   RADIOLOGY Chest x-ray interpreted by me, unremarkable.  Radiology report reviewed   PROCEDURES:  Procedures   MEDICATIONS ORDERED IN ED: Medications  ketorolac  (TORADOL ) 15 MG/ML injection 7.5 mg (7.5 mg Intramuscular Given 04/19/24 1740)     IMPRESSION / MDM / ASSESSMENT AND PLAN / ED COURSE  I reviewed the triage vital signs and the nursing notes.  DDx: Chest wall muscle strain,  pneumothorax, pneumonia, NSTEMI, arrhythmia, AKI  Patient's presentation is most consistent with acute presentation with potential threat to life or bodily function.  Patient presents with left-sided chest pain, clearly reproducible musculoskeletal pain.  Vital signs unremarkable.  EKG chest x-ray labs all baseline.  Patient only desires pain control at this point, refuses IV fluids for additional hydration.  Stable for discharge       FINAL CLINICAL IMPRESSION(S) /  ED DIAGNOSES   Final diagnoses:  Chest wall pain  Dehydration     Rx / DC Orders   ED Discharge Orders     None        Note:  This document was prepared using Dragon voice recognition software and may include unintentional dictation errors.   Viviann Pastor, MD 04/19/24 1929

## 2024-04-19 NOTE — ED Triage Notes (Addendum)
 Arrived by [facility] from home: Two weeks ago diagnosed with pneumonia. Started feeling weak this AM after physical therapy at his home  EMS vitals:  167/82 97% RA 80HR

## 2024-04-19 NOTE — ED Notes (Signed)
 Patient provided box meal and soda. Patient up in bed feeding self.

## 2024-04-19 NOTE — ED Triage Notes (Signed)
 See previous triage note. Patient also c/o chest discomfort during OT and near syncope.

## 2024-05-27 ENCOUNTER — Ambulatory Visit (INDEPENDENT_AMBULATORY_CARE_PROVIDER_SITE_OTHER): Payer: 59 | Admitting: Nurse Practitioner

## 2024-05-27 ENCOUNTER — Encounter (INDEPENDENT_AMBULATORY_CARE_PROVIDER_SITE_OTHER): Payer: 59

## 2024-06-20 NOTE — Care Plan (Signed)
 Shift Summary Lidocaine  was administered to address pain, and pain remained constant with a score of 3 throughout the shift. Fall prevention measures, including bed alarms and frequent visual checks, were maintained at all times with no reported falls or injuries. Skin integrity was preserved with absorbent pads and limited adhesive use, and no new skin issues developed during the shift. Blood pressure was elevated at one point and the RN was notified; glucose was found to be high during the shift. Overall, comfort and safety interventions were consistently implemented and the patient remained ambulatory with standby assistance.  Absence of Hospital-Acquired Illness or Injury: No new skin or device-related issues were documented, and absorbent pads and limited adhesive use were maintained throughout the shift to protect skin integrity. Bruising was noted at the start of the shift but no further abnormalities or interventions were required.  Optimal Comfort and Wellbeing: Lower aching pain was reported as constant and chronic, with a pain score of 3 and no change in pain status; lidocaine  was administered and comfort interventions were declined by the patient.  Absence of Infection Signs and Symptoms: Oral temperature remained within normal limits and the IV site was clean, dry, and intact with no intervention needed during the shift.  Absence of Fall and Fall-Related Injury: Fall reduction strategies, bed alarms, and hourly visual checks were consistently maintained, and toileting was performed every two hours; no falls or injuries occurred during the shift.  Improved Ability to Complete Activities of Daily Living: Standby assist and minimal assistance were provided for mobility and hygiene, with the patient using a front wheel walker and remaining ambulatory throughout the shift.

## 2024-06-20 NOTE — Discharge Summary (Signed)
 Physician Discharge Summary [facility] 2 [facility] [address] [place] [address] Dept: [phone] Loc: [phone]   Identifying Information:  Khyren Hing [birth year] [id number]  Primary Care Physician: Sampson Ethridge Kingdom, MD   Code Status: Full Code  Admit Date: 06/18/2024  Discharge Date: 06/20/2024   Discharge To: Home with Home Health and/or PT/OT  Discharge Service: HBR - HBR: Hospitalist [facility]   Discharge Attending Physician: Belvie Ozell Pearson, MD  Discharge Diagnoses:  Principal Problem:   Chest pain (POA: Yes) Active Problems:   Hypertension associated with type 2 diabetes mellitus (CMS-HCC) (POA: Yes)   Tobacco use disorder (POA: Yes)   COPD (chronic obstructive pulmonary disease) (CMS-HCC) (POA: Yes)   CKD stage 2 due to type 2 diabetes mellitus (CMS-HCC) (POA: Yes)   Gastroesophageal reflux disease (POA: Yes)   Hyperlipidemia, mixed (POA: Yes)   Coronary artery disease (POA: Yes)   Anxiety (POA: Yes)   Heart failure with recovered ejection fraction (HFrecEF) (CMS-HCC) (POA: Yes) Resolved Problems:   * No resolved hospital problems. [facility] Course:  Hospital Follow-Up [ ]  orthopedics referral [ ]  BP follow up, consider up titration  Alfonza Boateng is an 83 year old male with a history of hypertension, type II diabetes, coronary artery disease with prior NSTEMI, heart failure with preserved ejection fraction, chronic kidney disease stage 2, COPD, and hyperlipidemia who was admitted for evaluation of chest pain and bilateral arm paresthesia.  Problem-Oriented Hospitalization Summary  Chest Pain, Bilateral Arm Pain, and Paresthesia  Acute Myocardial Injury  Coronary Artery Disease He presented with two weeks of chest pain across the upper chest and neck pain radiating down both arms into the hands, associated with reduced sensation and weakness. He has a history of NSTEMI (July 2024) and moderate non-obstructive CAD without  flow-limiting lesions on prior LHC; no stents placed. He remains on clopidogrel  (allergy to ASA), statin, and metoprolol . ECG showed sinus rhythm without acute ischemia. High-sensitivity troponin was elevated (256) but stable/downtrending (223) on repeat. Similar presentations in the past have been attributed to demand ischemia in the setting of other acute illness. He was monitored on telemetry, and further workup included trending troponin, D-dimer, and consideration for stress testing and CTA to rule out PE.  Pain management included acetaminophen ; opioids were avoided due to allergy. Lidocaine  was administered for pain control during the hospitalization. His chest pain resolved at the time of admission. Unclear if chronic bronchitis was triggering this, but no wheezing or evidence of exacerbation during this hospitalization.   Cervical Myelopathy MRI of the cervical and thoracic spine was ordered due to significant sensory loss and weakness over the past 4-5 weeks, with concern for a separate musculoskeletal etiology given his history of rheumatoid arthritis and degenerative disc disease. Recommend tylenol  1000mg  TID + gabapentin  100mg  BID given pain uncontrolled. Orthopedics spine referral placed.   Lung Findings, not consistent with infection CXR showed new bilateral perihilar and lower lobe consolidations, possibly atelectasis, infection, or aspiration. He was afebrile, with normal WBC and CRP, and no respiratory distress or cough. Respiratory pathogen panel was negative. He received IV ceftriaxone  and azithromycin  in the ED, both of which were discontinued after transfer to the floor. No further antibiotics were administered.  COPD  Chronic Bronchitis Exam revealed soft crackles at the right lung base and occasional wheezes; he was breathing comfortably on room air. He continued home inhaled therapies and received PRN nebulizer treatments for dyspnea or wheezing.  Hypertension  Blood pressure  was mildly elevated during admission (  systolic 160s, diastolic 80s). He continued home antihypertensive therapy. Elevated blood pressure was noted and the RN was notified during the hospitalization. He has mostly been in 120-130s on home health notes, so no BP medication changes known.   Chronic Kidney Disease Stage 2  CKD was noted as a comorbidity; no acute changes were documented during this admission.  Type II Diabetes Mellitus  Home metformin was held during hospitalization. Glucose was monitored with POCT and sliding scale insulin . High glucose was noted during the shift and managed accordingly.  Tobacco Use Disorder  He smokes 3-4 cigarettes daily and received tobacco cessation counseling during hospitalization. Nicotine  gum/lozenge was ordered PRN, and nicotine  patch was recommended during hospitalization and at discharge. He does not plan to quit in the next 6 months.  ______________________________________________________________________ Discharge Medications:    Your Medication List     STOP taking these medications    lidocaine  4 % cream Commonly known as: LMX       START taking these medications    gabapentin  100 MG capsule Commonly known as: NEURONTIN  Take 1 capsule (100 mg total) by mouth two (2) times a day.       CHANGE how you take these medications    metFORMIN 850 MG tablet Commonly known as: GLUCOPHAGE TAKE 1 TABLET (850 MG TOTAL) BY MOUTH DAILY. FOR DIABETES What changed: See the new instructions.       CONTINUE taking these medications    acetaminophen  500 MG tablet Commonly known as: TYLENOL  Take 2 tablets (1,000 mg total) by mouth two (2) times a day as needed for pain. 04/26/24 - Pt taking 650mg , 2 pills, one-two times a day as needed.  Is not using the 500.   albuterol  2.5 mg /3 mL (0.083 %) nebulizer solution INHALE 3 ML (2.5 MG TOTAL) BY NEBULIZATION EVERY FOUR (4) HOURS AS NEEDED FOR WHEEZING.   albuterol  90 mcg/actuation  inhaler Commonly known as: PROVENTIL  HFA;VENTOLIN  HFA Inhale 2 puffs every six (6) hours as needed for wheezing or shortness of breath.   amlodipine  2.5 MG tablet Commonly known as: NORVASC  Take 2 tablets (5 mg total) by mouth daily.   ascorbic acid  500 MG tablet Generic drug: ascorbic acid  (vitamin C ) Take 1 tablet (500 mg total) by mouth every other day.   atorvastatin  80 MG tablet Commonly known as: LIPITOR Take 1 tablet (80 mg total) by mouth daily.   clopidogrel  75 mg tablet Commonly known as: PLAVIX  TAKE 1 TABLET BY MOUTH EVERY DAY   ferrous sulfate  325 (65 FE) MG tablet Take 1 tablet (325 mg total) by mouth every other day.   folic acid  1 MG tablet Commonly known as: FOLVITE  Take 1 tablet (1,000 mcg total) by mouth in the morning.   hydroxychloroquine 200 mg tablet Commonly known as: PLAQUENIL Take 1 tablet (200 mg total) by mouth daily.   inhalational spacing device Spcr 1 each by Miscellaneous route two (2) times a day.   lidocaine  4 % patch Commonly known as: ASPERCREME Place 1 patch on the skin daily as needed.   magnesium  oxide 400 mg (241.3 mg elemental) tablet Commonly known as: MAG-OX Take 1 tablet (400 mg total) by mouth daily.   metoPROLOL  succinate 25 MG 24 hr tablet Commonly known as: Toprol -XL Take 1 tablet (25 mg total) by mouth daily.   mirtazapine  15 MG tablet Commonly known as: REMERON  Take 2 tablets (30 mg total) by mouth nightly.   nitroglycerin  0.4 MG SL tablet Commonly known as: NITROSTAT  Place  1 tablet (0.4 mg total) under the tongue every five (5) minutes as needed for chest pain.   ondansetron  4 MG tablet Commonly known as: ZOFRAN  TAKE 1 TABLET BY MOUTH TWICE A DAY AS NEEDED FOR NAUSEA   ONETOUCH DELICA PLUS LANCET 33 gauge Misc Generic drug: lancets USE 1 LANCET BY OTHER ROUTE TWO (2) TIMES A DAY.   ONETOUCH ULTRA TEST Strp Generic drug: blood sugar diagnostic USE AS DIRECTED TWICE A DAY   pantoprazole  40 MG  tablet Commonly known as: Protonix  TAKE 1 TABLET BY MOUTH EVERY DAY   polyethylene glycol 17 gram/dose powder Commonly known as: GLYCOLAX Take 17 g by mouth daily as needed (constipation).   senna 8.6 mg tablet Commonly known as: SENOKOT Take 2 tablets by mouth daily as needed for constipation.   tamsulosin  0.4 mg capsule Commonly known as: FLOMAX  Take 1 capsule (0.4 mg total) by mouth in the morning.   torsemide 20 MG tablet Commonly known as: DEMADEX Take 1 tablet (20 mg total) by mouth every other day. Take 1/2 tablet every other day . Do not take if systolic pressure is under 100.   TRELEGY ELLIPTA 100-62.5-25 mcg inhaler Generic drug: fluticasone-umeclidin-vilanter Inhale 1 puff daily. Rinse mouth with water after use        Allergies: Iodinated contrast media, Ioxaglate sodium, Lisinopril , Oxycodone -acetaminophen , Codeine, and Aspirin  ______________________________________________________________________ Pending Test Results:   Most Recent Labs: All lab results last 24 hours -  Recent Results (from the past 24 hours)  POCT Glucose   Collection Time: 06/19/24  5:29 PM  Result Value Ref Range   Glucose, POC 181 (H) 70 - 179 mg/dL  POCT Glucose   Collection Time: 06/19/24 10:35 PM  Result Value Ref Range   Glucose, POC 193 (H) 70 - 179 mg/dL  POCT Glucose   Collection Time: 06/20/24  8:09 AM  Result Value Ref Range   Glucose, POC 224 (H) 70 - 179 mg/dL    Relevant Studies/Radiology: CTA Chest W Contrast Result Date: 06/20/2024 EXAM: CTA CHEST W CONTRAST ACCESSION: [id number] UN REPORT DATE: 06/20/2024 10:13 AM CLINICAL INDICATION: eval for PE, persistent chest pain, elevated Ddimer TECHNIQUE: Contiguous axial images were reconstructed through the chest following a single breath hold helical acquisition during the administration of intravenous contrast material. Images were reformatted in the coronal and sagittal planes. MIP slabs were also constructed.  COMPARISON: CT CHEST WO CONTRAST 03/24/2024 FINDINGS: PULMONARY ARTERIES: No embolus in either lung. Main pulmonary artery is normal in size. HEART/VASCULATURE: Cardiac chambers are normal in size. Mild coronary artery calcifications. No pericardial effusion. Tortuous aorta is normal in caliber with moderate calcifications. LUNGS/AIRWAYS/PLEURA: Trachea and large airways are patent. Mild paraseptal and moderate centrilobular emphysema. Diffuse bronchial wall thickening. Impacted right lower lobe segmental and subsegmental airways. Dependent atelectasis in the right lower lobe. No pleural effusion or pneumothorax. MEDIASTINUM/THORACIC INLET: No enlarged supraclavicular or intrathoracic lymph nodes. No mediastinal mass or thyroid  abnormality. UPPER ABDOMEN: Eventration of the posterior left hemidiaphragm. Normal. CHEST WALL/BONES: No enlarged axillary lymph nodes. Unremarkable bones.   *  No pulmonary embolism. *  Impacted right lower lobe segmental and subsegmental airways with dependent atelectasis could be related to aspiration. *  Mild paraseptal and moderate centrilobular emphysema. *  Diffuse bronchial wall thickening likely due to chronic bronchitis. *  Mild coronary artery atherosclerosis.   MRI Thoracic Spine W Wo Contrast Result Date: 06/19/2024 EXAM: Magnetic resonance imaging, thoracic spine, without and with contrast. DATE: 06/19/2024 9:29 AM ACCESSION: [id number] UN DICTATED: 06/19/2024 9:37  AM INTERPRETATION LOCATION: [facility] CLINICAL INDICATION: 83 years old Male with neck/back pain with numbness/weakness in upper extremities  COMPARISON: MRI lumbar spine 12/23/2020 TECHNIQUE: Multiplanar MRI was performed through the thoracic spine prior to and following intravenous contrast administration. FINDINGS: T1/T2 hyperintense lesion in the T T6 vertebral body with enhancement, likely atypical or lipid poor hemangioma. No aggressive osseous lesions. Normal signal in the spinal cord. The vertebral  bodies are normally aligned. Mild multilevel disc height loss and disc desiccation. No significant spinal canal or neural foraminal narrowing. The paraspinal tissues are within normal limits. Multiple left renal cysts. 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   No evidence of myelopathy. No high-grade spinal canal or neuroforaminal stenosis.   MRI Cervical Spine W Wo Contrast Result Date: 06/19/2024 EXAM: Magnetic resonance imaging, spinal canal and contents, cervical without and with contrast material. DATE: 06/19/2024 9:30 AM ACCESSION: [id number] UN DICTATED: 06/19/2024 9:32 AM INTERPRETATION LOCATION: [facility] CLINICAL INDICATION: 83 years old Male with pain in neck with bilateral neuropathy and weakness  COMPARISON: None TECHNIQUE: Multiplanar MRI was performed through the cervical spine without and with intravenous contrast. FINDINGS: Scattered multilevel degenerative fatty marrow endplate changes. Increased STIR signal within the cervical cord at the level of C3-C4, C4-C5, and C5-C6. There is no abnormal enhancement. The vertebral bodies are normally aligned. Multilevel disc desiccation with loss of disc height. Degenerative endplate signal changes at C5-C6. Mild endplate edema along the superior endplate of C7 with associated small Schmorl node Mild thickening of the posterior atlantoaxial membrane resulting in mild spinal canal narrowing at the level of C2 (2:9). C2-C3: No spinal canal narrowing. Asymmetric disc bulge to the left and left uncovertebral hypertrophy with facet degeneration resulting in mild left neural foraminal narrowing. C3-C4: Diffuse disc bulge with superiorly migrated central disc extrusion, moderate left/mild right facet arthropathy and ligamentum flavum hypertrophy contributing to severe canal stenosis and compression of the spinal cord with myelopathic cord signal abnormality. Severe bilateral neural foraminal narrowing, left greater than  right. C4-C5: Diffuse disc bulge with moderate left and mild right facet arthropathy and ligamentum flavum hypertrophy contributing to moderate canal stenosis. Moderate bilateral neural foraminal narrowing. C5-C6: Disc osteophyte complex with diffuse disc bulge and left greater than right uncovertebral hypertrophy contributing to moderate canal stenosis. Bilateral facet arthropathy. Severe bilateral neural foraminal narrowing, left greater than right. C6-C7: Diffuse disc bulge, mild uncovertebral hypertrophy, mild bilateral facet arthropathy, and ligamentum flavum hypertrophy contributing to moderate canal stenosis. Severe right, moderate left neural foraminal narrowing. C7-T1: No spinal canal narrowing. No neural foraminal narrowing. The paraspinal tissues are within normal limits.   1.  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 2.  Multilevel severe bilateral foraminal stenosis.   ECG 12 Lead Result Date: 06/19/2024 SINUS RHYTHM WITH FREQUENT PREMATURE VENTRICULAR BEATS OTHERWISE NORMAL ECG WHEN COMPARED WITH ECG OF 28-Mar-2024 16:40, MANUAL COMPARISON REQUIRED PREVIOUS ECG IS INCOMPATIBLE  ECG 12 Lead Result Date: 06/19/2024 NORMAL SINUS RHYTHM NORMAL ECG WHEN COMPARED WITH ECG OF 18-Jun-2024 20:05, PREMATURE VENTRICULAR BEATS ARE NO LONGER PRESENT  ECG 12 Lead Result Date: 06/19/2024 NORMAL SINUS RHYTHM NONSPECIFIC T WAVE ABNORMALITY PROLONGED QT ABNORMAL ECG WHEN COMPARED WITH ECG OF 18-Jun-2024 22:05, NO SIGNIFICANT CHANGE WAS FOUND  XR Chest 2 views Result Date: 06/18/2024 EXAM: XR CHEST 2 VIEWS DATE: 06/18/2024 9:23 PM ACCESSION: [id number] UN DICTATED: 06/18/2024 9:46 PM INTERPRETATION LOCATION: MAIN [facility]AMP[facility] CLINICAL INDICATION: 83 years old Male with CHEST PAIN ; Chest Pain  TECHNIQUE: 2 views of the chest COMPARISON: Chest radiograph 03/23/2024 FINDINGS: Lung: New bilateral perihilar and lower lobe consolidation. Pleura: No pleural  effusion or pneumothorax identified. Mediastinum: The cardiomediastinal silhouette appears accentuated, however this is secondary to patient rotation. Bones: No evidence of acute osseous abnormality is identified.   New bilateral perihilar and lower lobe consolidation, which may represent aspiration or infection with likely a component of atelectasis.   ______________________________________________________________________ Discharge Instructions:  Activity Instructions     Activity as tolerated           Resources and Referrals   Home health has been set up for through the agency listed below. The Home health agency will be contacting you to set up a time for them to come see you in your home within 2 days of your discharge.  If you have not heard from them prior to 06/21/24 or you have any questions about home health, please contact them at the phone number listed below.  Home Care [facility]  - Admitted Since 06/18/2024     Service Provider Services Address Phone Fax Patient Preferred   [facility] - [facility] [address] [place] [address] [phone] --             Follow Up instructions and Outpatient Referrals    Ambulatory Referral to Home Health     Reason for referral: [facility]   Is this a [facility] or [facility] Patient?: Yes   Home Health Options: Traditional Home Health   Is this patient at high risk for COVID 19 transmission and recommended to  stay at home during this pandemic?: No   If the patient has a diagnosis of heart failure and is already on oral  diuretics, do you want to activate the in home IV Lasix protocol?: No   Do you want agency provider parameter notifications or patient specific  provider parameter notifications?: Agency   Do you want to initiate remote patient monitoring?: Yes   Physician to follow patient's care: PCP   Disciplines requested:  Physical Therapy Occupational  Therapy Home Health Aide     Physical Therapy requested: Evaluate and treat   Occupational Therapy Requested: Evaluate and treat   Requested SOC Date:  Comment - 48 hours after discharge   Ambulatory Referral to Orthopedics     Reason for referral: spine evaluation for cervical myelopathy   Specific Service Requested: Non-Joint   Choose Non-Joint: Spine   Is this referral for a new fracture?: No   Requested follow up plan: You would evaluate and manage.   Discharge instructions       Appointments which have been scheduled for you    Jun 26, 2024 To Be Determined PTA - HOME VISIT with Franky JONELLE Bristol, PTA [facility] HOSPICE [facility]) [address] [place] [address] [phone]     Jul 02, 2024 To Be Determined PT - REASSESSMENT with Suzen JAYSON Spar, PT [facility] HOSPICE [facility]) [address] [place] [address] [phone]        ______________________________________________________________________ Discharge Day Services: BP 167/101   Pulse 88   Temp 36.6 C (97.9 F) (Oral)   Resp 17   Ht 170.2 cm (5' 7)   Wt 75.3 kg (165 lb 14.4 oz)   SpO2 97%   BMI 25.98  kg/m   Pt seen on the day of discharge and determined appropriate for discharge.  Condition at Discharge: stable  Length of Discharge: I spent greater than 30 mins in the discharge of this patient.

## 2024-06-28 NOTE — Telephone Encounter (Signed)
 RPM dashboard reviewed. Patient oxygen  level is out of agency parameter times 3 today. No reports of SOB.  PulseOx  89 89 89  Agency Standard Parameters:  Notify for Systolic BP less than 90 or greater than 180  Notify for Diastolic BP less than 60 or greater than 110  Notify for Heart Rate less than 50 bpm or greater than 120 bpm  Notify for O2 sats less than 90% at rest

## 2024-07-08 ENCOUNTER — Other Ambulatory Visit: Payer: Self-pay

## 2024-07-08 ENCOUNTER — Emergency Department
Admission: EM | Admit: 2024-07-08 | Discharge: 2024-07-08 | Disposition: A | Discharge status: Home / Self Care | Attending: Emergency Medicine | Admitting: Emergency Medicine

## 2024-07-08 ENCOUNTER — Emergency Department

## 2024-07-08 DIAGNOSIS — K257 Chronic gastric ulcer without hemorrhage or perforation: Secondary | ICD-10-CM | POA: Insufficient documentation

## 2024-07-08 DIAGNOSIS — R0602 Shortness of breath: Secondary | ICD-10-CM | POA: Diagnosis present

## 2024-07-08 DIAGNOSIS — I2510 Atherosclerotic heart disease of native coronary artery without angina pectoris: Secondary | ICD-10-CM | POA: Diagnosis not present

## 2024-07-08 DIAGNOSIS — J690 Pneumonitis due to inhalation of food and vomit: Secondary | ICD-10-CM | POA: Diagnosis not present

## 2024-07-08 DIAGNOSIS — R110 Nausea: Secondary | ICD-10-CM | POA: Diagnosis not present

## 2024-07-08 DIAGNOSIS — I252 Old myocardial infarction: Secondary | ICD-10-CM | POA: Insufficient documentation

## 2024-07-08 DIAGNOSIS — R42 Dizziness and giddiness: Secondary | ICD-10-CM | POA: Diagnosis present

## 2024-07-08 LAB — URINALYSIS, ROUTINE W REFLEX MICROSCOPIC
Bilirubin Urine: NEGATIVE
Glucose, UA: NEGATIVE mg/dL
Hgb urine dipstick: NEGATIVE
Ketones, ur: NEGATIVE mg/dL
Leukocytes,Ua: NEGATIVE
Nitrite: NEGATIVE
Protein, ur: NEGATIVE mg/dL
Specific Gravity, Urine: 1.017 (ref 1.005–1.030)
pH: 6 (ref 5.0–8.0)

## 2024-07-08 LAB — RESP PANEL BY RT-PCR (RSV, FLU A&B, COVID)  RVPGX2
Influenza A by PCR: NEGATIVE
Influenza B by PCR: NEGATIVE
Resp Syncytial Virus by PCR: NEGATIVE
SARS Coronavirus 2 by RT PCR: NEGATIVE

## 2024-07-08 LAB — COMPREHENSIVE METABOLIC PANEL WITH GFR
ALT: 11 U/L (ref 0–44)
AST: 15 U/L (ref 15–41)
Albumin: 3.8 g/dL (ref 3.5–5.0)
Alkaline Phosphatase: 52 U/L (ref 38–126)
Anion gap: 10 (ref 5–15)
BUN: 15 mg/dL (ref 8–23)
CO2: 29 mmol/L (ref 22–32)
Calcium: 8.5 mg/dL — ABNORMAL LOW (ref 8.9–10.3)
Chloride: 99 mmol/L (ref 98–111)
Creatinine, Ser: 1.11 mg/dL (ref 0.61–1.24)
GFR, Estimated: >60 mL/min (ref >60)
Glucose, Bld: 144 mg/dL — ABNORMAL HIGH (ref 70–99)
Potassium: 3.8 mmol/L (ref 3.5–5.1)
Sodium: 138 mmol/L (ref 135–145)
Total Bilirubin: 0.5 mg/dL (ref 0.0–1.2)
Total Protein: 6.7 g/dL (ref 6.5–8.1)

## 2024-07-08 LAB — BRAIN NATRIURETIC PEPTIDE: B Natriuretic Peptide: 47.2 pg/mL (ref 0.0–100.0)

## 2024-07-08 LAB — TROPONIN I (HIGH SENSITIVITY)
Troponin I (High Sensitivity): 24 ng/L — ABNORMAL HIGH (ref <18)
Troponin I (High Sensitivity): 23 ng/L — ABNORMAL HIGH (ref <18)

## 2024-07-08 LAB — CBC
HCT: 39.5 % (ref 39.0–52.0)
Hemoglobin: 12.6 g/dL — ABNORMAL LOW (ref 13.0–17.0)
MCH: 27.2 pg (ref 26.0–34.0)
MCHC: 31.9 g/dL (ref 30.0–36.0)
MCV: 85.3 fL (ref 80.0–100.0)
Platelets: 133 K/uL — ABNORMAL LOW (ref 150–400)
RBC: 4.63 MIL/uL (ref 4.22–5.81)
RDW: 13.9 % (ref 11.5–15.5)
WBC: 7.4 K/uL (ref 4.0–10.5)
nRBC: 0 % (ref 0.0–0.2)

## 2024-07-08 MED ORDER — AMOXICILLIN-POT CLAVULANATE 875-125 MG PO TABS: 1.0000 | ORAL_TABLET | Freq: Two times a day (BID) | ORAL | 0 refills | Status: AC

## 2024-07-08 MED ORDER — METHYLPREDNISOLONE SODIUM SUCC 40 MG IJ SOLR
40.0000 mg | Freq: Once | INTRAMUSCULAR | Status: AC
  Administered 2024-07-08: 40 mg via INTRAVENOUS
  Filled 2024-07-08: qty 1

## 2024-07-08 MED ORDER — IOHEXOL 350 MG/ML SOLN
100.0000 mL | Freq: Once | INTRAVENOUS | Status: AC | PRN
  Administered 2024-07-08: 100 mL via INTRAVENOUS

## 2024-07-08 MED ORDER — IPRATROPIUM-ALBUTEROL 0.5-2.5 (3) MG/3ML IN SOLN
3.0000 mL | Freq: Once | RESPIRATORY_TRACT | Status: AC
  Administered 2024-07-08: 3 mL via RESPIRATORY_TRACT
  Filled 2024-07-08: qty 3

## 2024-07-08 MED ORDER — METOPROLOL SUCCINATE ER 25 MG PO TB24
25.0000 mg | ORAL_TABLET | ORAL | Status: AC
  Administered 2024-07-08: 25 mg via ORAL
  Filled 2024-07-08: qty 1

## 2024-07-08 MED ORDER — DIPHENHYDRAMINE HCL 50 MG/ML IJ SOLN
50.0000 mg | Freq: Once | INTRAMUSCULAR | Status: AC
  Administered 2024-07-08: 50 mg via INTRAVENOUS
  Filled 2024-07-08: qty 1

## 2024-07-08 MED ORDER — AMLODIPINE BESYLATE 5 MG PO TABS
2.5000 mg | ORAL_TABLET | Freq: Once | ORAL | Status: AC
  Administered 2024-07-08: 2.5 mg via ORAL
  Filled 2024-07-08: qty 1

## 2024-07-08 MED ORDER — DIPHENHYDRAMINE HCL 25 MG PO CAPS
50.0000 mg | ORAL_CAPSULE | Freq: Once | ORAL | Status: AC
  Filled 2024-07-08: qty 2

## 2024-07-08 NOTE — ED Notes (Signed)
 Pt given warm blankets and updated on plan

## 2024-07-08 NOTE — ED Notes (Signed)
 Pt assisted with urinal

## 2024-07-08 NOTE — ED Provider Notes (Signed)
 [facility] Provider Note    Event Date/Time   First MD Initiated Contact with Patient 07/08/24 1049     (approximate)   History   Dizziness   HPI  [name] is a 83 y.o. male reports he has been doing fairly well since he left [facility] earlier this month.  Patient reports the last 2 days though he started noticed a little short of breath when he lays down at night or walks, and has had this slight cough bring up a little bit of occasional white sputum.  Not any pain.  Continues to have a numb feeling across his shoulders and arms but that is been present since he was at [facility].  No worsening of his weakness, uses a walker at baseline.  Feels a bit fatigued but mostly reports concern that he is feeling short of breath the last 2 nights starting to cough.  No fever or chills.  Has not noticed any swelling.  He typically takes a water pill each morning, has not had that or his normal medications yet today but has been otherwise taking them as prescribed  No chest pain.  No abdominal pain.  No nausea or vomiting.  No swelling.   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 He remains on clopidogrel  (allergy to ASA), statin, and metoprolol . ECG showed sinus rhythm without acute ischemia. High-sensitivity troponin was elevated (256) but stable/downtrending (223) on repeat. Similar presentations in the past have been attributed to demand ischemia in the setting of other acute illness. He was monitored on telemetry, and further workup included trending troponin, D-dimer, and consideration for stress testing and CTA to rule out PE.  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  Also noted he has chronic abnormal chest imaging, initially was treated for possible chest infection but later found to have no findings suggest acute infection while  at [facility].  Physical Exam   Triage Vital Signs: ED Triage Vitals  Encounter Vitals Group     BP 07/08/24 1046 (!) 175/96     Girls Systolic BP Percentile --      Girls Diastolic BP Percentile --      Boys Systolic BP Percentile --      Boys Diastolic BP Percentile --      Pulse Rate 07/08/24 1046 78     Resp 07/08/24 1046 (!) 30     Temp 07/08/24 1046 98 F (36.7 C)     Temp src --      SpO2 07/08/24 1046 94 %     Weight 07/08/24 1048 161 lb (73 kg)     Height 07/08/24 1048 5' 7 (1.702 m)     Head Circumference --      Peak Flow --      Pain Score 07/08/24 1045 0     Pain Loc --      Pain Education --      Exclude from Growth Chart --     Most recent vital signs: Vitals:   07/08/24 1830 07/08/24 1845  BP:    Pulse: 83 88  Resp: 17 16  Temp:    SpO2: 97% 92%     General: Awake, no distress.  Pleasant. CV:  Good peripheral perfusion.  Normal tones and rate Resp:  Normal effort.  Speaks in full clear sentences.  At triage was tachypneic, but currently  respirating 16/min without distress.  Lung fields without wheezing, but has slight crackles in the bases bilaterally and occasional central minimal rhonchi  In no distress speaking full clear sentences on room air with normal oxygen  percentage Abd:  No distention.  Soft nontender nondistended Other:  No lower extremity edema venous cords or congestion.   ED Results / Procedures / Treatments   Labs (all labs ordered are listed, but only abnormal results are displayed) Labs Reviewed  COMPREHENSIVE METABOLIC PANEL WITH GFR - Abnormal; Notable for the following components:      Result Value   Glucose, Bld 144 (*)    Calcium  8.5 (*)    All other components within normal limits  CBC - Abnormal; Notable for the following components:   Hemoglobin 12.6 (*)    Platelets 133 (*)    All other components within normal limits  URINALYSIS, ROUTINE W REFLEX MICROSCOPIC - Abnormal; Notable for the following components:   Color,  Urine YELLOW (*)    APPearance CLEAR (*)    All other components within normal limits  TROPONIN I (HIGH SENSITIVITY) - Abnormal; Notable for the following components:   Troponin I (High Sensitivity) 24 (*)    All other components within normal limits  TROPONIN I (HIGH SENSITIVITY) - Abnormal; Notable for the following components:   Troponin I (High Sensitivity) 23 (*)    All other components within normal limits  RESP PANEL BY RT-PCR (RSV, FLU A&B, COVID)  RVPGX2  BRAIN NATRIURETIC PEPTIDE  CBG MONITORING, ED     EKG  Inter by me at 1050 heart rate 80 QRS 100 QTc 460 Normal sinus rhythm, occasional PVC.  Mild nonspecific T wave abnormality slight flattening.  No evidence of frank ischemia noted.   RADIOLOGY  CT Angio Chest PE W and/or Wo Contrast Result Date: 07/08/2024 CLINICAL DATA:  Chest pain and nausea, initial encounter EXAM: CT ANGIOGRAPHY CHEST CT ABDOMEN AND PELVIS WITH CONTRAST TECHNIQUE: Multidetector CT imaging of the chest was performed using the standard protocol during bolus administration of intravenous contrast. Multiplanar CT image reconstructions and MIPs were obtained to evaluate the vascular anatomy. Multidetector CT imaging of the abdomen and pelvis was performed using the standard protocol during bolus administration of intravenous contrast. RADIATION DOSE REDUCTION: This exam was performed according to the departmental dose-optimization program which includes automated exposure control, adjustment of the mA and/or kV according to patient size and/or use of iterative reconstruction technique. CONTRAST:  OMNIPAQUE  IOHEXOL  350 MG/ML SOLN. 4 hour emergent contrast prep was administered due to underlying allergy. COMPARISON:  03/15/2024 FINDINGS: CTA CHEST FINDINGS Cardiovascular: Thoracic aorta and its branches demonstrate atherosclerotic calcifications. No aneurysmal dilatation or dissection is identified. Heart is mildly enlarged in size. The pulmonary artery  shows a normal branching pattern bilaterally. No intraluminal filling defect to suggest pulmonary embolism is noted. Mild coronary calcifications are seen. Mediastinum/Nodes: Thoracic inlet is unremarkable. No hilar or mediastinal adenopathy is noted. The esophagus as visualized is within normal limits. Lungs/Pleura: Mild basilar atelectasis is seen. Some inspissated material is noted in the lower lobe bronchial tree bilaterally. No focal confluent infiltrate is seen. No sizable parenchymal nodule is noted. Mild emphysematous changes are seen. Musculoskeletal: Mild degenerative changes of the thoracic spine. No acute rib abnormality is noted. Review of the MIP images confirms the above findings. CT ABDOMEN and PELVIS FINDINGS Hepatobiliary: Gallbladder is well distended. No cholelithiasis is seen. Liver is within normal limits. Pancreas: Unremarkable. No pancreatic ductal dilatation or surrounding inflammatory changes. Spleen:  Spleen demonstrates multiple globular calcification stable in appearance from the prior exam. Adrenals/Urinary Tract: Adrenal glands are within normal limits. Kidneys demonstrate cystic changes bilaterally. No follow-up is recommended. No renal calculi or obstructive changes are seen. Delayed images demonstrate normal excretion. The bladder is partially distended. Stomach/Bowel: The appendix is air-filled and within normal limits. No obstructive or inflammatory changes of the colon are seen. Stomach is decompressed with wall thickening although a focal area of wall thinning is noted posteriorly in the antrum just before the pyloric channel suspicious for ulcer crater. Small bowel is within normal limits. Vascular/Lymphatic: Aortic atherosclerosis. No enlarged abdominal or pelvic lymph nodes. Reproductive: Prostate is unremarkable. Other: No abdominal wall hernia or abnormality. No abdominopelvic ascites. Musculoskeletal: No acute or significant osseous findings. Review of the MIP images  confirms the above findings. IMPRESSION: CTA of the chest: No evidence of pulmonary embolism. Inspissated material with basilar atelectasis which may be related to aspiration CT of the abdomen and pelvis: Thinning in the gastric antrum posteriorly suspicious for focal ulcer crater. No evidence of perforation is noted. No other focal abnormality is noted Electronically Signed   By: Oneil Devonshire M.D.   On: 07/08/2024 19:12   CT ABDOMEN PELVIS W CONTRAST Result Date: 07/08/2024 CLINICAL DATA:  Chest pain and nausea, initial encounter EXAM: CT ANGIOGRAPHY CHEST CT ABDOMEN AND PELVIS WITH CONTRAST TECHNIQUE: Multidetector CT imaging of the chest was performed using the standard protocol during bolus administration of intravenous contrast. Multiplanar CT image reconstructions and MIPs were obtained to evaluate the vascular anatomy. Multidetector CT imaging of the abdomen and pelvis was performed using the standard protocol during bolus administration of intravenous contrast. RADIATION DOSE REDUCTION: This exam was performed according to the departmental dose-optimization program which includes automated exposure control, adjustment of the mA and/or kV according to patient size and/or use of iterative reconstruction technique. CONTRAST:  OMNIPAQUE  IOHEXOL  350 MG/ML SOLN. 4 hour emergent contrast prep was administered due to underlying allergy. COMPARISON:  03/15/2024 FINDINGS: CTA CHEST FINDINGS Cardiovascular: Thoracic aorta and its branches demonstrate atherosclerotic calcifications. No aneurysmal dilatation or dissection is identified. Heart is mildly enlarged in size. The pulmonary artery shows a normal branching pattern bilaterally. No intraluminal filling defect to suggest pulmonary embolism is noted. Mild coronary calcifications are seen. Mediastinum/Nodes: Thoracic inlet is unremarkable. No hilar or mediastinal adenopathy is noted. The esophagus as visualized is within normal limits. Lungs/Pleura: Mild  basilar atelectasis is seen. Some inspissated material is noted in the lower lobe bronchial tree bilaterally. No focal confluent infiltrate is seen. No sizable parenchymal nodule is noted. Mild emphysematous changes are seen. Musculoskeletal: Mild degenerative changes of the thoracic spine. No acute rib abnormality is noted. Review of the MIP images confirms the above findings. CT ABDOMEN and PELVIS FINDINGS Hepatobiliary: Gallbladder is well distended. No cholelithiasis is seen. Liver is within normal limits. Pancreas: Unremarkable. No pancreatic ductal dilatation or surrounding inflammatory changes. Spleen: Spleen demonstrates multiple globular calcification stable in appearance from the prior exam. Adrenals/Urinary Tract: Adrenal glands are within normal limits. Kidneys demonstrate cystic changes bilaterally. No follow-up is recommended. No renal calculi or obstructive changes are seen. Delayed images demonstrate normal excretion. The bladder is partially distended. Stomach/Bowel: The appendix is air-filled and within normal limits. No obstructive or inflammatory changes of the colon are seen. Stomach is decompressed with wall thickening although a focal area of wall thinning is noted posteriorly in the antrum just before the pyloric channel suspicious for ulcer crater. Small bowel is within normal  limits. Vascular/Lymphatic: Aortic atherosclerosis. No enlarged abdominal or pelvic lymph nodes. Reproductive: Prostate is unremarkable. Other: No abdominal wall hernia or abnormality. No abdominopelvic ascites. Musculoskeletal: No acute or significant osseous findings. Review of the MIP images confirms the above findings. IMPRESSION: CTA of the chest: No evidence of pulmonary embolism. Inspissated material with basilar atelectasis which may be related to aspiration CT of the abdomen and pelvis: Thinning in the gastric antrum posteriorly suspicious for focal ulcer crater. No evidence of perforation is noted. No other  focal abnormality is noted Electronically Signed   By: Oneil Devonshire M.D.   On: 07/08/2024 19:12   DG Chest 2 View Result Date: 07/08/2024 CLINICAL DATA:  Cough, shortness of breath. EXAM: CHEST - 2 VIEW COMPARISON:  04/19/2024 and CT chest 03/15/2024. FINDINGS: Trachea is midline. Heart size stable. Streaky volume loss at the left lung base adjacent to an eventrated left hemidiaphragm. No pleural fluid. IMPRESSION: Streaky volume loss in the left lung base, adjacent to an eventrated left hemidiaphragm. No acute findings. Electronically Signed   By: Newell Eke M.D.   On: 07/08/2024 12:59      PROCEDURES:  Critical Care performed: No  Procedures   MEDICATIONS ORDERED IN ED: Medications  amLODipine  (NORVASC ) tablet 2.5 mg (2.5 mg Oral Given 07/08/24 1125)  metoprolol  succinate (TOPROL -XL) 24 hr tablet 25 mg (25 mg Oral Given 07/08/24 1125)  ipratropium-albuterol  (DUONEB) 0.5-2.5 (3) MG/3ML nebulizer solution 3 mL (3 mLs Nebulization Given 07/08/24 1357)  methylPREDNISolone  sodium succinate (SOLU-MEDROL ) 40 mg/mL injection 40 mg (40 mg Intravenous Given 07/08/24 1418)  diphenhydrAMINE  (BENADRYL ) capsule 50 mg ( Oral See Alternative 07/08/24 1746)    Or  diphenhydrAMINE  (BENADRYL ) injection 50 mg (50 mg Intravenous Given 07/08/24 1746)  iohexol  (OMNIPAQUE ) 350 MG/ML injection 100 mL (100 mLs Intravenous Contrast Given 07/08/24 1833)     IMPRESSION / MDM / ASSESSMENT AND PLAN / ED COURSE  I reviewed the triage vital signs and the nursing notes.                              Differential diagnosis includes, but is not limited to, CHF exacerbation, pneumonia though seemingly less likely with afebrile status no fevers or obvious infectious symptoms, COPD exacerbation though currently no wheezing, less likely ACS, NSTEMI demand ischemia, etc.  No findings that would be highly suggestive of thromboembolism no pleuritic pain no significant tachycardia, no acute hypoxia.  Patient denies  any associated chest pain.  His clinical findings seem suggestive of likely noninfectious cause possibly CHF with slight rales and a history of CHF.  He does not appear in acute distress or extremis.  He does not appear obviously volume overloaded looking at his periphery but he does have slight rales cough with occasional slight white sputum and his symptoms have been most present at night when he lays flat.  Patient's presentation is most consistent with acute complicated illness / injury requiring diagnostic workup.      Clinical Course as of 07/08/24 1944  Mon Jul 08, 2024  1312 Chest x-ray without acute finding. [MQ]  1312 Patient is BNP is normal arguing against decompensated CHF.  Chest x-ray shows no evidence of acute vascular overload or pulmonary edema either. [MQ]  1402 Patient does report dyspnea his lungs appear quite clear on chest x-ray, no noted wheezing.  He was recently hospitalized raising risk for thromboembolism.  He also reports nausea mild now.  Overall well-appearing and workup  thus far reassuring.  Given his concern of dyspnea though I discussed with the patient he reports that he had itching and small hives once after his CT scan but does fine if he is treated with steroids and Benadryl .  I have ordered pretreatment.  We will time imaging and obtain CT PE study and given the nausea also obtain abdominal images simultaneous.  Patient agreeable.  Pretreatment ordered, and updated CT tech of need for pretreatment [MQ]  1815 Repeat troponin without elevation.  History of mild chronic troponinemia.  No acute departure today, doubt ACS.  Awaiting CT imaging [MQ]    Clinical Course User Index [MQ] Dicky Anes, MD   ----------------------------------------- 7:42 PM on 07/08/2024 -----------------------------------------  CT Angio Chest PE W and/or Wo Contrast Result Date: 07/08/2024 CLINICAL DATA:  Chest pain and nausea, initial encounter EXAM: CT ANGIOGRAPHY CHEST CT  ABDOMEN AND PELVIS WITH CONTRAST TECHNIQUE: Multidetector CT imaging of the chest was performed using the standard protocol during bolus administration of intravenous contrast. Multiplanar CT image reconstructions and MIPs were obtained to evaluate the vascular anatomy. Multidetector CT imaging of the abdomen and pelvis was performed using the standard protocol during bolus administration of intravenous contrast. RADIATION DOSE REDUCTION: This exam was performed according to the departmental dose-optimization program which includes automated exposure control, adjustment of the mA and/or kV according to patient size and/or use of iterative reconstruction technique. CONTRAST:  OMNIPAQUE  IOHEXOL  350 MG/ML SOLN. 4 hour emergent contrast prep was administered due to underlying allergy. COMPARISON:  03/15/2024 FINDINGS: CTA CHEST FINDINGS Cardiovascular: Thoracic aorta and its branches demonstrate atherosclerotic calcifications. No aneurysmal dilatation or dissection is identified. Heart is mildly enlarged in size. The pulmonary artery shows a normal branching pattern bilaterally. No intraluminal filling defect to suggest pulmonary embolism is noted. Mild coronary calcifications are seen. Mediastinum/Nodes: Thoracic inlet is unremarkable. No hilar or mediastinal adenopathy is noted. The esophagus as visualized is within normal limits. Lungs/Pleura: Mild basilar atelectasis is seen. Some inspissated material is noted in the lower lobe bronchial tree bilaterally. No focal confluent infiltrate is seen. No sizable parenchymal nodule is noted. Mild emphysematous changes are seen. Musculoskeletal: Mild degenerative changes of the thoracic spine. No acute rib abnormality is noted. Review of the MIP images confirms the above findings. CT ABDOMEN and PELVIS FINDINGS Hepatobiliary: Gallbladder is well distended. No cholelithiasis is seen. Liver is within normal limits. Pancreas: Unremarkable. No pancreatic ductal dilatation  or surrounding inflammatory changes. Spleen: Spleen demonstrates multiple globular calcification stable in appearance from the prior exam. Adrenals/Urinary Tract: Adrenal glands are within normal limits. Kidneys demonstrate cystic changes bilaterally. No follow-up is recommended. No renal calculi or obstructive changes are seen. Delayed images demonstrate normal excretion. The bladder is partially distended. Stomach/Bowel: The appendix is air-filled and within normal limits. No obstructive or inflammatory changes of the colon are seen. Stomach is decompressed with wall thickening although a focal area of wall thinning is noted posteriorly in the antrum just before the pyloric channel suspicious for ulcer crater. Small bowel is within normal limits. Vascular/Lymphatic: Aortic atherosclerosis. No enlarged abdominal or pelvic lymph nodes. Reproductive: Prostate is unremarkable. Other: No abdominal wall hernia or abnormality. No abdominopelvic ascites. Musculoskeletal: No acute or significant osseous findings. Review of the MIP images confirms the above findings. IMPRESSION: CTA of the chest: No evidence of pulmonary embolism. Inspissated material with basilar atelectasis which may be related to aspiration CT of the abdomen and pelvis: Thinning in the gastric antrum posteriorly suspicious for focal ulcer crater. No  evidence of perforation is noted. No other focal abnormality is noted Electronically Signed   By: Oneil Devonshire M.D.   On: 07/08/2024 19:12   CT ABDOMEN PELVIS W CONTRAST Result Date: 07/08/2024 CLINICAL DATA:  Chest pain and nausea, initial encounter EXAM: CT ANGIOGRAPHY CHEST CT ABDOMEN AND PELVIS WITH CONTRAST TECHNIQUE: Multidetector CT imaging of the chest was performed using the standard protocol during bolus administration of intravenous contrast. Multiplanar CT image reconstructions and MIPs were obtained to evaluate the vascular anatomy. Multidetector CT imaging of the abdomen and pelvis was  performed using the standard protocol during bolus administration of intravenous contrast. RADIATION DOSE REDUCTION: This exam was performed according to the departmental dose-optimization program which includes automated exposure control, adjustment of the mA and/or kV according to patient size and/or use of iterative reconstruction technique. CONTRAST:  OMNIPAQUE  IOHEXOL  350 MG/ML SOLN. 4 hour emergent contrast prep was administered due to underlying allergy. COMPARISON:  03/15/2024 FINDINGS: CTA CHEST FINDINGS Cardiovascular: Thoracic aorta and its branches demonstrate atherosclerotic calcifications. No aneurysmal dilatation or dissection is identified. Heart is mildly enlarged in size. The pulmonary artery shows a normal branching pattern bilaterally. No intraluminal filling defect to suggest pulmonary embolism is noted. Mild coronary calcifications are seen. Mediastinum/Nodes: Thoracic inlet is unremarkable. No hilar or mediastinal adenopathy is noted. The esophagus as visualized is within normal limits. Lungs/Pleura: Mild basilar atelectasis is seen. Some inspissated material is noted in the lower lobe bronchial tree bilaterally. No focal confluent infiltrate is seen. No sizable parenchymal nodule is noted. Mild emphysematous changes are seen. Musculoskeletal: Mild degenerative changes of the thoracic spine. No acute rib abnormality is noted. Review of the MIP images confirms the above findings. CT ABDOMEN and PELVIS FINDINGS Hepatobiliary: Gallbladder is well distended. No cholelithiasis is seen. Liver is within normal limits. Pancreas: Unremarkable. No pancreatic ductal dilatation or surrounding inflammatory changes. Spleen: Spleen demonstrates multiple globular calcification stable in appearance from the prior exam. Adrenals/Urinary Tract: Adrenal glands are within normal limits. Kidneys demonstrate cystic changes bilaterally. No follow-up is recommended. No renal calculi or obstructive changes are  seen. Delayed images demonstrate normal excretion. The bladder is partially distended. Stomach/Bowel: The appendix is air-filled and within normal limits. No obstructive or inflammatory changes of the colon are seen. Stomach is decompressed with wall thickening although a focal area of wall thinning is noted posteriorly in the antrum just before the pyloric channel suspicious for ulcer crater. Small bowel is within normal limits. Vascular/Lymphatic: Aortic atherosclerosis. No enlarged abdominal or pelvic lymph nodes. Reproductive: Prostate is unremarkable. Other: No abdominal wall hernia or abnormality. No abdominopelvic ascites. Musculoskeletal: No acute or significant osseous findings. Review of the MIP images confirms the above findings. IMPRESSION: CTA of the chest: No evidence of pulmonary embolism. Inspissated material with basilar atelectasis which may be related to aspiration CT of the abdomen and pelvis: Thinning in the gastric antrum posteriorly suspicious for focal ulcer crater. No evidence of perforation is noted. No other focal abnormality is noted Electronically Signed   By: Oneil Devonshire M.D.   On: 07/08/2024 19:12   DG Chest 2 View Result Date: 07/08/2024 CLINICAL DATA:  Cough, shortness of breath. EXAM: CHEST - 2 VIEW COMPARISON:  04/19/2024 and CT chest 03/15/2024. FINDINGS: Trachea is midline. Heart size stable. Streaky volume loss at the left lung base adjacent to an eventrated left hemidiaphragm. No pleural fluid. IMPRESSION: Streaky volume loss in the left lung base, adjacent to an eventrated left hemidiaphragm. No acute findings. Electronically Signed  By: Newell Eke M.D.   On: 07/08/2024 12:59    Patient resting comfortably no rash itching or concerns after CT.    CT imaging demonstrating findings of possibly just small amount of aspiration and no and the bibasilar spaces.  Potentially also associate with atelectasis.  No acute major lung findings but given the patient's history  I will treat him with Augmentin in the event he has small amount of aspiration pneumonia.  Additionally imaging of the abdomen shows findings concerning for a potential gastric ulcer.  Discussed with the patient need for follow-up with GI for further evaluation.  I did discuss with him and he is currently taking a PPI and Zofran  as needed, he actually reports that he has had nausea of this nature intermittently for about 6 months.  Suspicious that he may have chronic ulcer.  No active bleeding though.  Discussed with both patient and his son careful return precautions treatment with Augmentin and he will continue previously prescribed Zofran  which he takes at home as well as his PPI.  Careful return precautions discussed with patient and son are agreeable with will follow-up with his PCP as well as GI  He is resting comfortably without distress comfortable with plan for discharge.  Return precautions and treatment recommendations and follow-up discussed with the patient who is agreeable with the plan.   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   Dicky Anes, MD 07/08/24 [phone]

## 2024-07-08 NOTE — ED Notes (Signed)
 Pt assisted with urinal. Pt assisted back into bed and on monitor. Breathing treatment started

## 2024-07-08 NOTE — ED Notes (Signed)
 Pt back from CT

## 2024-07-08 NOTE — Discharge Instructions (Signed)
 Though I am not certain I am suspicious that your nausea may be from a developing ulcer in your stomach.  I think you need close follow-up with a gastroenterologist, please talk to your primary care doctor about this and also call and make an appointment with our gastroenterology department.  Please continue you use of Zofran  as prescribed and continue your pantoprazole .   Additionally it appears that you probably have a small amount of pneumonia developing possibly from a small amount of aspiration in your right lung.  Please use Augmentin as prescribed.  Follow-up closely with your primary care doctor after today's visit.  Return to the ER right away if you have chest pain, difficulty breathing, begin having severe abdominal pain, vomiting blood, weakness or other concerns or symptoms arise.

## 2024-07-08 NOTE — ED Notes (Signed)
 Report given to [facility].RN

## 2024-07-08 NOTE — ED Notes (Signed)
 Pt taken to CT.

## 2024-07-08 NOTE — ED Triage Notes (Signed)
 Pt comes via [facility] from home with c/o dizziness and nausea since yesterday. Pt also states sob yesterday. Pt has hx of copd. 96 % RA with [facility] 20 g IV and pt given 4 zofran .   167/95 CBG 174 85 96 % RA

## 2024-08-30 ENCOUNTER — Other Ambulatory Visit: Payer: Self-pay | Admitting: Internal Medicine

## 2024-08-30 DIAGNOSIS — R058 Other specified cough: Secondary | ICD-10-CM

## 2024-09-06 ENCOUNTER — Ambulatory Visit
Admission: RE | Admit: 2024-09-06 | Discharge: 2024-09-06 | Disposition: A | Discharge status: Home / Self Care | Source: Ambulatory Visit | Attending: Internal Medicine | Admitting: Internal Medicine

## 2024-09-06 ENCOUNTER — Ambulatory Visit
Admission: RE | Admit: 2024-09-06 | Discharge: 2024-09-06 | Disposition: A | Source: Ambulatory Visit | Attending: Internal Medicine | Admitting: Internal Medicine

## 2024-09-06 DIAGNOSIS — R058 Other specified cough: Secondary | ICD-10-CM | POA: Diagnosis present
